# Patient Record
Sex: Female | Born: 1970
Health system: Southern US, Community
[De-identification: ages and names within clinical notes are randomized; demographics above are authoritative.]

## PROBLEM LIST (undated history)

## (undated) ENCOUNTER — Emergency Department (HOSPITAL_COMMUNITY): Admission: EM | Payer: Self-pay

## (undated) DIAGNOSIS — M1990 Unspecified osteoarthritis, unspecified site: Secondary | ICD-10-CM

## (undated) HISTORY — PX: TONSILLECTOMY: SUR1361

## (undated) HISTORY — PX: TUBAL LIGATION: SHX77

## (undated) HISTORY — PX: ABDOMINOPLASTY/PANNICULECTOMY: SHX5578

## (undated) HISTORY — PX: WISDOM TOOTH EXTRACTION: SHX21

## (undated) HISTORY — PX: CHOLECYSTECTOMY: SHX55

---

## 2000-05-10 ENCOUNTER — Other Ambulatory Visit: Admission: RE | Admit: 2000-05-10 | Discharge: 2000-05-10 | Payer: Self-pay | Admitting: Obstetrics and Gynecology

## 2000-11-30 ENCOUNTER — Emergency Department (HOSPITAL_COMMUNITY): Admission: EM | Admit: 2000-11-30 | Discharge: 2000-11-30 | Payer: Self-pay | Admitting: Emergency Medicine

## 2001-05-22 ENCOUNTER — Other Ambulatory Visit: Admission: RE | Admit: 2001-05-22 | Discharge: 2001-05-22 | Payer: Self-pay | Admitting: Obstetrics and Gynecology

## 2002-07-25 ENCOUNTER — Other Ambulatory Visit: Admission: RE | Admit: 2002-07-25 | Discharge: 2002-07-25 | Payer: Self-pay | Admitting: Obstetrics and Gynecology

## 2002-12-31 ENCOUNTER — Encounter: Payer: Self-pay | Admitting: Emergency Medicine

## 2002-12-31 ENCOUNTER — Emergency Department (HOSPITAL_COMMUNITY): Admission: EM | Admit: 2002-12-31 | Discharge: 2002-12-31 | Payer: Self-pay | Admitting: Emergency Medicine

## 2003-09-23 ENCOUNTER — Other Ambulatory Visit: Admission: RE | Admit: 2003-09-23 | Discharge: 2003-09-23 | Payer: Self-pay | Admitting: Obstetrics and Gynecology

## 2004-09-20 ENCOUNTER — Inpatient Hospital Stay (HOSPITAL_COMMUNITY): Admission: AD | Admit: 2004-09-20 | Discharge: 2004-09-21 | Payer: Self-pay | Admitting: Obstetrics & Gynecology

## 2004-09-25 ENCOUNTER — Inpatient Hospital Stay (HOSPITAL_COMMUNITY): Admission: AD | Admit: 2004-09-25 | Discharge: 2004-09-25 | Payer: Self-pay | Admitting: Obstetrics & Gynecology

## 2004-09-29 ENCOUNTER — Inpatient Hospital Stay (HOSPITAL_COMMUNITY): Admission: AD | Admit: 2004-09-29 | Discharge: 2004-10-01 | Payer: Self-pay | Admitting: Obstetrics and Gynecology

## 2004-09-29 ENCOUNTER — Encounter (INDEPENDENT_AMBULATORY_CARE_PROVIDER_SITE_OTHER): Payer: Self-pay | Admitting: *Deleted

## 2004-11-04 ENCOUNTER — Other Ambulatory Visit: Admission: RE | Admit: 2004-11-04 | Discharge: 2004-11-04 | Payer: Self-pay | Admitting: Obstetrics and Gynecology

## 2004-11-16 ENCOUNTER — Ambulatory Visit (HOSPITAL_COMMUNITY): Admission: RE | Admit: 2004-11-16 | Discharge: 2004-11-16 | Payer: Self-pay | Admitting: Obstetrics and Gynecology

## 2005-09-17 ENCOUNTER — Emergency Department (HOSPITAL_COMMUNITY): Admission: EM | Admit: 2005-09-17 | Discharge: 2005-09-17 | Payer: Self-pay | Admitting: Family Medicine

## 2007-07-24 ENCOUNTER — Emergency Department (HOSPITAL_COMMUNITY): Admission: EM | Admit: 2007-07-24 | Discharge: 2007-07-24 | Payer: Self-pay | Admitting: Emergency Medicine

## 2009-05-19 ENCOUNTER — Emergency Department (HOSPITAL_COMMUNITY): Admission: EM | Admit: 2009-05-19 | Discharge: 2009-05-19 | Payer: Self-pay | Admitting: Emergency Medicine

## 2009-05-27 ENCOUNTER — Ambulatory Visit: Payer: Self-pay | Admitting: Pulmonary Disease

## 2009-05-27 DIAGNOSIS — G4733 Obstructive sleep apnea (adult) (pediatric): Secondary | ICD-10-CM

## 2009-05-29 ENCOUNTER — Ambulatory Visit (HOSPITAL_BASED_OUTPATIENT_CLINIC_OR_DEPARTMENT_OTHER): Admission: RE | Admit: 2009-05-29 | Discharge: 2009-05-29 | Payer: Self-pay | Admitting: Pulmonary Disease

## 2009-05-29 ENCOUNTER — Encounter: Payer: Self-pay | Admitting: Pulmonary Disease

## 2009-06-14 ENCOUNTER — Ambulatory Visit: Payer: Self-pay | Admitting: Pulmonary Disease

## 2009-06-15 ENCOUNTER — Telehealth (INDEPENDENT_AMBULATORY_CARE_PROVIDER_SITE_OTHER): Payer: Self-pay | Admitting: *Deleted

## 2010-05-25 NOTE — Assessment & Plan Note (Signed)
Summary: self referral for nonrestorative sleep.   Copy to:  Self Referral Primary Provider/Referring Provider:  None  CC:  Sleep Consult.  History of Present Illness: The pt is a 40y/o female who I have been asked to see for nonrestorative sleep.  She has had issues for a few years, but it has gotten signficantly worse since Dec. of last year.  She has been noted to have loud snoring during sleep, but no one has mentioned pauses.  She does have intermittant choking arousals.  She goes to bed btw 8-10pm, and arises at 5:30am to start her day.  She does not feel rested in the am's upon arising, no matter how long she sleeps.  She also notes frequent awakenings, and feels that she is always aware of her surroundings during sleep.  She denies any hx suggestive of RLS.  She works as a Runner, broadcasting/film/video, and has very little "down time".  However, she clearly has sleep pressure with periods of inactivity during the day.  She also can doze with tv or movies in the evening.  She denies any sleepiness issues with driving.  Her epworth score today is 11, and she tells me that her weight is up about 10 pounds over the past 2 years.  Preventive Screening-Counseling & Management  Alcohol-Tobacco     Smoking Status: never  Current Medications (verified): 1)  None  Allergies (verified): No Known Drug Allergies  Past History:  Past Medical History: none per pt  Past Surgical History: gallbladder 1993  Family History: Reviewed history and no changes required. heart disease: father (m.i.)  Social History: Reviewed history and no changes required. Patient never smoked.  pt is single. pt has children. pt works as a Runner, broadcasting/film/video.  Smoking Status:  never  Review of Systems       The patient complains of difficulty swallowing, sore throat, and nasal congestion/difficulty breathing through nose.  The patient denies shortness of breath with activity, shortness of breath at rest, productive cough, non-productive  cough, coughing up blood, chest pain, irregular heartbeats, acid heartburn, indigestion, loss of appetite, weight change, abdominal pain, tooth/dental problems, headaches, sneezing, itching, ear ache, anxiety, depression, hand/feet swelling, joint stiffness or pain, rash, change in color of mucus, and fever.    Vital Signs:  Patient profile:   40 year old female Height:      65 inches Weight:      272.25 pounds BMI:     45.47 O2 Sat:      99 % on Room air Temp:     98.0 degrees F oral Pulse rate:   75 / minute BP sitting:   110 / 70  (left arm) Cuff size:   large  Vitals Entered By: Arman Filter LPN (May 27, 2009 3:27 PM)  O2 Flow:  Room air CC: Sleep Consult Comments Medications reviewed with patient Arman Filter LPN  May 27, 2009 3:27 PM    Physical Exam  General:  obese female in nad Eyes:  PERRLA and EOMI.   Nose:  patent without discharge Mouth:  narrowing posteriorly, elongation of soft palate and uvula Neck:  no jvd, tmg, LN Lungs:  clear to auscultation Heart:  rrr, no mrg Abdomen:  soft and nontender,bs + Extremities:  no edema or cyanosis pulses intact distally Neurologic:  alert and oriented, moves all 4.   Impression & Recommendations:  Problem # 1:  OBSTRUCTIVE SLEEP APNEA (ICD-327.23) the pt's history is very suggestive of osa.  She is obese,  has loud snoring with choking arousals, and has nonrestorative sleep.  I have discussed with her the pathophysiology of osa, including its impact on her QOL and CV health.  I think she needs a sleep study for diagnosis, and would also check TSH given her recent worsening of symptoms.  She is agreeable to this approach.  Medications Added to Medication List This Visit: 1)  None   Other Orders: TLB-TSH (Thyroid Stimulating Hormone) (02725-DGU) New Patient Level IV (44034) Sleep Disorder Referral (Sleep Disorder)  Patient Instructions: 1)  will setup for sleep study, and call when results are  available 2)  will check thyroid studies today

## 2010-05-25 NOTE — Progress Notes (Signed)
Summary: need to sched ov with kc   ---- Converted from flag ---- ---- 06/15/2009 1:56 PM, Arman Filter LPN wrote: pt needs ov with kc to discuss sleep study results. ------------------------------  [id number].  Aundra Millet Jodi Criscuolo LPN  June 15, 2009 1:56 PM   called and spoke with pt.  pt scheduled to see kc friday 06-19-2009 at 3 :30 pm. Arman Filter LPN  June 16, 2009 3:30 PM

## 2010-06-23 ENCOUNTER — Other Ambulatory Visit (HOSPITAL_COMMUNITY): Payer: Self-pay | Admitting: Surgery

## 2010-06-30 ENCOUNTER — Encounter: Payer: BC Managed Care – PPO | Attending: Surgery | Admitting: *Deleted

## 2010-06-30 ENCOUNTER — Ambulatory Visit (HOSPITAL_COMMUNITY)
Admission: RE | Admit: 2010-06-30 | Discharge: 2010-06-30 | Disposition: A | Discharge status: Home / Self Care | Payer: BC Managed Care – PPO | Source: Ambulatory Visit | Attending: Surgery | Admitting: Surgery

## 2010-06-30 DIAGNOSIS — Z713 Dietary counseling and surveillance: Secondary | ICD-10-CM | POA: Insufficient documentation

## 2010-06-30 DIAGNOSIS — Z01818 Encounter for other preprocedural examination: Secondary | ICD-10-CM | POA: Insufficient documentation

## 2010-07-05 ENCOUNTER — Other Ambulatory Visit (HOSPITAL_COMMUNITY): Payer: Self-pay

## 2010-07-05 ENCOUNTER — Ambulatory Visit (HOSPITAL_COMMUNITY): Payer: BC Managed Care – PPO

## 2010-07-06 ENCOUNTER — Ambulatory Visit (HOSPITAL_COMMUNITY)
Admit: 2010-07-06 | Discharge: 2010-07-06 | Disposition: A | Discharge status: Home / Self Care | Payer: BC Managed Care – PPO | Attending: Surgery | Admitting: Surgery

## 2010-07-06 DIAGNOSIS — Z6841 Body Mass Index (BMI) 40.0 and over, adult: Secondary | ICD-10-CM | POA: Insufficient documentation

## 2010-07-06 DIAGNOSIS — K219 Gastro-esophageal reflux disease without esophagitis: Secondary | ICD-10-CM | POA: Insufficient documentation

## 2010-08-26 ENCOUNTER — Encounter: Payer: BC Managed Care – PPO | Attending: Surgery

## 2010-08-26 DIAGNOSIS — Z713 Dietary counseling and surveillance: Secondary | ICD-10-CM | POA: Insufficient documentation

## 2010-08-26 DIAGNOSIS — Z01818 Encounter for other preprocedural examination: Secondary | ICD-10-CM | POA: Insufficient documentation

## 2010-09-10 NOTE — Op Note (Signed)
Sandra Bird, Sandra Bird              ACCOUNT NO.:  [id number]   MEDICAL RECORD NO.:  [id number]          PATIENT TYPE:  AMB   LOCATION:  SDC                           FACILITY:  [facility]   PHYSICIAN:  Randye Lobo, M.D.   DATE OF BIRTH:  March 23, 1971   DATE OF PROCEDURE:  11/16/2004  DATE OF DISCHARGE:                                 OPERATIVE REPORT   PREOPERATIVE DIAGNOSIS:  1.  Multiparous female.  2.  Desire for permanent sterilization.   POSTOPERATIVE DIAGNOSIS:  1.  Multiparous female.  2.  Desire for permanent sterilization.   PROCEDURE:  Laparoscopic bilateral tubal ligation by fulguration.   ANESTHESIA:  General endotracheal, local with 0.25% Marcaine.   IV FLUIDS:  1000 mL Ringer's lactate.   ESTIMATED BLOOD LOSS:  Minimal.   URINE OUTPUT:  100 mL by I&O catheterization prior to procedure.   COMPLICATIONS:  None.   SURGEON:  Randye Lobo, M.D.   INDICATIONS FOR PROCEDURE:  The patient is a 40 year old para 3 African-  American female, status post normal spontaneous vaginal delivery on October 01, 2003 who during her pregnancy and postpartum requested permanent  sterilization. The patient declined reversible forms of contraception. The  patient chose to proceed with permanent sterilization after risks, benefits,  and alternatives were discussed with her. A failure rate of approximately 1  in 250 to 1 in 300 of the procedure was discussed with the patient along  with potential risk of ectopic pregnancy. She again chose to proceed with  the surgery.   FINDINGS:  At the time of laparoscopy, the patient had a normal-appearing  uterus, bilateral tubes, and ovaries. The patient had a normal-appearing  appendix and liver. There was no evidence of any adhesive disease nor  endometriosis visible in the abdomen or pelvis.   SPECIMENS:  None.   PROCEDURE:  The patient was identified in the preoperative hold area. She  did receive clindamycin 900 milligrams IV for antibiotic  prophylaxis. The  patient in the operating room received general endotracheal. She was then  placed in the dorsal lithotomy position. The abdomen and vagina were  sterilely prepped and the patient was catheterized of urine with a red  rubber catheter. She was sterilely draped.   A 1 cm umbilical incision was created sharply with the scalpel along the  line of a previous umbilical incision. This was carried down to the fascia  using an Allis clamp. A 10 mm trocar was then inserted directly into the  peritoneal cavity without difficulty. The laparoscope confirmed proper  placement and a pneumoperitoneum was then achieved with CO2 gas. The patient  was placed in a Trendelenburg position. A 5 mm suprapubic incision was then  created with the scalpel, and a 5 mm trocar was placed under direct  visualization of laparoscope. An inspection of the pelvic and abdominal  organs was performed and the findings were as noted above.   The left fallopian tube was grasped and followed all the way to its  fimbriated end. It was then coagulated with the Kleppinger bipolar forceps  along a contiguous length  of 3 cm along the isthmic portion of the fallopian  tube. There was excellent blanching of the fallopian tube extending into the  mesosalpinx. The same procedure that was performed on the left-hand side was  then repeated on the right-hand side after that fallopian tube was grasped  and followed all the way to its fimbriated end. Hemostasis was excellent at  the termination of the procedure. The suprapubic trocar was removed under  visualization of the laparoscope. The pneumoperitoneum was released and then  the 10 mm laparoscope and the umbilical trocar were removed simultaneously.   All incisions were closed with subcuticular sutures of 3-0 plain. The  incisions were covered with Steri-Strips and Band-Aids. The Hulka tenaculum  which had been placed at the beginning of the procedure was removed.    The patient was awakened and extubated and taken to the recovery room in  stable and awake condition. There were no complications to the procedure.  All needle, instrument, sponge counts were correct.      Randye Lobo, M.D.  Electronically Signed     BES/MEDQ  D:  11/16/2004  T:  11/16/2004  Job:  [id number]

## 2011-01-17 LAB — INFLUENZA A AND B ANTIGEN (CONVERTED LAB)

## 2011-03-26 ENCOUNTER — Ambulatory Visit (HOSPITAL_COMMUNITY): Admission: RE | Admit: 2011-03-26 | Payer: BC Managed Care – PPO | Source: Ambulatory Visit | Admitting: Surgery

## 2011-04-20 ENCOUNTER — Encounter: Payer: Self-pay | Admitting: *Deleted

## 2011-04-20 ENCOUNTER — Emergency Department (HOSPITAL_COMMUNITY): Payer: BC Managed Care – PPO

## 2011-04-20 ENCOUNTER — Other Ambulatory Visit: Payer: Self-pay

## 2011-04-20 ENCOUNTER — Emergency Department (HOSPITAL_COMMUNITY)
Admission: EM | Admit: 2011-04-20 | Discharge: 2011-04-21 | Disposition: A | Discharge status: Home / Self Care | Payer: BC Managed Care – PPO | Attending: Emergency Medicine | Admitting: Emergency Medicine

## 2011-04-20 DIAGNOSIS — R0602 Shortness of breath: Secondary | ICD-10-CM | POA: Insufficient documentation

## 2011-04-20 DIAGNOSIS — R05 Cough: Secondary | ICD-10-CM | POA: Insufficient documentation

## 2011-04-20 DIAGNOSIS — R079 Chest pain, unspecified: Secondary | ICD-10-CM | POA: Insufficient documentation

## 2011-04-20 DIAGNOSIS — J189 Pneumonia, unspecified organism: Secondary | ICD-10-CM | POA: Insufficient documentation

## 2011-04-20 DIAGNOSIS — R059 Cough, unspecified: Secondary | ICD-10-CM | POA: Insufficient documentation

## 2011-04-20 DIAGNOSIS — E669 Obesity, unspecified: Secondary | ICD-10-CM | POA: Insufficient documentation

## 2011-04-20 DIAGNOSIS — IMO0001 Reserved for inherently not codable concepts without codable children: Secondary | ICD-10-CM | POA: Insufficient documentation

## 2011-04-20 LAB — DIFFERENTIAL
Basophils Absolute: 0 10*3/uL (ref 0.0–0.1)
Eosinophils Absolute: 0 10*3/uL (ref 0.0–0.7)
Lymphocytes Relative: 6 % — ABNORMAL LOW (ref 12–46)
Lymphs Abs: 1.4 10*3/uL (ref 0.7–4.0)
Neutrophils Relative %: 90 % — ABNORMAL HIGH (ref 43–77)

## 2011-04-20 LAB — CBC
MCH: 26.8 pg (ref 26.0–34.0)
Platelets: 277 10*3/uL (ref 150–400)
RBC: 4.51 MIL/uL (ref 3.87–5.11)
WBC: 23.2 10*3/uL — ABNORMAL HIGH (ref 4.0–10.5)

## 2011-04-20 LAB — BASIC METABOLIC PANEL
GFR calc non Af Amer: >90 mL/min (ref >90)
Glucose, Bld: 115 mg/dL — ABNORMAL HIGH (ref 70–99)
Potassium: 3 mEq/L — ABNORMAL LOW (ref 3.5–5.1)
Sodium: 132 mEq/L — ABNORMAL LOW (ref 135–145)

## 2011-04-20 MED ORDER — ACETAMINOPHEN 325 MG PO TABS
650.0000 mg | ORAL_TABLET | Freq: Once | ORAL | Status: AC
  Administered 2011-04-20: 650 mg via ORAL

## 2011-04-20 MED ORDER — IOHEXOL 300 MG/ML  SOLN
100.0000 mL | Freq: Once | INTRAMUSCULAR | Status: AC | PRN
  Administered 2011-04-20: 100 mL via INTRAVENOUS

## 2011-04-20 MED ORDER — AZITHROMYCIN 250 MG PO TABS: ORAL_TABLET | ORAL | Status: AC

## 2011-04-20 MED ORDER — ALBUTEROL SULFATE (5 MG/ML) 0.5% IN NEBU
5.0000 mg | INHALATION_SOLUTION | Freq: Once | RESPIRATORY_TRACT | Status: AC
  Administered 2011-04-20: 5 mg via RESPIRATORY_TRACT
  Filled 2011-04-20: qty 1

## 2011-04-20 MED ORDER — ACETAMINOPHEN 325 MG PO TABS
ORAL_TABLET | ORAL | Status: AC
  Administered 2011-04-20: 650 mg via ORAL
  Filled 2011-04-20: qty 2

## 2011-04-20 MED ORDER — IPRATROPIUM BROMIDE 0.02 % IN SOLN
0.5000 mg | Freq: Once | RESPIRATORY_TRACT | Status: AC
  Administered 2011-04-20: 0.5 mg via RESPIRATORY_TRACT
  Filled 2011-04-20: qty 2.5

## 2011-04-20 MED ORDER — SODIUM CHLORIDE 0.9 % IV BOLUS (SEPSIS)
1000.0000 mL | Freq: Once | INTRAVENOUS | Status: AC
  Administered 2011-04-20: 1000 mL via INTRAVENOUS

## 2011-04-20 MED ORDER — CEFTRIAXONE SODIUM 1 G IJ SOLR
1.0000 g | Freq: Once | INTRAMUSCULAR | Status: AC
  Administered 2011-04-20: 1 g via INTRAVENOUS
  Filled 2011-04-20: qty 10

## 2011-04-20 MED ORDER — POTASSIUM CHLORIDE CRYS ER 20 MEQ PO TBCR
40.0000 meq | EXTENDED_RELEASE_TABLET | Freq: Once | ORAL | Status: AC
  Administered 2011-04-21: 40 meq via ORAL
  Filled 2011-04-20: qty 2

## 2011-04-20 MED ORDER — DEXTROSE 5 % IV SOLN
500.0000 mg | Freq: Once | INTRAVENOUS | Status: AC
  Administered 2011-04-21: 500 mg via INTRAVENOUS
  Filled 2011-04-20: qty 500

## 2011-04-20 NOTE — ED Notes (Signed)
Pt states "I've had a cough x 1 month, was told @ [facility] she may have a sinus infection"; pt c/o mid-sternal cp, with radiation into left neck/LUE/left back, pt denies n/v

## 2011-04-20 NOTE — ED Provider Notes (Signed)
History     CSN: [id number]  Arrival date & time 04/20/11  1735   First MD Initiated Contact with Patient 04/20/11 2006      Chief Complaint  Patient presents with  . Cough     HPI  History provided by the patient and spouse. Patient is a 40 year old female who presents with complaints of persistent cough for the past month. Patient has had associated congestion and nasal drainage. Patient reports symptoms becoming much worse 2 days ago with fever, chills, bodyaches and chest pains. Chest pain is worse with coughing, laughing or deep breathing. Pain runs through the left upper chest and left shoulder and neck. Pain as a soreness and cramping. Pain was better with muscle rub and heat. Patient has also been taking TheraFlu and over-the-counter cough medicines with no improvement of cough symptoms. 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 Patient denies any other significant past medical history.    History reviewed. No pertinent past medical history.  Past Surgical History  Procedure Date  . Cholecystectomy   . Tuval ligation   . Tonsillectomy     No family history on file.  History  Substance Use Topics  . Smoking status: Never Smoker   . Smokeless tobacco: Not on file  . Alcohol Use: Yes     socially    OB History    Grav Para Term Preterm Abortions TAB SAB Ect Mult Living                  Review of Systems  Constitutional: Positive for fever, chills, appetite change and fatigue.  HENT: Positive for congestion, sore throat and rhinorrhea.   Respiratory: Positive for cough. Negative for shortness of breath.   Cardiovascular: Positive for chest pain. Negative for palpitations.  Gastrointestinal: Negative for vomiting, abdominal pain, diarrhea and constipation.  Genitourinary: Negative for dysuria, frequency, hematuria, flank pain and pelvic pain.  Musculoskeletal: Positive for myalgias.  Neurological: Negative for  dizziness and light-headedness.  All other systems reviewed and are negative.    Allergies  Penicillins  Home Medications   Current Outpatient Rx  Name Route Sig Dispense Refill  . CALCIUM + D PO Oral Take 1 tablet by mouth daily.      . ADULT MULTIVITAMIN W/MINERALS CH Oral Take 1 tablet by mouth daily.        BP 137/58  Pulse 106  Temp(Src) 101.3 F (38.5 C) (Oral)  Resp 22  Wt 213 lb (96.616 kg)  SpO2 98%  LMP 03/31/2011  Physical Exam  Nursing note and vitals reviewed. Constitutional: She is oriented to person, place, and time. She appears well-developed and well-nourished. No distress.  HENT:  Head: Normocephalic.  Mouth/Throat: Oropharynx is clear and moist.       Rhinorrhea  Neck: Normal range of motion. Neck supple.       No meningeal sign  Cardiovascular: Regular rhythm.  Tachycardia present.   Pulmonary/Chest: Breath sounds normal. Tachypnea noted. She has no wheezes. She has no rales.       Reproducible left upper chest, trapezius and shoulder tenderness to palpation. No deformity, crepitus or step-off of ribs.  Abdominal: Soft. There is no tenderness. There is no rebound and no guarding.       Obese  Musculoskeletal: She exhibits no edema and no tenderness.  Neurological: She is alert and oriented to person, place, and time.  Skin: Skin is warm and dry. No rash noted.  Psychiatric: She has a normal mood and affect. Her behavior is normal.    ED Course  Procedures (including critical care time)   Labs Reviewed  CBC  DIFFERENTIAL  BASIC METABOLIC PANEL   Results for orders placed during the hospital encounter of 04/20/11  CBC      Component Value Range   WBC 23.2 (*) 4.0 - 10.5 (K/uL)   RBC 4.51  3.87 - 5.11 (MIL/uL)   Hemoglobin 12.1  12.0 - 15.0 (g/dL)   HCT 78.2 (*) 95.6 - 46.0 (%)   MCV 79.2  78.0 - 100.0 (fL)   MCH 26.8  26.0 - 34.0 (pg)   MCHC 33.9  30.0 - 36.0 (g/dL)   RDW 21.3  08.6 - 57.8 (%)   Platelets 277  150 - 400 (K/uL)    DIFFERENTIAL      Component Value Range   Neutrophils Relative 90 (*) 43 - 77 (%)   Neutro Abs 20.8 (*) 1.7 - 7.7 (K/uL)   Lymphocytes Relative 6 (*) 12 - 46 (%)   Lymphs Abs 1.4  0.7 - 4.0 (K/uL)   Monocytes Relative 4  3 - 12 (%)   Monocytes Absolute 1.0  0.1 - 1.0 (K/uL)   Eosinophils Relative 0  0 - 5 (%)   Eosinophils Absolute 0.0  0.0 - 0.7 (K/uL)   Basophils Relative 0  0 - 1 (%)   Basophils Absolute 0.0  0.0 - 0.1 (K/uL)  BASIC METABOLIC PANEL      Component Value Range   Sodium 132 (*) 135 - 145 (mEq/L)   Potassium 3.0 (*) 3.5 - 5.1 (mEq/L)   Chloride 98  96 - 112 (mEq/L)   CO2 23  19 - 32 (mEq/L)   Glucose, Bld 115 (*) 70 - 99 (mg/dL)   BUN 9  6 - 23 (mg/dL)   Creatinine, Ser 4.69  0.50 - 1.10 (mg/dL)   Calcium 9.7  8.4 - 62.9 (mg/dL)   GFR calc non Af Amer >90  >90 (mL/min)   GFR calc Af Amer >90  >90 (mL/min)      Dg Chest 2 View  04/20/2011  *RADIOLOGY REPORT*  Clinical Data: Cough, chest pain, shortness of breath  CHEST - 2 VIEW  Comparison: 07/06/2010  Findings: Lungs are essentially clear.  No pleural effusion or pneumothorax.  Heart is top normal in size for inspiration.  Visualized osseous structures are within normal limits.  IMPRESSION: No evidence of acute cardiopulmonary disease.  Original Report Authenticated By: Charline Bills, M.D.   Ct Angio Chest W/cm &/or Wo Cm  04/20/2011  *RADIOLOGY REPORT*  Clinical Data:  Cough for 1 month; chest pain.  CT ANGIOGRAPHY CHEST WITH CONTRAST  Technique:  Multidetector CT imaging of the chest was performed using the standard protocol during bolus administration of intravenous contrast.  Multiplanar CT image reconstructions including MIPs were obtained to evaluate the vascular anatomy.  Contrast: OMNIPAQUE IOHEXOL 300 MG/ML IV SOLN  Comparison:  Chest radiograph performed earlier today at 08:35 p.m.  Findings:  There is no evidence of significant pulmonary embolus. Evaluation for pulmonary embolus is suboptimal in  areas of airspace consolidation.  Dense consolidation involving the left lower lobe, compatible with left lower lobe pneumonia.  Mild patchy airspace opacity within the right lower lobe may reflect atelectasis or pneumonia.  Trace bilateral pleural effusions are noted.  There is no evidence of pneumothorax.  No masses are identified; no abnormal focal contrast enhancement is seen.  The mediastinum is  unremarkable in appearance.  No mediastinal lymphadenopathy is seen.  No pericardial effusion is identified. The great vessels are unremarkable in appearance.  No axillary lymphadenopathy is seen.  The visualized portions of the thyroid gland are unremarkable in appearance.  The visualized portions of the liver and spleen are unremarkable.  No acute osseous abnormalities are seen.  Review of the MIP images confirms the above findings.  IMPRESSION:  1.  No evidence of significant pulmonary embolus. 2.  Dense left lower lobe pneumonia noted; additional mild patchy airspace opacity within the right lower lobe may reflect atelectasis or pneumonia. 3.  Trace bilateral pleural effusions seen.  Original Report Authenticated By: Tonia Ghent, M.D.     1. CAP (community acquired pneumonia)       MDM  8:20PM patient seen and evaluated. Patient in no acute distress.   Pt continues to feel improvements.  CT with signs for PNA.  Will start tx here in ED.  Pt continues to maintain good O2 sats and normal respirations.  IV fluids have improved HR.  Pt feels ready to return home.  Pt advised of strict return instructions.     Angus Seller, [place] 04/21/11 [phone]

## 2011-04-20 NOTE — ED Notes (Signed)
Patient is resting comfortably.  Pt to BR

## 2011-04-20 NOTE — ED Notes (Signed)
States had a flu test at [facility]--neg per patient, left shoulder/chest/neck pain started yesterday--aching, hurts with coughing/movement. States coughing up blood tinged phlegm,  Wet, moist cough, .

## 2011-04-23 NOTE — ED Provider Notes (Signed)
Medical screening examination/treatment/procedure(s) were performed by non-physician practitioner and as supervising physician I was immediately available for consultation/collaboration.   Gerhard Munch, MD 04/23/11 [phone]

## 2012-02-04 ENCOUNTER — Encounter (HOSPITAL_COMMUNITY): Payer: Self-pay | Admitting: Emergency Medicine

## 2012-02-04 ENCOUNTER — Emergency Department (INDEPENDENT_AMBULATORY_CARE_PROVIDER_SITE_OTHER)
Admission: EM | Admit: 2012-02-04 | Discharge: 2012-02-04 | Disposition: A | Discharge status: Home / Self Care | Payer: BC Managed Care – PPO | Source: Home / Self Care

## 2012-02-04 DIAGNOSIS — J029 Acute pharyngitis, unspecified: Secondary | ICD-10-CM

## 2012-02-04 DIAGNOSIS — J329 Chronic sinusitis, unspecified: Secondary | ICD-10-CM

## 2012-02-04 LAB — POCT RAPID STREP A: Streptococcus, Group A Screen (Direct): NEGATIVE

## 2012-02-04 MED ORDER — AZITHROMYCIN 250 MG PO TABS: ORAL_TABLET | ORAL | Status: DC

## 2012-02-04 NOTE — ED Notes (Signed)
Pt c/o of sore throat, productive cough with bloody sputum, runny nose x3 days. Pt has tried thera flu, nyquil, and tylenol severe cold with no relief of symptoms. Pt denies n/v/d and fever.

## 2012-02-04 NOTE — ED Provider Notes (Signed)
History     CSN: [id number]  Arrival date & time 02/04/12  4098   None     Chief Complaint  Patient presents with  . Sore Throat    (Consider location/radiation/quality/duration/timing/severity/associated sxs/prior treatment) Patient is a 41 y.o. female presenting with pharyngitis. The history is provided by the patient. No language interpreter was used.  Sore Throat This is a new problem. The current episode started more than 2 days ago. The problem occurs constantly. The problem has been gradually worsening. Pertinent negatives include no shortness of breath. Nothing aggravates the symptoms. Nothing relieves the symptoms. She has tried nothing for the symptoms.  Pt reports sinus drainage, bloody  History reviewed. No pertinent past medical history.  Past Surgical History  Procedure Date  . Cholecystectomy   . Tuval ligation   . Tonsillectomy     History reviewed. No pertinent family history.  History  Substance Use Topics  . Smoking status: Never Smoker   . Smokeless tobacco: Not on file  . Alcohol Use: Yes     socially    OB History    Grav Para Term Preterm Abortions TAB SAB Ect Mult Living                  Review of Systems  Constitutional: Positive for fever.  HENT: Positive for congestion and rhinorrhea.   Respiratory: Negative for shortness of breath.   All other systems reviewed and are negative.    Allergies  Penicillins  Home Medications   Current Outpatient Rx  Name Route Sig Dispense Refill  . CALCIUM + D PO Oral Take 1 tablet by mouth daily.      . ADULT MULTIVITAMIN W/MINERALS CH Oral Take 1 tablet by mouth daily.        BP 143/74  Pulse 71  Temp 98.3 F (36.8 C) (Oral)  Resp 20  SpO2 100%  LMP 02/02/2012  Physical Exam  Constitutional: She is oriented to person, place, and time. She appears well-developed and well-nourished.  HENT:  Head: Normocephalic.       Erythematous throat,  Tender maxillary sinuses  Eyes:  Conjunctivae normal are normal. Pupils are equal, round, and reactive to light.  Neck: Normal range of motion. Neck supple.  Cardiovascular: Normal rate.   Pulmonary/Chest: Effort normal.  Abdominal: Soft.  Musculoskeletal: Normal range of motion.  Neurological: She is alert and oriented to person, place, and time. She has normal reflexes.  Skin: Skin is warm.  Psychiatric: She has a normal mood and affect.    ED Course  Procedures (including critical care time)   Labs Reviewed  POCT RAPID STREP A ([facility] CARE ONLY)   No results found.   1. Pharyngitis   2. Sinusitis       MDM  zithromax        Elson Areas, [place] 02/04/12 1045

## 2012-02-06 NOTE — ED Provider Notes (Signed)
Medical screening examination/treatment/procedure(s) were performed by non-physician practitioner and as supervising physician I was immediately available for consultation/collaboration.  Leslee Home, M.D.   Reuben Likes, MD 02/06/12 [phone]

## 2012-08-08 ENCOUNTER — Telehealth (HOSPITAL_COMMUNITY): Payer: Self-pay | Admitting: Radiology

## 2012-08-08 NOTE — Telephone Encounter (Signed)
Received call from patient's daughter stating that the stitch on patient's drain has come out.  Patient denies pain or fevers.  Patient due to have drain exchanged on 08-15-12.  I told the daughter that they could choose between coming in to have stitch replaced or waiting until next appointment.  Daughter said she would call back if they wanted to replace the stitch.  I instructed her that they needed to take extra care of the catheter and secure well with tape to protect catheter from getting pulled out.

## 2012-08-12 DIAGNOSIS — R7301 Impaired fasting glucose: Secondary | ICD-10-CM | POA: Insufficient documentation

## 2013-02-15 ENCOUNTER — Ambulatory Visit: Payer: Self-pay

## 2013-02-15 ENCOUNTER — Other Ambulatory Visit: Payer: Self-pay | Admitting: Occupational Medicine

## 2013-02-15 DIAGNOSIS — R52 Pain, unspecified: Secondary | ICD-10-CM

## 2013-02-15 DIAGNOSIS — M79672 Pain in left foot: Secondary | ICD-10-CM

## 2013-03-13 ENCOUNTER — Other Ambulatory Visit: Payer: Self-pay | Admitting: Obstetrics and Gynecology

## 2013-07-11 ENCOUNTER — Other Ambulatory Visit: Payer: Self-pay | Admitting: Obstetrics and Gynecology

## 2013-07-11 ENCOUNTER — Encounter (HOSPITAL_COMMUNITY): Payer: Self-pay | Admitting: *Deleted

## 2013-07-17 ENCOUNTER — Encounter (HOSPITAL_COMMUNITY): Payer: Self-pay | Admitting: Pharmacist

## 2013-07-24 NOTE — H&P (Signed)
Sandra Bird is an 43 y.o. female. With a history of prolonged and heavy periods which have not responded to medical therapy. Underwent sonohysterogram which did not reveal any intracavitary lesions which would explain the heavy flow. [facility] for both diagnostic and therapeutic reason.  Pertinent Gynecological History: Menses: regular every month with spotting approximately 10 days per month Bleeding: dysfunctional uterine bleeding Contraception: tubal ligation Previous GYN Procedures: sonohysterogram   OB History: G4, P3013  Menstrual History:  Patient's last menstrual period was 07/04/2013.    Past Medical History  Diagnosis Date  . SVD (spontaneous vaginal delivery)     x 3    Past Surgical History  Procedure Laterality Date  . Cholecystectomy    . Tonsillectomy    . Tubal ligation    . Wisdom tooth extraction      History reviewed. No pertinent family history.  Social History:  reports that she has never smoked. She has never used smokeless tobacco. She reports that she drinks alcohol. She reports that she does not use illicit drugs.  Allergies:  Allergies  Allergen Reactions  . Penicillins Rash    No prescriptions prior to admission    Review of Systems  Constitutional: Negative for fever and chills.  Respiratory: Negative for cough, hemoptysis, sputum production and shortness of breath.   Cardiovascular: Negative for chest pain, palpitations, orthopnea and claudication.  Gastrointestinal: Negative for heartburn, nausea, vomiting and abdominal pain.  Genitourinary: Negative for dysuria, urgency, frequency and hematuria.    Height 5\' 5"  (1.651 m), weight 132.45 kg (292 lb), last menstrual period 07/04/2013. Physical Exam  Constitutional: She appears well-developed and well-nourished.  HENT:  Head: Normocephalic and atraumatic.  Eyes: Conjunctivae and EOM are normal. Pupils are equal,  round, and reactive to light.  Neck: No tracheal deviation present. No thyromegaly present.  Cardiovascular: Normal rate, regular rhythm and normal heart sounds.   Respiratory: Effort normal and breath sounds normal.  GI: Soft. Bowel sounds are normal. She exhibits no distension and no mass. There is no tenderness. There is no rebound and no guarding.  Genitourinary: Pelvic exam was performed with patient supine. There is no rash, tenderness, lesion or injury on the right labia. There is no rash, tenderness, lesion or injury on the left labia. Uterus is not enlarged. Cervix exhibits no motion tenderness and no discharge. Right adnexum displays no mass, no tenderness and no fullness. Left adnexum displays no mass, no tenderness and no fullness. There is bleeding around the vagina. No erythema or tenderness around the vagina. No signs of injury around the vagina. No vaginal discharge found.    No results found for this or any previous visit (from the past 24 hour(s)).  No results found.  Assessment/Plan: Menometrorrhagia with negative sonohysterogram. Will proceed with D&C hysteroscopy and endometrial ablation.  Risks and benefits have been discussed and informed consent has been obtained.  Keidra Withers 07/24/2013, 5:40 PM

## 2013-07-25 ENCOUNTER — Encounter (HOSPITAL_COMMUNITY): Payer: BC Managed Care – PPO | Admitting: Certified Registered"

## 2013-07-25 ENCOUNTER — Ambulatory Visit (HOSPITAL_COMMUNITY)
Admission: RE | Admit: 2013-07-25 | Discharge: 2013-07-25 | Disposition: A | Discharge status: Home / Self Care | Payer: BC Managed Care – PPO | Source: Ambulatory Visit | Attending: Obstetrics and Gynecology | Admitting: Obstetrics and Gynecology

## 2013-07-25 ENCOUNTER — Encounter (HOSPITAL_COMMUNITY): Payer: Self-pay | Admitting: *Deleted

## 2013-07-25 ENCOUNTER — Encounter (HOSPITAL_COMMUNITY)
Admission: RE | Disposition: A | Discharge status: Home / Self Care | Payer: Self-pay | Source: Ambulatory Visit | Attending: Obstetrics and Gynecology

## 2013-07-25 ENCOUNTER — Ambulatory Visit (HOSPITAL_COMMUNITY): Payer: BC Managed Care – PPO | Admitting: Certified Registered"

## 2013-07-25 DIAGNOSIS — N920 Excessive and frequent menstruation with regular cycle: Secondary | ICD-10-CM | POA: Insufficient documentation

## 2013-07-25 DIAGNOSIS — N921 Excessive and frequent menstruation with irregular cycle: Secondary | ICD-10-CM

## 2013-07-25 DIAGNOSIS — N926 Irregular menstruation, unspecified: Secondary | ICD-10-CM | POA: Insufficient documentation

## 2013-07-25 DIAGNOSIS — M1990 Unspecified osteoarthritis, unspecified site: Secondary | ICD-10-CM | POA: Insufficient documentation

## 2013-07-25 DIAGNOSIS — M129 Arthropathy, unspecified: Secondary | ICD-10-CM | POA: Insufficient documentation

## 2013-07-25 HISTORY — PX: DILITATION & CURRETTAGE/HYSTROSCOPY WITH NOVASURE ABLATION: SHX5568

## 2013-07-25 LAB — CBC
HCT: 39.9 % (ref 36.0–46.0)
Hemoglobin: 13 g/dL (ref 12.0–15.0)
MCH: 26.9 pg (ref 26.0–34.0)
MCHC: 32.6 g/dL (ref 30.0–36.0)
MCV: 82.4 fL (ref 78.0–100.0)
PLATELETS: 287 10*3/uL (ref 150–400)
RBC: 4.84 MIL/uL (ref 3.87–5.11)
RDW: 14.3 % (ref 11.5–15.5)
WBC: 9.5 10*3/uL (ref 4.0–10.5)

## 2013-07-25 SURGERY — DILATATION & CURETTAGE/HYSTEROSCOPY WITH NOVASURE ABLATION
Anesthesia: General | Site: Vagina

## 2013-07-25 MED ORDER — LIDOCAINE HCL 1 % IJ SOLN
INTRAMUSCULAR | Status: AC
Start: 2013-07-25 — End: 2013-07-25
  Filled 2013-07-25: qty 20

## 2013-07-25 MED ORDER — LIDOCAINE HCL (CARDIAC) 20 MG/ML IV SOLN
INTRAVENOUS | Status: DC | PRN
  Administered 2013-07-25: 80 mg via INTRAVENOUS

## 2013-07-25 MED ORDER — MIDAZOLAM HCL 2 MG/2ML IJ SOLN
INTRAMUSCULAR | Status: DC | PRN
  Administered 2013-07-25: 2 mg via INTRAVENOUS

## 2013-07-25 MED ORDER — DEXAMETHASONE SODIUM PHOSPHATE 10 MG/ML IJ SOLN
INTRAMUSCULAR | Status: DC | PRN
  Administered 2013-07-25: 10 mg via INTRAVENOUS

## 2013-07-25 MED ORDER — KETOROLAC TROMETHAMINE 30 MG/ML IJ SOLN
INTRAMUSCULAR | Status: AC
Start: 2013-07-25 — End: 2013-07-25
  Filled 2013-07-25: qty 1

## 2013-07-25 MED ORDER — FENTANYL CITRATE 0.05 MG/ML IJ SOLN
25.0000 ug | INTRAMUSCULAR | Status: DC | PRN
  Administered 2013-07-25: 50 ug via INTRAVENOUS

## 2013-07-25 MED ORDER — DEXAMETHASONE SODIUM PHOSPHATE 10 MG/ML IJ SOLN
INTRAMUSCULAR | Status: AC
  Filled 2013-07-25: qty 1

## 2013-07-25 MED ORDER — LIDOCAINE HCL (CARDIAC) 20 MG/ML IV SOLN
INTRAVENOUS | Status: AC
  Filled 2013-07-25: qty 5

## 2013-07-25 MED ORDER — KETOROLAC TROMETHAMINE 30 MG/ML IJ SOLN
INTRAMUSCULAR | Status: DC | PRN
  Administered 2013-07-25: 30 mg via INTRAVENOUS

## 2013-07-25 MED ORDER — PROPOFOL 10 MG/ML IV BOLUS
INTRAVENOUS | Status: DC | PRN
  Administered 2013-07-25: 200 mg via INTRAVENOUS

## 2013-07-25 MED ORDER — MEPERIDINE HCL 25 MG/ML IJ SOLN: 6.2500 mg | INTRAMUSCULAR | Status: DC | PRN

## 2013-07-25 MED ORDER — METOCLOPRAMIDE HCL 5 MG/ML IJ SOLN: 10.0000 mg | Freq: Once | INTRAMUSCULAR | Status: DC | PRN

## 2013-07-25 MED ORDER — FENTANYL CITRATE 0.05 MG/ML IJ SOLN
INTRAMUSCULAR | Status: AC
  Filled 2013-07-25: qty 2

## 2013-07-25 MED ORDER — ONDANSETRON HCL 4 MG/2ML IJ SOLN
INTRAMUSCULAR | Status: AC
  Filled 2013-07-25: qty 2

## 2013-07-25 MED ORDER — LACTATED RINGERS IV SOLN
INTRAVENOUS | Status: DC
  Administered 2013-07-25: 1000 mL via INTRAVENOUS

## 2013-07-25 MED ORDER — ONDANSETRON HCL 4 MG/2ML IJ SOLN
INTRAMUSCULAR | Status: DC | PRN
  Administered 2013-07-25: 4 mg via INTRAVENOUS

## 2013-07-25 MED ORDER — MIDAZOLAM HCL 2 MG/2ML IJ SOLN
INTRAMUSCULAR | Status: AC
  Filled 2013-07-25: qty 2

## 2013-07-25 MED ORDER — PROPOFOL 10 MG/ML IV EMUL
INTRAVENOUS | Status: AC
  Filled 2013-07-25: qty 20

## 2013-07-25 MED ORDER — FENTANYL CITRATE 0.05 MG/ML IJ SOLN
INTRAMUSCULAR | Status: DC | PRN
  Administered 2013-07-25 (×4): 50 ug via INTRAVENOUS

## 2013-07-25 MED ORDER — LACTATED RINGERS IR SOLN
Status: DC | PRN
  Administered 2013-07-25: 3000 mL

## 2013-07-25 MED ORDER — LIDOCAINE HCL 1 % IJ SOLN
INTRAMUSCULAR | Status: DC | PRN
  Administered 2013-07-25: 10 mL

## 2013-07-25 SURGICAL SUPPLY — 21 items
ABLATOR ENDOMETRIAL BIPOLAR (ABLATOR) ×3 IMPLANT
CATH ROBINSON RED A/P 16FR (CATHETERS) ×3 IMPLANT
CLOTH BEACON ORANGE TIMEOUT ST (SAFETY) ×3 IMPLANT
CONTAINER PREFILL 10% NBF 60ML (FORM) ×6 IMPLANT
DRAPE HYSTEROSCOPY (DRAPE) ×3 IMPLANT
DRSG TELFA 3X8 NADH (GAUZE/BANDAGES/DRESSINGS) ×3 IMPLANT
GLOVE BIO SURGEON STRL SZ7.5 (GLOVE) ×3 IMPLANT
GLOVE BIOGEL PI IND STRL 7.5 (GLOVE) ×1 IMPLANT
GLOVE BIOGEL PI INDICATOR 7.5 (GLOVE) ×2
GOWN STRL REUS W/TWL 2XL LVL3 (GOWN DISPOSABLE) ×3 IMPLANT
GOWN STRL REUS W/TWL LRG LVL3 (GOWN DISPOSABLE) ×6 IMPLANT
NEEDLE SPNL 22GX3.5 QUINCKE BK (NEEDLE) ×3 IMPLANT
PACK VAGINAL MINOR WOMEN LF (CUSTOM PROCEDURE TRAY) ×3 IMPLANT
PAD DRESSING TELFA 3X8 NADH (GAUZE/BANDAGES/DRESSINGS) ×1 IMPLANT
PAD OB MATERNITY 4.3X12.25 (PERSONAL CARE ITEMS) ×3 IMPLANT
PAD PREP 24X48 CUFFED NSTRL (MISCELLANEOUS) ×3 IMPLANT
SET TUBING HYSTEROSCOPY 2 NDL (TUBING) ×2 IMPLANT
SYR CONTROL 10ML LL (SYRINGE) ×3 IMPLANT
TOWEL OR 17X24 6PK STRL BLUE (TOWEL DISPOSABLE) ×6 IMPLANT
TUBE HYSTEROSCOPY W Y-CONNECT (TUBING) ×2 IMPLANT
WATER STERILE IRR 1000ML POUR (IV SOLUTION) ×3 IMPLANT

## 2013-07-25 NOTE — Brief Op Note (Signed)
07/25/2013  7:45 AM  PATIENT:  Sandra LambertAnnie M Bird  43 y.o. female  PRE-OPERATIVE DIAGNOSIS:  MENORRHAGIA  POST-OPERATIVE DIAGNOSIS:  Menorrhagia  PROCEDURE:  Procedure(s): DILATATION & CURETTAGE/HYSTEROSCOPY WITH NOVASURE ABLATION (N/A)  SURGEON:  Surgeon(s) and Role:    * Miguel AschoffAllan Demetrie Borge, MD - Primary    ANESTHESIA:   general  EBL:     BLOOD ADMINISTERED:none  DRAINS: none   LOCAL MEDICATIONS USED:  LIDOCAINE   SPECIMEN:  Source of Specimen:  Endometrial currettings  DISPOSITION OF SPECIMEN:  PATHOLOGY  COUNTS:  YES  TOURNIQUET:  * No tourniquets in log *  DICTATION: .Other Dictation: Dictation Number [id number]  PLAN OF CARE: Discharge to home after PACU  PATIENT DISPOSITION:  PACU - hemodynamically stable.   Delay start of Pharmacological VTE agent (>24hrs) due to surgical blood loss or risk of bleeding: not applicable

## 2013-07-25 NOTE — Op Note (Signed)
[name], Bird NO.:  [id number]  MEDICAL RECORD NO.:  [id number]  LOCATION:  [facility]                          FACILITY:  [facility]  PHYSICIAN:  Miguel Aschoff, M.D.       DATE OF BIRTH:  [birth year]  DATE OF PROCEDURE:  07/25/2013 DATE OF DISCHARGE:                              OPERATIVE REPORT   PREOPERATIVE DIAGNOSIS:  Menometrorrhagia.  POSTOPERATIVE DIAGNOSIS:  Menometrorrhagia.  PROCEDURE:  Cervical dilatation, hysteroscopy, uterine curettage followed by NovaSure endometrial ablation.  SURGEON:  Miguel Aschoff, M.D.  ANESTHESIA:  General.  COMPLICATIONS:  None.  JUSTIFICATION:  The patient is a 43 year old, black female, with history of heavy irregular periods and has been treated with medical therapy but this did not resolve her irregular bleeding.  The patient underwent sonohysterogram, this did not reveal any intracavitary lesions and because of the persistent bleeding and her desire for this to be corrected and her not desiring a Mirena IUD or other modes of therapy, she is being taken to the operating room at this time to undergo cervical dilatation, hysteroscopy, uterine curettage, and NovaSure ablation.  The risks and benefits of the procedure were discussed with the patient and informed consent has been obtained.  PROCEDURE:  The patient was taken to the operating room, placed in supine position.  General anesthesia was administered without difficulty.  She was then placed in the dorsal lithotomy position. Prepped and draped in usual sterile fashion.  Once this was done, the bladder was catheterized.  Examination under anesthesia revealed normal external genitalia.  Normal Bartholin, Skene glands, normal urethra. Vaginal vault was without gross lesion.  The cervix was without gross lesion.  Uterus was noted to be anterior, normal size and shape.  No adnexal masses were noted.  At this point, speculum placed in the vaginal vault.  Anterior cervical  lip was grasped with tenaculum and then the uterus was sounded to 9 cm.  A cervical length of 3.5 cm was then determined for a cavity length of 5.5 cm.  At this point, the cervix was further dilated and the diagnostic hysteroscope was advanced through the endocervical canal.  No endocervical lesions were noted. Visualization of the endometrial cavity did not reveal any submucous myomas or polyps.  There was an erythematous area on the anterior uterine wall, extending up to the fundus and areas of petechiae and hemorrhage.  No other abnormalities were noted.  At this point, the hysteroscope was removed and sharp vigorous curettage was carried out using a small serrated curette, and this was sent for histologic study. Once this was completed, the NovaSure endometrial ablation unit was introduced into the uterine cavity.  Cavity width of 4.8 cm was then determined and once this was done, the cavity assessment test was done in the past.  At this point, a treatment cycle for 1 minute 26 seconds was carried out at 145 watts.  On completion of the treatment cycle, the NovaSure instrument was removed intact.  The hysteroscope was readvanced into the uterine cavity.  Cavity appeared to be well ablated.  At this point, the hysteroscope was removed.  The cervix was injected with total 10 mL of 1% Xylocaine for  postop analgesia.  The patient reversed lithotomy position, reversed from the anesthetic and brought to the recovery room in satisfactory condition.  Estimated blood loss from the procedure was minimal.  There was 280 mL deficit.  The patient tolerated the procedure well.  Plan is for the patient to be discharged home.  Medications for home include doxycycline 100 mg twice a day x2 days and tramadol 50 mg one 3 times a day as needed for pain. The patient was seen back in 4 weeks for followup examination.  She is to call for any problems such as fever, pain, or heavy bleeding.     Miguel Aschoff, M.D.     AR/MEDQ  D:  07/25/2013  T:  07/25/2013  Job:  [id number]

## 2013-07-25 NOTE — Anesthesia Postprocedure Evaluation (Signed)
  Anesthesia Post-op Note  Patient: Sandra Bird  Procedure(s) Performed: Procedure(s): DILATATION & CURETTAGE/HYSTEROSCOPY WITH NOVASURE ABLATION (N/A)  Patient Location: PACU  Anesthesia Type:General  Level of Consciousness: awake, alert  and oriented  Airway and Oxygen Therapy: Patient Spontanous Breathing  Post-op Pain: none  Post-op Assessment: Post-op Vital signs reviewed, Patient's Cardiovascular Status Stable, Respiratory Function Stable, Patent Airway, No signs of Nausea or vomiting and Pain level controlled  Post-op Vital Signs: Reviewed and stable  Complications: No apparent anesthesia complications

## 2013-07-25 NOTE — Transfer of Care (Signed)
Immediate Anesthesia Transfer of Care Note  Patient: Sandra Bird  Procedure(s) Performed: Procedure(s): DILATATION & CURETTAGE/HYSTEROSCOPY WITH NOVASURE ABLATION (N/A)  Patient Location: PACU  Anesthesia Type:General  Level of Consciousness: awake, alert  and oriented  Airway & Oxygen Therapy: Patient Spontanous Breathing and Patient connected to nasal cannula oxygen  Post-op Assessment: Report given to PACU RN, Post -op Vital signs reviewed and stable and Patient moving all extremities  Post vital signs: Reviewed and stable  Complications: No apparent anesthesia complications

## 2013-07-25 NOTE — H&P (Signed)
  Status unchanged will proceed with planned procedure.

## 2013-07-25 NOTE — Anesthesia Preprocedure Evaluation (Addendum)
Anesthesia Evaluation  Patient identified by MRN, date of birth, ID band Patient awake    Reviewed: Allergy & Precautions, H&P , NPO status , Patient's Chart, lab work & pertinent test results  Airway Mallampati: III TM Distance: >3 FB Neck ROM: Full    Dental no notable dental hx. (+) Teeth Intact   Pulmonary neg pulmonary ROS,  breath sounds clear to auscultation  Pulmonary exam normal       Cardiovascular negative cardio ROS  Rhythm:Regular Rate:Normal     Neuro/Psych negative neurological ROS  negative psych ROS   GI/Hepatic negative GI ROS, Neg liver ROS,   Endo/Other  Morbid obesity  Renal/GU negative Renal ROS  negative genitourinary   Musculoskeletal  (+) Arthritis -, Osteoarthritis,    Abdominal (+) + obese,   Peds  Hematology   Anesthesia Other Findings   Reproductive/Obstetrics Menorrhagia                          Anesthesia Physical Anesthesia Plan  ASA: III  Anesthesia Plan: General   Post-op Pain Management:    Induction: Intravenous  Airway Management Planned: LMA  Additional Equipment:   Intra-op Plan:   Post-operative Plan:   Informed Consent: I have reviewed the patients History and Physical, chart, labs and discussed the procedure including the risks, benefits and alternatives for the proposed anesthesia with the patient or authorized representative who has indicated his/her understanding and acceptance.   Dental advisory given  Plan Discussed with: CRNA, Anesthesiologist and Surgeon  Anesthesia Plan Comments:         Anesthesia Quick Evaluation

## 2013-07-25 NOTE — Discharge Instructions (Signed)
DISCHARGE INSTRUCTIONS: HYSTEROSCOPY / ENDOMETRIAL ABLATION The following instructions have been prepared to help you care for yourself upon your return home.  No ibuprofen containing products (ie Advil, Aleve, Motrin, etc.) until after 1:45 pm today.  Personal hygiene:  Use sanitary pads for vaginal drainage, not tampons.  Shower the day after your procedure.  NO tub baths, pools or Jacuzzis for 2-3 weeks.  Wipe front to back after using the bathroom.  Activity and limitations:  Do NOT drive or operate any equipment for 24 hours. The effects of anesthesia are still present and drowsiness may result.  Do NOT rest in bed all day.  Walking is encouraged.  Walk up and down stairs slowly.  You may resume your normal activity in one to two days or as indicated by your physician. Sexual activity: NO intercourse for at least 2 weeks after the procedure, or as indicated by your Doctor.  Diet: Eat a light meal as desired this evening. You may resume your usual diet tomorrow.  Return to Work: You may resume your work activities in one to two days or as indicated by Therapist, sportsyour Doctor.  What to expect after your surgery: Expect to have vaginal bleeding/discharge for 2-3 days and spotting for up to 10 days. It is not unusual to have soreness for up to 1-2 weeks. You may have a slight burning sensation when you urinate for the first day. Mild cramps may continue for a couple of days. You may have a regular period in 2-6 weeks.  Call your doctor for any of the following:  Excessive vaginal bleeding or clotting, saturating and changing one pad every hour.  Inability to urinate 6 hours after discharge from hospital.  Pain not relieved by pain medication.  Fever of 100.4 F or greater.  Unusual vaginal discharge or odor.  Return to office _________________Call for an appointment ___________________ Patients signature: ______________________ Nurses signature  ________________________  Post Anesthesia Care Unit [phone]

## 2013-07-26 ENCOUNTER — Encounter (HOSPITAL_COMMUNITY): Payer: Self-pay | Admitting: Obstetrics and Gynecology

## 2014-04-04 ENCOUNTER — Other Ambulatory Visit: Payer: Self-pay | Admitting: Obstetrics and Gynecology

## 2014-04-07 LAB — CYTOLOGY - PAP

## 2014-06-12 ENCOUNTER — Encounter (HOSPITAL_COMMUNITY): Payer: Self-pay | Admitting: *Deleted

## 2014-06-12 ENCOUNTER — Emergency Department (INDEPENDENT_AMBULATORY_CARE_PROVIDER_SITE_OTHER)
Admission: EM | Admit: 2014-06-12 | Discharge: 2014-06-12 | Disposition: A | Discharge status: Home / Self Care | Payer: BLUE CROSS/BLUE SHIELD | Source: Home / Self Care | Attending: Family Medicine | Admitting: Family Medicine

## 2014-06-12 DIAGNOSIS — L259 Unspecified contact dermatitis, unspecified cause: Secondary | ICD-10-CM

## 2014-06-12 DIAGNOSIS — H1013 Acute atopic conjunctivitis, bilateral: Secondary | ICD-10-CM

## 2014-06-12 MED ORDER — TRIAMCINOLONE ACETONIDE 0.1 % EX CREA: 1.0000 "application " | TOPICAL_CREAM | Freq: Two times a day (BID) | CUTANEOUS | Status: DC

## 2014-06-12 NOTE — ED Notes (Addendum)
C/o pink eye that started in R eye and moved to L eye 2 days ago.  C/o itching and burning.

## 2014-06-12 NOTE — Discharge Instructions (Signed)
Thank you for coming in today. Use over-the-counter Zaditor eyedrops (Ketotifen) Use over-the-counter Zyrtec (cetirizine)  Use Systane artificial tears as needed  \Use the cream on the neck rash twice daily as needed.    Allergic Conjunctivitis The conjunctiva is a thin membrane that covers the visible white part of the eyeball and the underside of the eyelids. This membrane protects and lubricates the eye. The membrane has small blood vessels running through it that can normally be seen. When the conjunctiva becomes inflamed, the condition is called conjunctivitis. In response to the inflammation, the conjunctival blood vessels become swollen. The swelling results in redness in the normally white part of the eye. The blood vessels of this membrane also react when a person has allergies and is then called allergic conjunctivitis. This condition usually lasts for as long as the allergy persists. Allergic conjunctivitis cannot be passed to another person (non-contagious). The likelihood of bacterial infection is great and the cause is not likely due to allergies if the inflamed eye has:  A sticky discharge.  Discharge or sticking together of the lids in the morning.  Scaling or flaking of the eyelids where the eyelashes come out.  Red swollen eyelids. CAUSES   Viruses.  Irritants such as foreign bodies.  Chemicals.  General allergic reactions.  Inflammation or serious diseases in the inside or the outside of the eye or the orbit (the boney cavity in which the eye sits) can cause a "red eye." SYMPTOMS   Eye redness.  Tearing.  Itchy eyes.  Burning feeling in the eyes.  Clear drainage from the eye.  Allergic reaction due to pollens or ragweed sensitivity. Seasonal allergic conjunctivitis is frequent in the spring when pollens are in the air and in the fall. DIAGNOSIS  This condition, in its many forms, is usually diagnosed based on the history and an ophthalmological exam. It  usually involves both eyes. If your eyes react at the same time every year, allergies may be the cause. While most "red eyes" are due to allergy or an infection, the role of an eye (ophthalmological) exam is important. The exam can rule out serious diseases of the eye or orbit. TREATMENT   Non-antibiotic eye drops, ointments, or medications by mouth may be prescribed if the ophthalmologist is sure the conjunctivitis is due to allergies alone.  Over-the-counter drops and ointments for allergic symptoms should be used only after other causes of conjunctivitis have been ruled out, or as your caregiver suggests. Medications by mouth are often prescribed if other allergy-related symptoms are present. If the ophthalmologist is sure that the conjunctivitis is due to allergies alone, treatment is normally limited to drops or ointments to reduce itching and burning. HOME CARE INSTRUCTIONS   Wash hands before and after applying drops or ointments, or touching the inflamed eye(s) or eyelids.  Do not let the eye dropper tip or ointment tube touch the eyelid when putting medicine in your eye.  Stop using your soft contact lenses and throw them away. Use a new pair of lenses when recovery is complete. You should run through sterilizing cycles at least three times before use after complete recovery if the old soft contact lenses are to be used. Hard contact lenses should be stopped. They need to be thoroughly sterilized before use after recovery.  Itching and burning eyes due to allergies is often relieved by using a cool cloth applied to closed eye(s). SEEK MEDICAL CARE IF:   Your problems do not go away after two  or three days of treatment.  Your lids are sticky (especially in the morning when you wake up) or stick together.  Discharge develops. Antibiotics may be needed either as drops, ointment, or by mouth.  You have extreme light sensitivity.  An oral temperature above 102 F (38.9 C)  develops.  Pain in or around the eye or any other visual symptom develops. MAKE SURE YOU:   Understand these instructions.  Will watch your condition.  Will get help right away if you are not doing well or get worse. Document Released: 07/02/2002 Document Revised: 07/04/2011 Document Reviewed: 05/28/2007 [facility] Patient Information 2015 [place]. This information is not intended to replace advice given to you by your health care provider. Make sure you discuss any questions you have with your health care provider.   Contact Dermatitis Contact dermatitis is a reaction to certain substances that touch the skin. Contact dermatitis can be either irritant contact dermatitis or allergic contact dermatitis. Irritant contact dermatitis does not require previous exposure to the substance for a reaction to occur.Allergic contact dermatitis only occurs if you have been exposed to the substance before. Upon a repeat exposure, your body reacts to the substance.  CAUSES  Many substances can cause contact dermatitis. Irritant dermatitis is most commonly caused by repeated exposure to mildly irritating substances, such as:  Makeup.  Soaps.  Detergents.  Bleaches.  Acids.  Metal salts, such as nickel. Allergic contact dermatitis is most commonly caused by exposure to:  Poisonous plants.  Chemicals (deodorants, shampoos).  Jewelry.  Latex.  Neomycin in triple antibiotic cream.  Preservatives in products, including clothing. SYMPTOMS  The area of skin that is exposed may develop:  Dryness or flaking.  Redness.  Cracks.  Itching.  Pain or a burning sensation.  Blisters. With allergic contact dermatitis, there may also be swelling in areas such as the eyelids, mouth, or genitals.  DIAGNOSIS  Your caregiver can usually tell what the problem is by doing a physical exam. In cases where the cause is uncertain and an allergic contact dermatitis is suspected, a patch skin  test may be performed to help determine the cause of your dermatitis. TREATMENT Treatment includes protecting the skin from further contact with the irritating substance by avoiding that substance if possible. Barrier creams, powders, and gloves may be helpful. Your caregiver may also recommend:  Steroid creams or ointments applied 2 times daily. For best results, soak the rash area in cool water for 20 minutes. Then apply the medicine. Cover the area with a plastic wrap. You can store the steroid cream in the refrigerator for a "chilly" effect on your rash. That may decrease itching. Oral steroid medicines may be needed in more severe cases.  Antibiotics or antibacterial ointments if a skin infection is present.  Antihistamine lotion or an antihistamine taken by mouth to ease itching.  Lubricants to keep moisture in your skin.  Burow's solution to reduce redness and soreness or to dry a weeping rash. Mix one packet or tablet of solution in 2 cups cool water. Dip a clean washcloth in the mixture, wring it out a bit, and put it on the affected area. Leave the cloth in place for 30 minutes. Do this as often as possible throughout the day.  Taking several cornstarch or baking soda baths daily if the area is too large to cover with a washcloth. Harsh chemicals, such as alkalis or acids, can cause skin damage that is like a burn. You should flush your skin  for 15 to 20 minutes with cold water after such an exposure. You should also seek immediate medical care after exposure. Bandages (dressings), antibiotics, and pain medicine may be needed for severely irritated skin.  HOME CARE INSTRUCTIONS  Avoid the substance that caused your reaction.  Keep the area of skin that is affected away from hot water, soap, sunlight, chemicals, acidic substances, or anything else that would irritate your skin.  Do not scratch the rash. Scratching may cause the rash to become infected.  You may take cool baths to  help stop the itching.  Only take over-the-counter or prescription medicines as directed by your caregiver.  See your caregiver for follow-up care as directed to make sure your skin is healing properly. SEEK MEDICAL CARE IF:   Your condition is not better after 3 days of treatment.  You seem to be getting worse.  You see signs of infection such as swelling, tenderness, redness, soreness, or warmth in the affected area.  You have any problems related to your medicines. Document Released: 04/08/2000 Document Revised: 07/04/2011 Document Reviewed: 09/14/2010 [facility] Patient Information 2015 [place], [place]. This information is not intended to replace advice given to you by your health care provider. Make sure you discuss any questions you have with your health care provider.

## 2014-06-12 NOTE — ED Provider Notes (Signed)
Sandra Bird is a 44 y.o. female who presents to Urgent Care today for Conjunctival injection and throat rash and itching present for 3 days. No fevers or chills vomiting or diarrhea. No blurry vision. Patient notes itchy watery eyes. No eye pain. Patient has tried some Benadryl which helps temporarily. No sick contacts at home or work.   Past Medical History  Diagnosis Date  . SVD (spontaneous vaginal delivery)     x 3   Past Surgical History  Procedure Laterality Date  . Cholecystectomy    . Tonsillectomy    . Tubal ligation    . Wisdom tooth extraction    . Dilitation & currettage/hystroscopy with novasure ablation N/A 07/25/2013    Procedure: DILATATION & CURETTAGE/HYSTEROSCOPY WITH NOVASURE ABLATION;  Surgeon: Miguel AschoffAllan Ross, MD;  Location: [facility];  Service: Gynecology;  Laterality: N/A;   History  Substance Use Topics  . Smoking status: Never Smoker   . Smokeless tobacco: Never Used  . Alcohol Use: Yes     Comment: socially   ROS as above Medications: No current facility-administered medications for this encounter.   Current Outpatient Prescriptions  Medication Sig Dispense Refill  . meloxicam (MOBIC) 7.5 MG tablet Take 7.5 mg by mouth daily.    . Multiple Vitamin (MULITIVITAMIN WITH MINERALS) TABS Take 1 tablet by mouth daily.      . phentermine 37.5 MG capsule Take 37.5 mg by mouth every morning.    . pravastatin (PRAVACHOL) 10 MG tablet Take 10 mg by mouth at bedtime.    Marland Kitchen. ibuprofen (ADVIL,MOTRIN) 200 MG tablet Take 800 mg by mouth every 6 (six) hours as needed for headache or moderate pain.    Marland Kitchen. triamcinolone cream (KENALOG) 0.1 % Apply 1 application topically 2 (two) times daily. 30 g 1  . Vitamin D, Ergocalciferol, (DRISDOL) 50000 UNITS CAPS capsule Take 50,000 Units by mouth every 7 (seven) days.     Allergies  Allergen Reactions  . Penicillins Rash     Exam:  BP 180/86 mmHg  Pulse 82  Temp(Src) 98.2 F (36.8 C) (Oral)  Resp 16  SpO2 99% Gen: Well  NAD HEENT: EOMI,  MMM mild conjunctiva injection. PERRLA Lungs: Normal work of breathing. CTABL Heart: RRR no MRG Abd: NABS, Soft. Nondistended, Nontender Exts: Brisk capillary refill, warm and well perfused.  Skin: Mild erythematous maculopapular rash neck. nontender  No results found for this or any previous visit (from the past 24 hour(s)). No results found.  Assessment and Plan: 44 y.o. female with allergic conjunctivitis and probable contact dermatitis. Treat with triamcinolone cream and Zaditor eyedrops  Discussed warning signs or symptoms. Please see discharge instructions. Patient expresses understanding.     Rodolph BongEvan S Cearra Portnoy, MD 06/12/14 [phone]

## 2014-06-15 ENCOUNTER — Encounter (HOSPITAL_COMMUNITY): Payer: Self-pay | Admitting: Emergency Medicine

## 2014-06-15 ENCOUNTER — Emergency Department (HOSPITAL_COMMUNITY)
Admission: EM | Admit: 2014-06-15 | Discharge: 2014-06-15 | Disposition: A | Discharge status: Home / Self Care | Payer: BC Managed Care – PPO | Attending: Emergency Medicine | Admitting: Emergency Medicine

## 2014-06-15 DIAGNOSIS — H1013 Acute atopic conjunctivitis, bilateral: Secondary | ICD-10-CM | POA: Insufficient documentation

## 2014-06-15 DIAGNOSIS — Z7952 Long term (current) use of systemic steroids: Secondary | ICD-10-CM | POA: Insufficient documentation

## 2014-06-15 DIAGNOSIS — H578 Other specified disorders of eye and adnexa: Secondary | ICD-10-CM | POA: Diagnosis present

## 2014-06-15 DIAGNOSIS — Z880 Allergy status to penicillin: Secondary | ICD-10-CM | POA: Insufficient documentation

## 2014-06-15 DIAGNOSIS — Z79899 Other long term (current) drug therapy: Secondary | ICD-10-CM | POA: Diagnosis not present

## 2014-06-15 DIAGNOSIS — Z791 Long term (current) use of non-steroidal anti-inflammatories (NSAID): Secondary | ICD-10-CM | POA: Insufficient documentation

## 2014-06-15 MED ORDER — OLOPATADINE HCL 0.1 % OP SOLN: 1.0000 [drp] | Freq: Two times a day (BID) | OPHTHALMIC | Status: DC

## 2014-06-15 NOTE — ED Provider Notes (Signed)
CSN: [id number]     Arrival date & time 06/15/14  4098 History   First MD Initiated Contact with Patient 06/15/14 0831     Chief Complaint  Patient presents with  . Eye Problem     (Consider location/radiation/quality/duration/timing/severity/associated sxs/prior Treatment) HPI Comments: Patient here complaining of bilateral eye redness and pruritus. Has been using vitamin E cream on her eyes for some time. Went to urgent care for similar symptoms 2 days ago and diagnosed with allergic conjunctivitis and probable contact dermatitis. Prescribed medications without relief. Denies any photophobia. No green-yellow discharge from the eye. No fever noted. No decrease in vision noted. Denies any headache.  Patient is a 44 y.o. female presenting with eye problem. The history is provided by the patient.  Eye Problem   Past Medical History  Diagnosis Date  . SVD (spontaneous vaginal delivery)     x 3   Past Surgical History  Procedure Laterality Date  . Cholecystectomy    . Tonsillectomy    . Tubal ligation    . Wisdom tooth extraction    . Dilitation & currettage/hystroscopy with novasure ablation N/A 07/25/2013    Procedure: DILATATION & CURETTAGE/HYSTEROSCOPY WITH NOVASURE ABLATION;  Surgeon: Miguel Aschoff, MD;  Location: [facility];  Service: Gynecology;  Laterality: N/A;   Family History  Problem Relation Age of Onset  . Heart failure Mother   . Heart attack Father    History  Substance Use Topics  . Smoking status: Never Smoker   . Smokeless tobacco: Never Used  . Alcohol Use: Yes     Comment: socially   OB History    No data available     Review of Systems  All other systems reviewed and are negative.     Allergies  Penicillins  Home Medications   Prior to Admission medications   Medication Sig Start Date End Date Taking? Authorizing Provider  ibuprofen (ADVIL,MOTRIN) 200 MG tablet Take 800 mg by mouth every 6 (six) hours as needed for headache or moderate pain.     Historical Provider, MD  meloxicam (MOBIC) 7.5 MG tablet Take 7.5 mg by mouth daily.    Historical Provider, MD  Multiple Vitamin (MULITIVITAMIN WITH MINERALS) TABS Take 1 tablet by mouth daily.      Historical Provider, MD  olopatadine (PATANOL) 0.1 % ophthalmic solution Place 1 drop into both eyes 2 (two) times daily. 06/15/14   Toy Baker, MD  phentermine 37.5 MG capsule Take 37.5 mg by mouth every morning.    Historical Provider, MD  pravastatin (PRAVACHOL) 10 MG tablet Take 10 mg by mouth at bedtime.    Historical Provider, MD  triamcinolone cream (KENALOG) 0.1 % Apply 1 application topically 2 (two) times daily. 06/12/14   Rodolph Bong, MD  Vitamin D, Ergocalciferol, (DRISDOL) 50000 UNITS CAPS capsule Take 50,000 Units by mouth every 7 (seven) days.    Historical Provider, MD   BP 160/89 mmHg  Pulse 90  Temp(Src) 98 F (36.7 C) (Oral)  Resp 18  SpO2 100% Physical Exam  Constitutional: She is oriented to person, place, and time. She appears well-developed and well-nourished.  Non-toxic appearance. No distress.  HENT:  Head: Normocephalic and atraumatic.  Eyes: EOM are normal. Pupils are equal, round, and reactive to light. Right eye exhibits no discharge and no exudate. Left eye exhibits no discharge and no exudate. Right conjunctiva is injected. Left conjunctiva is injected.  Bilateral eyelids with changes consistent with contact dermatitis  Neck: Normal range of  motion. Neck supple. No tracheal deviation present. No thyroid mass present.  Cardiovascular: Normal rate, regular rhythm and normal heart sounds.  Exam reveals no gallop.   No murmur heard. Pulmonary/Chest: Effort normal and breath sounds normal. No stridor. No respiratory distress. She has no decreased breath sounds. She has no wheezes. She has no rhonchi. She has no rales.  Abdominal: Soft. Normal appearance and bowel sounds are normal. She exhibits no distension. There is no tenderness. There is no rebound and no CVA  tenderness.  Musculoskeletal: Normal range of motion. She exhibits no edema or tenderness.  Neurological: She is alert and oriented to person, place, and time. She has normal strength. No cranial nerve deficit or sensory deficit. GCS eye subscore is 4. GCS verbal subscore is 5. GCS motor subscore is 6.  Skin: Skin is warm and dry. No abrasion and no rash noted.  Psychiatric: She has a normal mood and affect. Her speech is normal and behavior is normal.  Nursing note and vitals reviewed.   ED Course  Procedures (including critical care time) Labs Review Labs Reviewed - No data to display  Imaging Review No results found.   EKG Interpretation None      MDM   Final diagnoses:  Allergic conjunctivitis, bilateral   patient placed on medications for likely allergic conjunctivitis and was counseled to not use her family cream on her eyes.    Toy BakerAnthony T Ameila Weldon, MD 06/15/14 [phone]

## 2014-06-15 NOTE — ED Notes (Addendum)
Pt from home c/o bilateral eye redness, drainage and itching. [facility] was told to take over the counter eye drops for conjunctivitis , she has since had no relief.

## 2014-06-15 NOTE — Discharge Instructions (Signed)
Allergic Conjunctivitis  The conjunctiva is a thin membrane that covers the visible white part of the eyeball and the underside of the eyelids. This membrane protects and lubricates the eye. The membrane has small blood vessels running through it that can normally be seen. When the conjunctiva becomes inflamed, the condition is called conjunctivitis. In response to the inflammation, the conjunctival blood vessels become swollen. The swelling results in redness in the normally white part of the eye.  The blood vessels of this membrane also react when a person has allergies and is then called allergic conjunctivitis. This condition usually lasts for as long as the allergy persists. Allergic conjunctivitis cannot be passed to another person (non-contagious). The likelihood of bacterial infection is great and the cause is not likely due to allergies if the inflamed eye has:  · A sticky discharge.  · Discharge or sticking together of the lids in the morning.  · Scaling or flaking of the eyelids where the eyelashes come out.  · Red swollen eyelids.  CAUSES   · Viruses.  · Irritants such as foreign bodies.  · Chemicals.  · General allergic reactions.  · Inflammation or serious diseases in the inside or the outside of the eye or the orbit (the boney cavity in which the eye sits) can cause a "red eye."  SYMPTOMS   · Eye redness.  · Tearing.  · Itchy eyes.  · Burning feeling in the eyes.  · Clear drainage from the eye.  · Allergic reaction due to pollens or ragweed sensitivity. Seasonal allergic conjunctivitis is frequent in the spring when pollens are in the air and in the fall.  DIAGNOSIS   This condition, in its many forms, is usually diagnosed based on the history and an ophthalmological exam. It usually involves both eyes. If your eyes react at the same time every year, allergies may be the cause. While most "red eyes" are due to allergy or an infection, the role of an eye (ophthalmological) exam is important. The exam  can rule out serious diseases of the eye or orbit.  TREATMENT   · Non-antibiotic eye drops, ointments, or medications by mouth may be prescribed if the ophthalmologist is sure the conjunctivitis is due to allergies alone.  · Over-the-counter drops and ointments for allergic symptoms should be used only after other causes of conjunctivitis have been ruled out, or as your caregiver suggests.  Medications by mouth are often prescribed if other allergy-related symptoms are present. If the ophthalmologist is sure that the conjunctivitis is due to allergies alone, treatment is normally limited to drops or ointments to reduce itching and burning.  HOME CARE INSTRUCTIONS   · Wash hands before and after applying drops or ointments, or touching the inflamed eye(s) or eyelids.  · Do not let the eye dropper tip or ointment tube touch the eyelid when putting medicine in your eye.  · Stop using your soft contact lenses and throw them away. Use a new pair of lenses when recovery is complete. You should run through sterilizing cycles at least three times before use after complete recovery if the old soft contact lenses are to be used. Hard contact lenses should be stopped. They need to be thoroughly sterilized before use after recovery.  · Itching and burning eyes due to allergies is often relieved by using a cool cloth applied to closed eye(s).  SEEK MEDICAL CARE IF:   · Your problems do not go away after two or three days of treatment.  ·   Your lids are sticky (especially in the morning when you wake up) or stick together.  · Discharge develops. Antibiotics may be needed either as drops, ointment, or by mouth.  · You have extreme light sensitivity.  · An oral temperature above 102° F (38.9° C) develops.  · Pain in or around the eye or any other visual symptom develops.  MAKE SURE YOU:   · Understand these instructions.  · Will watch your condition.  · Will get help right away if you are not doing well or get worse.  Document  Released: 07/02/2002 Document Revised: 07/04/2011 Document Reviewed: 05/28/2007  [employer]® Patient Information ©2015 [employer]. This information is not intended to replace advice given to you by your health care provider. Make sure you discuss any questions you have with your health care provider.

## 2014-06-15 NOTE — ED Notes (Signed)
Pt alert,oriented, and ambulatory upon DC. 

## 2014-06-15 NOTE — ED Notes (Signed)
Cool cloths provided for patients eyes.

## 2014-06-15 NOTE — ED Notes (Signed)
MD Allen at bedside.

## 2014-09-18 ENCOUNTER — Other Ambulatory Visit: Payer: Self-pay | Admitting: Occupational Medicine

## 2014-09-18 ENCOUNTER — Ambulatory Visit: Payer: Self-pay

## 2014-09-18 DIAGNOSIS — M25561 Pain in right knee: Secondary | ICD-10-CM

## 2014-12-01 ENCOUNTER — Emergency Department (HOSPITAL_COMMUNITY)
Admission: EM | Admit: 2014-12-01 | Discharge: 2014-12-01 | Disposition: A | Discharge status: Home / Self Care | Payer: BLUE CROSS/BLUE SHIELD | Attending: Emergency Medicine | Admitting: Emergency Medicine

## 2014-12-01 ENCOUNTER — Encounter (HOSPITAL_COMMUNITY): Payer: Self-pay | Admitting: Emergency Medicine

## 2014-12-01 DIAGNOSIS — S161XXA Strain of muscle, fascia and tendon at neck level, initial encounter: Secondary | ICD-10-CM | POA: Diagnosis not present

## 2014-12-01 DIAGNOSIS — Y998 Other external cause status: Secondary | ICD-10-CM | POA: Insufficient documentation

## 2014-12-01 DIAGNOSIS — S199XXA Unspecified injury of neck, initial encounter: Secondary | ICD-10-CM | POA: Diagnosis present

## 2014-12-01 DIAGNOSIS — Y92410 Unspecified street and highway as the place of occurrence of the external cause: Secondary | ICD-10-CM | POA: Diagnosis not present

## 2014-12-01 DIAGNOSIS — Z79899 Other long term (current) drug therapy: Secondary | ICD-10-CM | POA: Insufficient documentation

## 2014-12-01 DIAGNOSIS — Z880 Allergy status to penicillin: Secondary | ICD-10-CM | POA: Diagnosis not present

## 2014-12-01 DIAGNOSIS — Z7952 Long term (current) use of systemic steroids: Secondary | ICD-10-CM | POA: Insufficient documentation

## 2014-12-01 DIAGNOSIS — Y9389 Activity, other specified: Secondary | ICD-10-CM | POA: Diagnosis not present

## 2014-12-01 MED ORDER — NAPROXEN 500 MG PO TABS: 500.0000 mg | ORAL_TABLET | Freq: Two times a day (BID) | ORAL | Status: DC

## 2014-12-01 MED ORDER — METHOCARBAMOL 500 MG PO TABS: 500.0000 mg | ORAL_TABLET | Freq: Two times a day (BID) | ORAL | Status: DC
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
## 2015-05-11 MED FILL — PRAVASTATIN NA 20 MG TAB: 20 | 30 days supply | Qty: 30 | Fill #2

## 2015-05-11 MED FILL — PHENTERMINE 37.5 MG TABLET: 37.5 | 30 days supply | Qty: 30 | Fill #0

## 2015-05-11 MED FILL — VIT D2 1.25 MG (50,000 UNIT: 1.25 MG | 28 days supply | Qty: 4 | Fill #1

## 2015-06-08 ENCOUNTER — Emergency Department (HOSPITAL_COMMUNITY)
Admission: EM | Admit: 2015-06-08 | Discharge: 2015-06-08 | Disposition: A | Discharge status: Home / Self Care | Payer: BLUE CROSS/BLUE SHIELD | Attending: Emergency Medicine | Admitting: Emergency Medicine

## 2015-06-08 ENCOUNTER — Encounter (HOSPITAL_COMMUNITY): Payer: Self-pay | Admitting: Emergency Medicine

## 2015-06-08 ENCOUNTER — Emergency Department (HOSPITAL_COMMUNITY): Payer: BLUE CROSS/BLUE SHIELD

## 2015-06-08 DIAGNOSIS — R000 Tachycardia, unspecified: Secondary | ICD-10-CM | POA: Diagnosis not present

## 2015-06-08 DIAGNOSIS — Z79899 Other long term (current) drug therapy: Secondary | ICD-10-CM | POA: Diagnosis not present

## 2015-06-08 DIAGNOSIS — R05 Cough: Secondary | ICD-10-CM | POA: Diagnosis present

## 2015-06-08 DIAGNOSIS — Z880 Allergy status to penicillin: Secondary | ICD-10-CM | POA: Diagnosis not present

## 2015-06-08 DIAGNOSIS — J101 Influenza due to other identified influenza virus with other respiratory manifestations: Secondary | ICD-10-CM

## 2015-06-08 DIAGNOSIS — R509 Fever, unspecified: Secondary | ICD-10-CM

## 2015-06-08 LAB — CBC WITH DIFFERENTIAL/PLATELET
Basophils Absolute: 0 10*3/uL (ref 0.0–0.1)
Basophils Relative: 0 %
EOS ABS: 0 10*3/uL (ref 0.0–0.7)
Eosinophils Relative: 0 %
HCT: 42.4 % (ref 36.0–46.0)
Hemoglobin: 13.9 g/dL (ref 12.0–15.0)
LYMPHS PCT: 18 %
Lymphs Abs: 0.8 10*3/uL (ref 0.7–4.0)
MCH: 27.2 pg (ref 26.0–34.0)
MCHC: 32.8 g/dL (ref 30.0–36.0)
MCV: 83 fL (ref 78.0–100.0)
Monocytes Absolute: 0.5 10*3/uL (ref 0.1–1.0)
Monocytes Relative: 11 %
Neutro Abs: 3.2 10*3/uL (ref 1.7–7.7)
Neutrophils Relative %: 71 %
Platelets: 242 10*3/uL (ref 150–400)
RBC: 5.11 MIL/uL (ref 3.87–5.11)
RDW: 14.3 % (ref 11.5–15.5)
WBC: 4.6 10*3/uL (ref 4.0–10.5)

## 2015-06-08 LAB — COMPREHENSIVE METABOLIC PANEL
ALT: 56 U/L — ABNORMAL HIGH (ref 14–54)
ANION GAP: 16 — AB (ref 5–15)
AST: 53 U/L — ABNORMAL HIGH (ref 15–41)
Albumin: 3.6 g/dL (ref 3.5–5.0)
Alkaline Phosphatase: 127 U/L — ABNORMAL HIGH (ref 38–126)
BILIRUBIN TOTAL: 0.8 mg/dL (ref 0.3–1.2)
BUN: 6 mg/dL (ref 6–20)
CO2: 24 mmol/L (ref 22–32)
Calcium: 9.3 mg/dL (ref 8.9–10.3)
Chloride: 99 mmol/L — ABNORMAL LOW (ref 101–111)
Creatinine, Ser: 0.93 mg/dL (ref 0.44–1.00)
GFR calc Af Amer: >60 mL/min (ref >60)
Glucose, Bld: 120 mg/dL — ABNORMAL HIGH (ref 65–99)
POTASSIUM: 3.6 mmol/L (ref 3.5–5.1)
Sodium: 139 mmol/L (ref 135–145)
Total Protein: 7.2 g/dL (ref 6.5–8.1)

## 2015-06-08 LAB — URINE MICROSCOPIC-ADD ON

## 2015-06-08 LAB — I-STAT CG4 LACTIC ACID, ED
LACTIC ACID, VENOUS: 2.96 mmol/L — AB (ref 0.5–2.0)
LACTIC ACID, VENOUS: 1.35 mmol/L (ref 0.5–2.0)

## 2015-06-08 LAB — URINALYSIS, ROUTINE W REFLEX MICROSCOPIC
Bilirubin Urine: NEGATIVE
Glucose, UA: NEGATIVE mg/dL
Ketones, ur: NEGATIVE mg/dL
Nitrite: NEGATIVE
Protein, ur: NEGATIVE mg/dL
Specific Gravity, Urine: 1.008 (ref 1.005–1.030)
pH: 6 (ref 5.0–8.0)

## 2015-06-08 LAB — INFLUENZA PANEL BY PCR (TYPE A & B)
H1N1FLUPCR: NOT DETECTED
INFLAPCR: POSITIVE — AB
Influenza B By PCR: NEGATIVE

## 2015-06-08 MED ORDER — AEROCHAMBER PLUS W/MASK MISC
Status: AC
  Filled 2015-06-08: qty 1

## 2015-06-08 MED ORDER — SODIUM CHLORIDE 0.9 % IV BOLUS (SEPSIS): 500.0000 mL | INTRAVENOUS | Status: AC

## 2015-06-08 MED ORDER — ACETAMINOPHEN 325 MG PO TABS
650.0000 mg | ORAL_TABLET | Freq: Once | ORAL | Status: AC | PRN
  Administered 2015-06-08: 650 mg via ORAL

## 2015-06-08 MED ORDER — AEROCHAMBER PLUS FLO-VU MEDIUM MISC: 1.0000 | Freq: Once | Status: DC

## 2015-06-08 MED ORDER — IBUPROFEN 200 MG PO TABS
600.0000 mg | ORAL_TABLET | Freq: Once | ORAL | Status: AC
  Administered 2015-06-08: 600 mg via ORAL
  Filled 2015-06-08: qty 3

## 2015-06-08 MED ORDER — ALBUTEROL SULFATE HFA 108 (90 BASE) MCG/ACT IN AERS
2.0000 | INHALATION_SPRAY | RESPIRATORY_TRACT | Status: DC | PRN
  Administered 2015-06-08: 2 via RESPIRATORY_TRACT
  Filled 2015-06-08: qty 6.7

## 2015-06-08 MED ORDER — ALBUTEROL SULFATE (2.5 MG/3ML) 0.083% IN NEBU
5.0000 mg | INHALATION_SOLUTION | Freq: Once | RESPIRATORY_TRACT | Status: AC
  Administered 2015-06-08: 5 mg via RESPIRATORY_TRACT
  Filled 2015-06-08: qty 6

## 2015-06-08 MED ORDER — SODIUM CHLORIDE 0.9 % IV BOLUS (SEPSIS)
1000.0000 mL | INTRAVENOUS | Status: AC
  Administered 2015-06-08 (×2): 1000 mL via INTRAVENOUS

## 2015-06-08 MED ORDER — ACETAMINOPHEN 325 MG PO TABS
ORAL_TABLET | ORAL | Status: AC
  Filled 2015-06-08: qty 2

## 2015-06-08 NOTE — Discharge Instructions (Signed)
1. Medications: Alternate Tylenol and Motrin every 4 hours for fever and bodyaches, usual home medications 2. Treatment: rest, drink plenty of fluids,  3. Follow Up: Please followup with your primary doctor in 24 hours for discussion of your diagnoses and further evaluation after today's visit; if you do not have a primary care doctor use the resource guide provided to find one; Please return to the ER for her worsening symptoms, shortness of breath or other concerns.   Influenza, Adult Influenza ("the flu") is a viral infection of the respiratory tract. It occurs more often in winter months because people spend more time in close contact with one another. Influenza can make you feel very sick. Influenza easily spreads from person to person (contagious). CAUSES  Influenza is caused by a virus that infects the respiratory tract. You can catch the virus by breathing in droplets from an infected person's cough or sneeze. You can also catch the virus by touching something that was recently contaminated with the virus and then touching your mouth, nose, or eyes. RISKS AND COMPLICATIONS You may be at risk for a more severe case of influenza if you smoke cigarettes, have diabetes, have chronic heart disease (such as heart failure) or lung disease (such as asthma), or if you have a weakened immune system. Elderly people and pregnant women are also at risk for more serious infections. The most common problem of influenza is a lung infection (pneumonia). Sometimes, this problem can require emergency medical care and may be life threatening. SIGNS AND SYMPTOMS  Symptoms typically last 4 to 10 days and may include:  Fever.  Chills.  Headache, body aches, and muscle aches.  Sore throat.  Chest discomfort and cough.  Poor appetite.  Weakness or feeling tired.  Dizziness.  Nausea or vomiting. DIAGNOSIS  Diagnosis of influenza is often made based on your history and a physical exam. A nose or throat  swab test can be done to confirm the diagnosis. TREATMENT  In mild cases, influenza goes away on its own. Treatment is directed at relieving symptoms. For more severe cases, your health care provider may prescribe antiviral medicines to shorten the sickness. Antibiotic medicines are not effective because the infection is caused by a virus, not by bacteria. HOME CARE INSTRUCTIONS  Take medicines only as directed by your health care provider.  Use a cool mist humidifier to make breathing easier.  Get plenty of rest until your temperature returns to normal. This usually takes 3 to 4 days.  Drink enough fluid to keep your urine clear or pale yellow.  Cover yourmouth and nosewhen coughing or sneezing,and wash your handswellto prevent thevirusfrom spreading.  Stay homefromwork orschool untilthe fever is gonefor at least 74full day. PREVENTION  An annual influenza vaccination (flu shot) is the best way to avoid getting influenza. An annual flu shot is now routinely recommended for all adults in the [place] SEEK MEDICAL CARE IF:  You experiencechest pain, yourcough worsens,or you producemore mucus.  Youhave nausea,vomiting, ordiarrhea.  Your fever returns or gets worse. SEEK IMMEDIATE MEDICAL CARE IF:  You havetrouble breathing, you become short of breath,or your skin ornails becomebluish.  You have severe painor stiffnessin the neck.  You develop a sudden headache, or pain in the face or ear.  You have nausea or vomiting that you cannot control. MAKE SURE YOU:   Understand these instructions.  Will watch your condition.  Will get help right away if you are not doing well or get worse.   This  information is not intended to replace advice given to you by your health care provider. Make sure you discuss any questions you have with your health care provider.   Document Released: 04/08/2000 Document Revised: 05/02/2014 Document Reviewed: 07/11/2011 Elsevier  Interactive Patient Education [employer]

## 2015-06-08 NOTE — ED Notes (Signed)
Pt ambulatory w/ steady gait to restroom. 

## 2015-06-08 NOTE — ED Notes (Addendum)
The patient refused to ambulate with pulse ox.  She states that she does not want to get up until she is discharged.

## 2015-06-08 NOTE — ED Notes (Signed)
Pt c/o headache, cough. Fever, weakness ongoing since Saturday. Pt has tried thera flu and ibuprofen without relief.

## 2015-06-08 NOTE — ED Provider Notes (Signed)
CSN: [id number]     Arrival date & time 06/08/15  4098 History   First MD Initiated Contact with Patient 06/08/15 1006     Chief Complaint  Patient presents with  . Headache  . Cough  . Fever     (Consider location/radiation/quality/duration/timing/severity/associated sxs/prior Treatment) The history is provided by the patient and medical records. No language interpreter was used.     Sandra Bird is a 46 y.o. female  with a hx of cholecystectomy, BTL presents to the Emergency Department complaining of gradual, persistent, progressively worsening generalized weakness with associated fever to 102 at home, congestion, cough, chest tightness, frontal throbbing headache onset 2 days ago. Pt denies getting a flu a shot this year.  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  Pt with associated nausea without vomiting.  No aggravating or alleviating factors.  Pt denies pain, neck stiffness, abdominal pain, nausea, vomiting or diarrhea, weakness, dizziness, syncope, dysuria, hematuria.     Past Medical History  Diagnosis Date  . SVD (spontaneous vaginal delivery)     x 3   Past Surgical History  Procedure Laterality Date  . Cholecystectomy    . Tonsillectomy    . Tubal ligation    . Wisdom tooth extraction    . Dilitation & currettage/hystroscopy with novasure ablation N/A 07/25/2013    Procedure: DILATATION & CURETTAGE/HYSTEROSCOPY WITH NOVASURE ABLATION;  Surgeon: Miguel Aschoff, MD;  Location: [facility];  Service: Gynecology;  Laterality: N/A;   Family History  Problem Relation Age of Onset  . Heart failure Mother   . Heart attack Father    Social History  Substance Use Topics  . Smoking status: Never Smoker   . Smokeless tobacco: Never Used  . Alcohol Use: Yes     Comment: socially   OB History    No data available     Review of Systems  Constitutional: Positive for fever and fatigue. Negative for chills and appetite change.  HENT: Positive for  congestion, rhinorrhea and sinus pressure. Negative for ear discharge, ear pain, mouth sores, postnasal drip and sore throat.   Eyes: Negative for visual disturbance.  Respiratory: Positive for cough, chest tightness, shortness of breath and wheezing. Negative for stridor.   Cardiovascular: Negative for chest pain, palpitations and leg swelling.  Gastrointestinal: Negative for nausea, vomiting, abdominal pain and diarrhea.  Genitourinary: Negative for dysuria, urgency, frequency and hematuria.  Musculoskeletal: Negative for myalgias, back pain, arthralgias and neck stiffness.  Skin: Negative for rash.  Neurological: Positive for headaches. Negative for syncope, light-headedness and numbness.  Hematological: Negative for adenopathy.  Psychiatric/Behavioral: The patient is not nervous/anxious.   All other systems reviewed and are negative.     Allergies  Penicillins  Home Medications   Prior to Admission medications   Medication Sig Start Date End Date Taking? Authorizing Provider  phentermine 37.5 MG capsule Take 37.5 mg by mouth every morning.    Historical Provider, MD  pravastatin (PRAVACHOL) 10 MG tablet Take 10 mg by mouth at bedtime.    Historical Provider, MD  Vitamin D, Ergocalciferol, (DRISDOL) 50000 UNITS CAPS capsule Take 50,000 Units by mouth every 7 (seven) days. Monday    Historical Provider, MD   BP 143/71 mmHg  Pulse 108  Temp(Src) 102.7 F (39.3 C) (Oral)  Resp 19  Ht  (1.6 m)  Wt 104.327 kg  BMI 40.75 kg/m2  SpO2 96% Physical Exam  Constitutional: She is oriented to person, place, and time.  She appears well-developed and well-nourished. No distress.  HENT:  Head: Normocephalic and atraumatic.  Right Ear: Tympanic membrane, external ear and ear canal normal.  Left Ear: Tympanic membrane, external ear and ear canal normal.  Nose: Mucosal edema and rhinorrhea present. No epistaxis. Right sinus exhibits no maxillary sinus tenderness and no frontal sinus  tenderness. Left sinus exhibits no maxillary sinus tenderness and no frontal sinus tenderness.  Mouth/Throat: Uvula is midline, oropharynx is clear and moist and mucous membranes are normal. Mucous membranes are not pale and not cyanotic. No oropharyngeal exudate, posterior oropharyngeal edema, posterior oropharyngeal erythema or tonsillar abscesses.  Eyes: Conjunctivae and EOM are normal. Pupils are equal, round, and reactive to light. No scleral icterus.  No horizontal, vertical or rotational nystagmus  Neck: Normal range of motion and full passive range of motion without pain. Neck supple.  Full active and passive ROM without pain No midline or paraspinal tenderness No nuchal rigidity or meningeal signs  Cardiovascular: Regular rhythm and intact distal pulses.  Tachycardia present.   Pulses:      Radial pulses are 2+ on the right side, and 2+ on the left side.       Dorsalis pedis pulses are 2+ on the right side, and 2+ on the left side.  Pulmonary/Chest: Accessory muscle usage present. No stridor. Tachypnea noted. No respiratory distress. She has decreased breath sounds. She has wheezes. She has no rales.  Wheezes throughout with diminished breath sounds and mild accessory muscle usage; mild tachypnea noted  Abdominal: Soft. Bowel sounds are normal. There is no tenderness. There is no rebound and no guarding.  Musculoskeletal: Normal range of motion.  Lymphadenopathy:    She has no cervical adenopathy.  Neurological: She is alert and oriented to person, place, and time. She has normal reflexes. No cranial nerve deficit. She exhibits normal muscle tone. Coordination normal.  Mental Status:  Alert, oriented, thought content appropriate. Speech fluent without evidence of aphasia. Able to follow 2 step commands without difficulty.  Cranial Nerves:  II:  Peripheral visual fields grossly normal, pupils equal, round, reactive to light III,IV, VI: ptosis not present, extra-ocular motions intact  bilaterally  V,VII: smile symmetric, facial light touch sensation equal VIII: hearing grossly normal bilaterally  IX,X: midline uvula rise  XI: bilateral shoulder shrug equal and strong XII: midline tongue extension  Motor:  5/5 in upper and lower extremities bilaterally including strong and equal grip strength and dorsiflexion/plantar flexion Sensory: Pinprick and light touch normal in all extremities.  Deep Tendon Reflexes: 2+ and symmetric  Cerebellar: normal finger-to-nose with bilateral upper extremities Gait: normal gait and balance CV: distal pulses palpable throughout   Skin: Skin is warm and dry. No rash noted. She is not diaphoretic.  Psychiatric: She has a normal mood and affect. Her behavior is normal. Judgment and thought content normal.  Nursing note and vitals reviewed.   ED Course  Procedures (including critical care time) Labs Review Labs Reviewed  COMPREHENSIVE METABOLIC PANEL - Abnormal; Notable for the following:    Chloride 99 (*)    Glucose, Bld 120 (*)    AST 53 (*)    ALT 56 (*)    Alkaline Phosphatase 127 (*)    Anion gap 16 (*)    All other components within normal limits  URINALYSIS, ROUTINE W REFLEX MICROSCOPIC (NOT AT [facility]) - Abnormal; Notable for the following:    Hgb urine dipstick MODERATE (*)    Leukocytes, UA TRACE (*)    All other  components within normal limits  INFLUENZA PANEL BY PCR (TYPE A & B, H1N1) - Abnormal; Notable for the following:    Influenza A By PCR POSITIVE (*)    All other components within normal limits  URINE MICROSCOPIC-ADD ON - Abnormal; Notable for the following:    Squamous Epithelial / LPF 0-5 (*)    Bacteria, UA FEW (*)    All other components within normal limits  I-STAT CG4 LACTIC ACID, ED - Abnormal; Notable for the following:    Lactic Acid, Venous 2.96 (*)    All other components within normal limits  CULTURE, BLOOD (ROUTINE X 2)  CULTURE, BLOOD (ROUTINE X 2)  URINE CULTURE  CBC WITH DIFFERENTIAL/PLATELET   I-STAT CG4 LACTIC ACID, ED    Imaging Review Dg Chest 2 View  06/08/2015  CLINICAL DATA:  Cough. EXAM: CHEST  2 VIEW COMPARISON:  None. FINDINGS: Heart size and pulmonary vascularity are normal. Minimal atelectasis at the left lung base posterior medially. Lungs are otherwise clear. No effusions. Minimal thoracic scoliosis. IMPRESSION: Minimal atelectasis at the left lung base. Electronically Signed   By: Francene Boyers M.D.   On: 06/08/2015 10:06   I have personally reviewed and evaluated these images and lab results as part of my medical decision-making.    MDM   Final diagnoses:  Influenza A  Tachycardia  Fever, unspecified fever cause   Rocquel Askren Ederer resents with tachycardia, fever. Patient tested positive for influenza A. She's been given 3 L of fluid with improvement in her heart rate however she does remain tachycardic at 108. She also remains febrile. Patient given the significant and ibuprofen. Initially with wheezing and given albuterol with complete resolution of wheezing. No focal breath sounds. Chest x-ray without evidence of pneumonia. Patient with elevated lactic acid initially however this resolved after fluid administration. Her other labs are reassuring. Discussed with patient persistent tachycardia but otherwise stable vital signs after many hours of monitoring. Offered admission versus discharge and patient which is for discharge home. She will follow-up with her primary care physician within 24 hours or return to the emergency department for worsening symptoms.  Pt is tolerating PO's.  Discussed the cost versus benefit of Tamiflu treatment with the patient.  The patient understands that symptoms are greater than the recommended 24-48 hour window of treatment.  Patient will be discharged with instructions to orally hydrate, rest, and use over-the-counter medications such as anti-inflammatories ibuprofen and Aleve for muscle aches and Tylenol for fever.  Patient will also be  given a cough suppressant.      Dahlia Client Elieser Tetrick, PA-C 06/08/15 1600  Arby Barrette, MD 06/10/15 [phone]

## 2015-06-09 LAB — URINE CULTURE

## 2015-06-11 MED FILL — VIT D2 1.25 MG (50,000 UNIT: 1.25 MG | 28 days supply | Qty: 4 | Fill #2

## 2015-06-11 MED FILL — levoFLOXacin 500 MG TABS: 500 | 10 days supply | Qty: 10 | Fill #0

## 2015-06-13 LAB — CULTURE, BLOOD (ROUTINE X 2)
CULTURE: NO GROWTH
CULTURE: NO GROWTH

## 2015-06-18 MED FILL — PHENTERMINE 37.5 MG TABLET: 37.5 | 30 days supply | Qty: 30 | Fill #0

## 2015-06-18 MED FILL — AZITHROMYCIN 250 MG TABLET: 250 | 5 days supply | Qty: 6 | Fill #0

## 2015-07-06 MED FILL — PRAVASTATIN NA 20 MG TAB: 20 | 30 days supply | Qty: 30 | Fill #0

## 2015-07-15 MED FILL — CIPROFLOXACIN HCL 500 MG TA: 500 | 7 days supply | Qty: 14 | Fill #0

## 2015-07-15 MED FILL — VIT D2 1.25 MG (50,000 UNIT: 1.25 MG | 28 days supply | Qty: 4 | Fill #3

## 2015-08-06 MED FILL — PHENTERMINE 37.5 MG TABLET: 37.5 | 30 days supply | Qty: 30 | Fill #0

## 2015-09-14 MED FILL — VIT D2 1.25 MG (50,000 UNIT: 1.25 MG | 28 days supply | Qty: 4 | Fill #4

## 2015-09-14 MED FILL — PHENTERMINE 37.5 MG TABLET: 37.5 | 30 days supply | Qty: 30 | Fill #0

## 2015-09-14 MED FILL — PRAVASTATIN NA 20 MG TAB: 20 | 30 days supply | Qty: 30 | Fill #0

## 2015-09-30 MED FILL — NAPROXEN 500 MG TABLET: 500 | 30 days supply | Qty: 60 | Fill #0

## 2015-10-20 MED FILL — VIT D2 1.25 MG (50,000 UNIT: 1.25 MG | 28 days supply | Qty: 4 | Fill #0

## 2015-11-05 MED FILL — PHENTERMINE 37.5 MG TABLET: 37.5 | 30 days supply | Qty: 30 | Fill #0

## 2015-11-20 MED FILL — CLOBETASOL 0.05% CREAM: 0.05 | 30 days supply | Qty: 60 | Fill #0

## 2015-11-23 MED FILL — VIT D2 1.25 MG (50,000 UNIT: 1.25 MG | 28 days supply | Qty: 4 | Fill #1

## 2015-12-22 MED FILL — PRAVASTATIN NA 20 MG TAB: 20 | 30 days supply | Qty: 30 | Fill #1

## 2015-12-22 MED FILL — PHENTERMINE 37.5 MG TABLET: 37.5 | 30 days supply | Qty: 30 | Fill #0

## 2015-12-22 MED FILL — VIT D2 1.25 MG (50,000 UNIT: 1.25 MG | 28 days supply | Qty: 4 | Fill #2

## 2016-01-19 MED FILL — PHENTERMINE 37.5 MG TABLET: 37.5 | 30 days supply | Qty: 30 | Fill #0

## 2016-01-26 MED FILL — CLOBETASOL 0.05% CREAM: 0.05 | 30 days supply | Qty: 60 | Fill #1

## 2016-02-16 MED FILL — TOPIRAMATE 50 MG TABLET: 50 | 30 days supply | Qty: 30 | Fill #0

## 2016-02-16 MED FILL — PRAVASTATIN NA 20 MG TAB: 20 | 30 days supply | Qty: 30 | Fill #0

## 2016-02-16 MED FILL — PHENTERMINE 15 MG CAPSULE: 15 | 30 days supply | Qty: 30 | Fill #0

## 2016-02-16 MED FILL — TRIAMCINOLONE 0.5% CREAM: 0.5 | 30 days supply | Qty: 60 | Fill #0

## 2016-03-15 MED FILL — CIPROFLOXACIN HCL 500 MG TA: 500 | 7 days supply | Qty: 14 | Fill #0

## 2016-03-15 MED FILL — PHENTERMINE 15 MG CAPSULE: 15 | 30 days supply | Qty: 30 | Fill #0

## 2016-03-15 MED FILL — CLOBETASOL 0.05% CREAM: 0.05 | 15 days supply | Qty: 30 | Fill #0

## 2016-05-19 MED FILL — PRAVASTATIN NA 20 MG TAB: 20 | 30 days supply | Qty: 30 | Fill #1

## 2016-06-28 MED FILL — CIPROFLOXACIN HCL 500 MG TA: 500 | 7 days supply | Qty: 14 | Fill #0

## 2016-06-28 MED FILL — PRAVASTATIN NA 20 MG TAB: 20 | 30 days supply | Qty: 30 | Fill #2

## 2016-06-28 MED FILL — PHENTERMINE 37.5 MG TABLET: 37.5 | 30 days supply | Qty: 30 | Fill #0

## 2016-09-29 MED FILL — CLOBETASOL 0.05% CREAM: 0.05 | 10 days supply | Qty: 15 | Fill #0

## 2016-10-03 MED FILL — PRAVASTATIN NA 20 MG TAB: 20 | 30 days supply | Qty: 30 | Fill #3

## 2016-10-03 MED FILL — PHENTERMINE 37.5 MG TABLET: 37.5 | 30 days supply | Qty: 30 | Fill #0

## 2016-10-13 MED FILL — NAPROXEN 500 MG TABLET: 500 | 30 days supply | Qty: 60 | Fill #0

## 2016-10-13 MED FILL — DICLOFENAC 1.5% TOPICAL SOL: 1.5 | 30 days supply | Qty: 450 | Fill #0

## 2016-10-17 MED FILL — NITROFURANTOIN MONO-MCR 100: 100 | 7 days supply | Qty: 14 | Fill #0

## 2016-10-17 MED FILL — VIT D2 1.25 MG (50,000 UNIT: 1.25 MG | 28 days supply | Qty: 4 | Fill #0

## 2016-10-18 MED FILL — LATANOPROST 0.005% EYE DRP: 0.005 | 50 days supply | Qty: 5 | Fill #0

## 2016-11-03 MED FILL — QSYMIA 3.75 MG-23 MG CAP: 3.75-23 | 14 days supply | Qty: 14 | Fill #0

## 2016-11-14 MED FILL — PRAVASTATIN NA 20 MG TAB: 20 | 30 days supply | Qty: 30 | Fill #0

## 2016-11-21 MED FILL — VIT D2 1.25 MG (50,000 UNIT: 1.25 MG | 28 days supply | Qty: 4 | Fill #1

## 2016-11-28 MED FILL — LATANOPROST 0.005% EYE DRP: 0.005 | 50 days supply | Qty: 5 | Fill #1

## 2016-12-16 MED FILL — VIT D2 1.25 MG (50,000 UNIT: 1.25 MG | 28 days supply | Qty: 4 | Fill #2

## 2016-12-16 MED FILL — PRAVASTATIN NA 20 MG TAB: 20 | 30 days supply | Qty: 30 | Fill #0

## 2016-12-27 MED FILL — LATANOPROST 0.005% EYE DRP: 0.005 | 25 days supply | Qty: 3 | Fill #2

## 2016-12-27 MED FILL — PRAVASTATIN NA 20 MG TAB: 20 | 30 days supply | Qty: 30 | Fill #1

## 2017-01-20 MED FILL — LATANOPROST 0.005% EYE DRP: 0.005 | 25 days supply | Qty: 3 | Fill #3

## 2017-01-20 MED FILL — VIT D2 1.25 MG (50,000 UNIT: 1.25 MG | 28 days supply | Qty: 4 | Fill #0

## 2017-02-07 MED FILL — BENZONATATE 100 MG CAP: 100 | 7 days supply | Qty: 21 | Fill #0

## 2017-02-07 MED FILL — predniSONE 10 MG (21) TBPK: 10 | 6 days supply | Qty: 21 | Fill #0

## 2017-02-08 MED FILL — PHENTERMINE 37.5 MG CAPSULE: 37.5 | 30 days supply | Qty: 30 | Fill #0

## 2017-02-20 MED FILL — LATANOPROST 0.005% EYE DRP: 0.005 | 25 days supply | Qty: 3 | Fill #4

## 2017-02-20 MED FILL — VIT D2 1.25 MG (50,000 UNIT: 1.25 MG | 28 days supply | Qty: 4 | Fill #1

## 2017-03-07 MED FILL — AZITHROMYCIN 250 MG TAB: 250 | 5 days supply | Qty: 6 | Fill #0

## 2017-03-10 MED FILL — PHENTERMINE 37.5 MG CAPSULE: 37.5 | 30 days supply | Qty: 30 | Fill #0

## 2017-03-31 MED FILL — PRAVASTATIN NA 20 MG TAB: 20 | 30 days supply | Qty: 30 | Fill #2

## 2017-03-31 MED FILL — LATANOPROST 0.005% EYE DRP: 0.005 | 25 days supply | Qty: 3 | Fill #5

## 2017-03-31 MED FILL — VIT D2 1.25 MG (50,000 UNIT: 1.25 MG | 28 days supply | Qty: 4 | Fill #2

## 2017-05-03 MED FILL — LATANOPROST 0.005% EYE DRP: 0.005 | 25 days supply | Qty: 3 | Fill #6

## 2017-05-10 MED FILL — VIT D2 1.25 MG (50,000 UNIT: 1.25 MG | 28 days supply | Qty: 4 | Fill #0

## 2017-05-16 MED FILL — SAXENDA 18 MG/3 ML PEN: 18 | 30 days supply | Qty: 15 | Fill #0

## 2017-05-16 MED FILL — PRAVASTATIN SODIUM 20 MG TA: 20 | 30 days supply | Qty: 30 | Fill #0

## 2017-05-24 MED FILL — ATORVASTATIN 20 MG TABLET: 20 | 30 days supply | Qty: 30 | Fill #0

## 2017-05-30 MED FILL — DOXYCYCLINE HYCLATE 100 MG: 100 | 7 days supply | Qty: 14 | Fill #0

## 2017-06-21 MED FILL — VIT D2 1.25 MG (50,000 UNIT: 1.25 MG | 28 days supply | Qty: 4 | Fill #1

## 2017-06-21 MED FILL — NITROFURANTOIN MONO-MCR 100: 100 | 3 days supply | Qty: 6 | Fill #0

## 2017-06-27 MED FILL — ATORVASTATIN 20 MG TABLET: 20 | 30 days supply | Qty: 30 | Fill #1

## 2017-06-27 MED FILL — LATANOPROST 0.005% EYE DRP: 0.005 | 25 days supply | Qty: 3 | Fill #7

## 2017-07-12 MED FILL — SAXENDA 18 MG/3 ML PEN: 18 | 30 days supply | Qty: 15 | Fill #1

## 2017-07-12 MED FILL — VIT D2 1.25 MG (50,000 UNIT: 1.25 MG | 28 days supply | Qty: 4 | Fill #2

## 2017-08-08 MED FILL — ATORVASTATIN 20 MG TABLET: 20 | 30 days supply | Qty: 30 | Fill #2

## 2017-08-08 MED FILL — VIT D2 1.25 MG (50,000 UNIT: 1.25 MG | 28 days supply | Qty: 4 | Fill #0

## 2017-08-08 MED FILL — LATANOPROST 0.005% EYE DRP: 0.005 | 25 days supply | Qty: 3 | Fill #8

## 2017-09-19 MED FILL — ATORVASTATIN 20 MG TABLET: 20 | 30 days supply | Qty: 30 | Fill #3

## 2017-09-19 MED FILL — VIT D2 1.25 MG (50,000 UNIT: 1.25 MG | 28 days supply | Qty: 4 | Fill #1

## 2017-09-21 MED FILL — CLOBETASOL PROPIONATE 0.05: 0.05 | 14 days supply | Qty: 15 | Fill #0

## 2017-09-21 MED FILL — SAXENDA 18 MG/3 ML PEN: 18 | 30 days supply | Qty: 15 | Fill #0

## 2017-10-23 MED FILL — VIT D2 1.25 MG (50,000 UNIT: 1.25 MG | 28 days supply | Qty: 4 | Fill #2

## 2017-10-25 MED FILL — LATANOPROST 0.005% EYE DRP: 0.005 | 25 days supply | Qty: 3 | Fill #0

## 2017-10-31 MED FILL — EUCRISA 2% OINTMENT: 2 | 20 days supply | Qty: 60 | Fill #0

## 2017-11-09 MED FILL — ATORVASTATIN CALCIUM 20 MG: 20 | 30 days supply | Qty: 30 | Fill #0

## 2017-11-13 MED FILL — LATANOPROST 0.005% EYE DRP: 0.005 | 25 days supply | Qty: 3 | Fill #1

## 2017-11-13 MED FILL — SAXENDA 18 MG/3 ML PEN: 18 | 30 days supply | Qty: 15 | Fill #1

## 2017-12-27 MED FILL — SAXENDA 18 MG/3 ML PEN: 18 | 30 days supply | Qty: 15 | Fill #0

## 2017-12-27 MED FILL — ATORVASTATIN CALCIUM 20 MG: 20 | 30 days supply | Qty: 30 | Fill #1

## 2017-12-28 MED FILL — VIT D2 1.25 MG (50,000 UNIT: 1.25 MG | 28 days supply | Qty: 4 | Fill #0

## 2018-02-05 MED FILL — ATORVASTATIN CALCIUM 20 MG: 20 | 30 days supply | Qty: 30 | Fill #2

## 2018-02-09 MED FILL — SAXENDA 18 MG/3 ML PEN: 18 | 30 days supply | Qty: 15 | Fill #0

## 2018-03-02 MED FILL — LATANOPROST 0.005% EYE DRP: 0.005 | 25 days supply | Qty: 3 | Fill #2

## 2018-03-11 MED FILL — ATORVASTATIN CALCIUM 20 MG: 20 | 30 days supply | Qty: 30 | Fill #3

## 2018-03-29 MED FILL — LATANOPROST 0.005% EYE DRP: 0.005 | 25 days supply | Qty: 3 | Fill #3

## 2018-03-29 MED FILL — SAXENDA 18 MG/3 ML PEN: 18 | 30 days supply | Qty: 15 | Fill #0

## 2018-05-03 MED FILL — ATORVASTATIN CALCIUM 20 MG: 20 | 30 days supply | Qty: 30 | Fill #0

## 2018-05-07 MED FILL — LATANOPROST 0.005% EYE DRP: 0.005 | 25 days supply | Qty: 3 | Fill #4

## 2018-05-11 MED FILL — traZODone HCL 50 MG TABS: 50 | 30 days supply | Qty: 30 | Fill #0

## 2018-06-01 MED FILL — SAXENDA 18 MG/3 ML PEN: 18 | 30 days supply | Qty: 15 | Fill #0

## 2018-06-04 MED FILL — ATORVASTATIN CALCIUM 20 MG: 20 | 30 days supply | Qty: 30 | Fill #1

## 2018-06-12 MED FILL — FAMOTIDINE 20 MG TABLET: 20 | 30 days supply | Qty: 30 | Fill #0

## 2018-06-12 MED FILL — LATANOPROST 0.005% EYE DRP: 0.005 | 25 days supply | Qty: 3 | Fill #5

## 2018-07-06 MED FILL — ATORVASTATIN 20 MG TABLET: 20 | 30 days supply | Qty: 30 | Fill #2

## 2018-07-06 MED FILL — LATANOPROST 0.005% EYE DRP: 0.005 | 25 days supply | Qty: 3 | Fill #6

## 2018-07-10 MED FILL — FAMOTIDINE 20 MG TABLET: 20 | 30 days supply | Qty: 30 | Fill #1

## 2018-07-12 DIAGNOSIS — E7800 Pure hypercholesterolemia, unspecified: Secondary | ICD-10-CM | POA: Insufficient documentation

## 2018-07-12 MED FILL — CLOTRIMAZOLE-BETAMETHASONE: 1-0.05 | 20 days supply | Qty: 45 | Fill #0

## 2018-07-12 MED FILL — SAXENDA 18 MG/3 ML PEN: 18 | 30 days supply | Qty: 15 | Fill #0

## 2018-07-17 MED FILL — METRONIDAZOLE 500 MG TABS: 500 | 1 days supply | Qty: 4 | Fill #0

## 2018-08-09 MED FILL — LATANOPROST 0.005% EYE DRP: 0.005 | 25 days supply | Qty: 3 | Fill #7

## 2018-08-10 MED FILL — SAXENDA 18 MG/3 ML PEN: 18 | 30 days supply | Qty: 15 | Fill #0

## 2018-08-10 MED FILL — FAMOTIDINE 40 MG TABLET: 40 | 30 days supply | Qty: 15 | Fill #0

## 2018-08-28 MED FILL — ATORVASTATIN 20 MG TABLET: 20 | 30 days supply | Qty: 30 | Fill #3

## 2018-09-05 MED FILL — LATANOPROST 0.005% EYE DRP: 0.005 | 75 days supply | Qty: 8 | Fill #0

## 2018-09-20 MED FILL — SM ACID REDUCER 20 MG TAB: 20 | 25 days supply | Qty: 25 | Fill #0

## 2018-09-28 MED FILL — CLOTRIMAZOLE-BETAMETHASONE: 1-0.05 | 20 days supply | Qty: 45 | Fill #1

## 2018-10-23 MED FILL — SAXENDA 18 MG/3 ML PEN: 18 | 25 days supply | Qty: 6 | Fill #0

## 2018-10-24 MED FILL — UNIFINE PENTIPS 32GX5/32: 32G X 4 MM | 90 days supply | Qty: 100 | Fill #0

## 2018-10-31 MED FILL — ATORVASTATIN 20 MG TABLET: 20 | 90 days supply | Qty: 90 | Fill #0

## 2018-10-31 MED FILL — CLOTRIMAZOLE-BETAMETHASONE: 1-0.05 | 14 days supply | Qty: 45 | Fill #0

## 2018-11-06 MED FILL — SM ACID REDUCER 20 MG TAB: 20 | 25 days supply | Qty: 25 | Fill #1

## 2018-11-12 MED FILL — LATANOPROST 0.005% EYE DRP: 0.005 | 75 days supply | Qty: 8 | Fill #1

## 2018-12-06 MED FILL — FAMOTIDINE 20 MG TABLET: 20 | 30 days supply | Qty: 30 | Fill #2

## 2018-12-12 MED FILL — METHOCARBAMOL 750 MG TABS: 750 | 10 days supply | Qty: 30 | Fill #0

## 2019-01-11 MED FILL — LATANOPROST 0.005% EYE DRP: 0.005 | 50 days supply | Qty: 5 | Fill #0

## 2019-01-17 MED FILL — FAMOTIDINE 20 MG TABS: 20 | 10 days supply | Qty: 10 | Fill #3

## 2019-02-20 MED FILL — LATANOPROST 0.005% OPTH SOL: 0.005 | 50 days supply | Qty: 5 | Fill #1

## 2019-03-05 MED FILL — LATANOPROST 0.005% OPTH SOL: 0.005 | 50 days supply | Qty: 5 | Fill #1

## 2019-03-05 MED FILL — SAXENDA 18 MG/3 ML PEN: 18 | 30 days supply | Qty: 15 | Fill #1

## 2019-03-27 MED FILL — FAMOTIDINE 20 MG TABS: 20 | 90 days supply | Qty: 90 | Fill #0

## 2019-04-17 MED FILL — LATANOPROST 0.005% OPTH SOL: 0.005 | 50 days supply | Qty: 5 | Fill #2

## 2019-04-17 MED FILL — CLOTRIMAZOLE-BETAMETHASONE: 1-0.05 | 14 days supply | Qty: 45 | Fill #1

## 2019-04-17 MED FILL — SAXENDA 18 MG/3 ML PEN: 18 | 30 days supply | Qty: 15 | Fill #0

## 2019-04-17 MED FILL — ATORVASTATIN 20 MG TABLET: 20 | 90 days supply | Qty: 90 | Fill #1

## 2019-05-20 MED FILL — CELECOXIB 200 MG CAP: 200 | 30 days supply | Qty: 30 | Fill #0

## 2019-06-02 MED FILL — LATANOPROST 0.005% OPTH SOL: 0.005 | 50 days supply | Qty: 5 | Fill #3

## 2019-06-04 MED FILL — AZITHROMYCIN 500 MG TABS: 500 | 3 days supply | Qty: 4 | Fill #0

## 2019-06-04 MED FILL — ATOVAQUONE-PROGUANIL 250-10: 250-100 | 16 days supply | Qty: 16 | Fill #0

## 2019-06-05 MED FILL — CLOTRIMAZOLE-BETAMETHASONE: 1-0.05 | 14 days supply | Qty: 45 | Fill #0

## 2019-06-22 MED FILL — CELECOXIB 200 MG CAP: 200 | 30 days supply | Qty: 30 | Fill #1

## 2019-06-22 MED FILL — FAMOTIDINE 20 MG TABLET: 20 | 30 days supply | Qty: 30 | Fill #0

## 2019-06-25 ENCOUNTER — Ambulatory Visit: Payer: BLUE CROSS/BLUE SHIELD | Attending: Internal Medicine

## 2019-06-25 DIAGNOSIS — Z20822 Contact with and (suspected) exposure to covid-19: Secondary | ICD-10-CM

## 2019-06-26 LAB — NOVEL CORONAVIRUS, NAA: SARS-CoV-2, NAA: NOT DETECTED

## 2019-07-17 MED FILL — CLOTRIMAZOLE-BETAMETHASONE: 1-0.05 | 14 days supply | Qty: 45 | Fill #0

## 2019-07-17 MED FILL — LATANOPROST 0.005% OPTH SOL: 0.005 | 50 days supply | Qty: 5 | Fill #0

## 2019-07-17 MED FILL — NITROFURANTOIN MONO-MCR 100: 100 | 5 days supply | Qty: 10 | Fill #0

## 2019-07-17 MED FILL — METRONIDAZOLE 500 MG TABS: 500 | 1 days supply | Qty: 4 | Fill #0

## 2019-07-22 MED FILL — SULFAMETHOXAZOLE-TMP DS TAB: 800-160 | 7 days supply | Qty: 14 | Fill #0

## 2019-07-26 MED FILL — FAMOTIDINE 20 MG TABLET: 20 | 30 days supply | Qty: 30 | Fill #1

## 2019-07-30 MED FILL — SAXENDA 18 MG/3 ML PEN: 18 | 30 days supply | Qty: 15 | Fill #1

## 2019-08-02 MED FILL — CELECOXIB 200 MG CAP: 200 | 30 days supply | Qty: 30 | Fill #0

## 2019-08-03 MED FILL — ATORVASTATIN 20 MG TABLET: 20 | 90 days supply | Qty: 90 | Fill #2

## 2019-08-30 MED FILL — CELECOXIB 200 MG CAP: 200 | 30 days supply | Qty: 30 | Fill #1

## 2019-08-30 MED FILL — FAMOTIDINE 20 MG TABLET: 20 | 30 days supply | Qty: 30 | Fill #2

## 2019-08-30 MED FILL — LATANOPROST 0.005% OPTH SOL: 0.005 | 50 days supply | Qty: 5 | Fill #1

## 2019-10-08 MED FILL — VIT D2 1.25 MG (50,000 UNIT: 1.25 MG | 28 days supply | Qty: 4 | Fill #0

## 2019-10-08 MED FILL — CELECOXIB 200 MG CAP: 200 | 90 days supply | Qty: 90 | Fill #0

## 2019-10-09 ENCOUNTER — Other Ambulatory Visit (HOSPITAL_COMMUNITY): Payer: Self-pay | Admitting: Optometry

## 2019-10-09 MED FILL — LATANOPROST 0.005% OPTH SOL: 0.005 | 50 days supply | Qty: 5 | Fill #0

## 2019-11-04 MED FILL — VIT D2 1.25 MG (50,000 UNIT: 1.25 MG | 28 days supply | Qty: 4 | Fill #0

## 2019-11-18 MED FILL — LATANOPROST 0.005% OPTH SOL: 0.005 | 50 days supply | Qty: 5 | Fill #0

## 2019-11-25 MED FILL — VIT D2 1.25 MG (50,000 UNIT: 1.25 MG | 28 days supply | Qty: 4 | Fill #0

## 2019-11-26 ENCOUNTER — Other Ambulatory Visit (HOSPITAL_COMMUNITY): Payer: Self-pay | Admitting: Obstetrics and Gynecology

## 2019-11-26 MED FILL — CLOTRIMAZOLE-BETAMETHASONE: 1-0.05 | 14 days supply | Qty: 45 | Fill #1

## 2019-12-24 ENCOUNTER — Other Ambulatory Visit (HOSPITAL_COMMUNITY): Payer: Self-pay | Admitting: Physician Assistant

## 2019-12-24 MED FILL — CLOBETASOL PROPIONATE 0.05: 0.05 | 30 days supply | Qty: 60 | Fill #0

## 2019-12-24 MED FILL — UNIFINE PENTIPS 32GX5/32: 32G X 4 MM | 90 days supply | Qty: 100 | Fill #0

## 2019-12-24 MED FILL — VIT D2 1.25 MG (50,000 UNIT: 1.25 MG | 84 days supply | Qty: 12 | Fill #0

## 2019-12-26 MED FILL — SAXENDA 18 MG/3 ML PEN: 18 | 30 days supply | Qty: 15 | Fill #0

## 2020-01-06 ENCOUNTER — Other Ambulatory Visit (HOSPITAL_COMMUNITY): Payer: Self-pay | Admitting: Cardiology

## 2020-01-06 MED FILL — ATORVASTATIN CALCIUM 20 MG: 20 | 90 days supply | Qty: 90 | Fill #0

## 2020-01-16 MED FILL — PEG-3350 SOLUTION: 420 | 1 days supply | Qty: 4000 | Fill #0

## 2020-01-16 MED FILL — LATANOPROST 0.005% OPTH SOL: 0.005 | 50 days supply | Qty: 5 | Fill #1

## 2020-01-22 ENCOUNTER — Other Ambulatory Visit (HOSPITAL_COMMUNITY): Payer: Self-pay | Admitting: Gastroenterology

## 2020-01-22 LAB — HM COLONOSCOPY

## 2020-01-22 MED FILL — LINZESS 145 MCG CAPSULE: 145 | 30 days supply | Qty: 30 | Fill #0

## 2020-02-04 ENCOUNTER — Other Ambulatory Visit (HOSPITAL_COMMUNITY): Payer: Self-pay | Admitting: Physician Assistant

## 2020-02-05 ENCOUNTER — Other Ambulatory Visit (HOSPITAL_COMMUNITY): Payer: Self-pay | Admitting: Obstetrics and Gynecology

## 2020-02-05 MED FILL — CLOTRIMAZOLE-BETAMETHASONE: 1-0.05 | 14 days supply | Qty: 45 | Fill #0

## 2020-02-06 MED FILL — SAXENDA 18 MG/3 ML PEN: 18 | 30 days supply | Qty: 15 | Fill #0

## 2020-02-19 ENCOUNTER — Other Ambulatory Visit (HOSPITAL_COMMUNITY): Payer: Self-pay | Admitting: Nurse Practitioner

## 2020-02-19 MED FILL — OFLOXACIN 0.3% EYE DROPS: 0.3 | 5 days supply | Qty: 5 | Fill #0

## 2020-03-04 ENCOUNTER — Other Ambulatory Visit (HOSPITAL_COMMUNITY): Payer: Self-pay | Admitting: Obstetrics and Gynecology

## 2020-03-04 MED FILL — CLOTRIMAZOLE/BETAMETH CREAM: 1-0.05 | 14 days supply | Qty: 45 | Fill #0

## 2020-03-04 MED FILL — LATANOPROST 0.005% OPTH SOL: 0.005 | 50 days supply | Qty: 5 | Fill #1

## 2020-03-09 MED FILL — VIT D2 1.25 MG (50,000 UNIT: 1.25 MG | 84 days supply | Qty: 12 | Fill #1

## 2020-03-10 ENCOUNTER — Other Ambulatory Visit: Payer: Self-pay | Admitting: Orthopaedic Surgery

## 2020-03-12 ENCOUNTER — Encounter (HOSPITAL_COMMUNITY): Payer: Self-pay

## 2020-03-12 NOTE — Progress Notes (Addendum)
PCP - Donald Siva Cardiologist - Dr. Rinaldo Cloud no cardiac hx  Has family history of CHF in mom and dad  PPM/ICD -  Device Orders -  Rep Notified -   Chest x-ray -  EKG - 03-17-20 Stress Test -  ECHO -  Cardiac Cath -   Sleep Study -  CPAP -   Fasting Blood Sugar -  Checks Blood Sugar _____ times a day  Blood Thinner Instructions: Aspirin Instructions:    ERAS Protcol - PRE-SURGERY Ensure or G2-   COVID TEST- 03-20-20  Activity-  Activity can walk a flight of stairs without SOB or Cp  Anesthesia review: UA , Roted to [facility] via epic  Patient denies shortness of breath, fever, cough and chest pain at PAT appointment  NONE   All instructions explained to the patient, with a verbal understanding of the material. Patient agrees to go over the instructions while at home for a better understanding. Patient also instructed to self quarantine after being tested for COVID-19. The opportunity to ask questions was provided.

## 2020-03-12 NOTE — Patient Instructions (Signed)
DUE TO COVID-19 ONLY ONE VISITOR IS ALLOWED TO COME WITH YOU AND STAY IN THE WAITING ROOM ONLY DURING PRE OP AND PROCEDURE DAY OF SURGERY. THE 1 VISITOR  MAY VISIT WITH YOU AFTER SURGERY IN YOUR PRIVATE ROOM DURING VISITING HOURS ONLY!  YOU NEED TO HAVE A COVID 19 TEST ON_11-26______ @_______ , THIS TEST MUST BE DONE BEFORE SURGERY,  COVID TESTING SITE [address] [place] , IT IS ON THE RIGHT GOING OUT [address] APPROXIMATELY  2 MINUTES PAST [employer] ON THE RIGHT. ONCE YOUR COVID TEST IS COMPLETED,  PLEASE BEGIN THE QUARANTINE INSTRUCTIONS AS OUTLINED IN YOUR HANDOUT.                Sandra Bird  03/12/2020   Your procedure is scheduled on: 03-24-20   Report to [facility] Main  Entrance   Report to admitting at        1230  PM     Call this number if you have problems the morning of surgery [phone]    Remember: NO SOLID FOOD AFTER MIDNIGHT THE NIGHT PRIOR TO SURGERY. NOTHING BY MOUTH EXCEPT CLEAR LIQUIDS UNTIL      1200 pm . PLEASE FINISH ENSURE DRINK PER SURGEON ORDER  WHICH NEEDS TO BE COMPLETED AT   12 pm then nothing by mouth  .     CLEAR LIQUID DIET   Foods Allowed                                                                                Foods Excluded  BLACK Coffee and tea, regular and decaf                             liquids that you cannot  Plain Jell-O any favor except red or purple                                           see through such as: Fruit ices (not with fruit pulp)                                                 milk, soups, orange juice  Iced Popsicles                                                   All solid food Carbonated beverages, regular and diet                                    Cranberry, grape and apple juices Sports drinks like Gatorade Lightly seasoned clear broth or consume(fat free) Sugar, honey  syrup   _____________________________________________________________________  BRUSH YOUR TEETH MORNING OF SURGERY AND RINSE YOUR MOUTH OUT, NO CHEWING GUM CANDY OR MINTS.     Take these medicines the morning of surgery with A SIP OF WATER: lipitor  DO NOT TAKE ANY DIABETIC MEDICATIONS DAY OF YOUR SURGERY                               You may not have any metal on your body including hair pins and              piercings  Do not wear jewelry, make-up, lotions, powders or perfumes, deodorant             Do not wear nail polish on your fingernails.  Do not shave  48 hours prior to surgery.            Do not bring valuables to the hospital.  IS NOT             RESPONSIBLE   FOR VALUABLES.  Contacts, dentures or bridgework may not be worn into surgery.      Patients discharged the day of surgery will not be allowed to drive home. IF YOU ARE HAVING SURGERY AND GOING HOME THE SAME DAY, YOU MUST HAVE AN ADULT TO DRIVE YOU HOME AND BE WITH YOU FOR 24 HOURS. YOU MAY GO HOME BY TAXI OR UBER OR ORTHERWISE, BUT AN ADULT MUST ACCOMPANY YOU HOME AND STAY WITH YOU FOR 24 HOURS.  Name and phone number of your driver:  Special Instructions: N/A              Please read over the following fact sheets you were given: _____________________________________________________________________             [facility] - Preparing for Surgery Before surgery, you can play an important role.  Because skin is not sterile, your skin needs to be as free of germs as possible.  You can reduce the number of germs on your skin by washing with CHG (chlorahexidine gluconate) soap before surgery.  CHG is an antiseptic cleaner which kills germs and bonds with the skin to continue killing germs even after washing. Please DO NOT use if you have an allergy to CHG or antibacterial soaps.  If your skin becomes reddened/irritated stop using the CHG and inform your nurse when you arrive at Short Stay. Do not shave  (including legs and underarms) for at least 48 hours prior to the first CHG shower.  You may shave your face/neck. Please follow these instructions carefully:  1.  Shower with CHG Soap the night before surgery and the  morning of Surgery.  2.  If you choose to wash your hair, wash your hair first as usual with your  normal  shampoo.  3.  After you shampoo, rinse your hair and body thoroughly to remove the  shampoo.                           4.  Use CHG as you would any other liquid soap.  You can apply chg directly  to the skin and wash                       Gently with a scrungie or clean washcloth.  5.  Apply the CHG Soap to your body ONLY FROM THE NECK DOWN.   Do not use  on face/ open                           Wound or open sores. Avoid contact with eyes, ears mouth and genitals (private parts).                       Wash face,  Genitals (private parts) with your normal soap.             6.  Wash thoroughly, paying special attention to the area where your surgery  will be performed.  7.  Thoroughly rinse your body with warm water from the neck down.  8.  DO NOT shower/wash with your normal soap after using and rinsing off  the CHG Soap.                9.  Pat yourself dry with a clean towel.            10.  Wear clean pajamas.            11.  Place clean sheets on your bed the night of your first shower and do not  sleep with pets. Day of Surgery : Do not apply any lotions/deodorants the morning of surgery.  Please wear clean clothes to the hospital/surgery center.  FAILURE TO FOLLOW THESE INSTRUCTIONS MAY RESULT IN THE CANCELLATION OF YOUR SURGERY PATIENT SIGNATURE_________________________________  NURSE SIGNATURE__________________________________  ________________________________________________________________________   Sandra Bird  An incentive spirometer is a tool that can help keep your lungs clear and active. This tool measures how well you are filling your lungs with  each breath. Taking long deep breaths may help reverse or decrease the chance of developing breathing (pulmonary) problems (especially infection) following:  A long period of time when you are unable to move or be active. BEFORE THE PROCEDURE   If the spirometer includes an indicator to show your best effort, your nurse or respiratory therapist will set it to a desired goal.  If possible, sit up straight or lean slightly forward. Try not to slouch.  Hold the incentive spirometer in an upright position. INSTRUCTIONS FOR USE  1. Sit on the edge of your bed if possible, or sit up as far as you can in bed or on a chair. 2. Hold the incentive spirometer in an upright position. 3. Breathe out normally. 4. Place the mouthpiece in your mouth and seal your lips tightly around it. 5. Breathe in slowly and as deeply as possible, raising the piston or the ball toward the top of the column. 6. Hold your breath for 3-5 seconds or for as long as possible. Allow the piston or ball to fall to the bottom of the column. 7. Remove the mouthpiece from your mouth and breathe out normally. 8. Rest for a few seconds and repeat Steps 1 through 7 at least 10 times every 1-2 hours when you are awake. Take your time and take a few normal breaths between deep breaths. 9. The spirometer may include an indicator to show your best effort. Use the indicator as a goal to work toward during each repetition. 10. After each set of 10 deep breaths, practice coughing to be sure your lungs are clear. If you have an incision (the cut made at the time of surgery), support your incision when coughing by placing a pillow or rolled up towels firmly against it. Once you are able to get out of bed, walk around  indoors and cough well. You may stop using the incentive spirometer when instructed by your caregiver.  RISKS AND COMPLICATIONS  Take your time so you do not get dizzy or light-headed.  If you are in pain, you may need to take or  ask for pain medication before doing incentive spirometry. It is harder to take a deep breath if you are having pain. AFTER USE  Rest and breathe slowly and easily.  It can be helpful to keep track of a log of your progress. Your caregiver can provide you with a simple table to help with this. If you are using the spirometer at home, follow these instructions: SEEK MEDICAL CARE IF:   You are having difficultly using the spirometer.  You have trouble using the spirometer as often as instructed.  Your pain medication is not giving enough relief while using the spirometer.  You develop fever of 100.5 F (38.1 C) or higher. SEEK IMMEDIATE MEDICAL CARE IF:   You cough up bloody sputum that had not been present before.  You develop fever of 102 F (38.9 C) or greater.  You develop worsening pain at or near the incision site. MAKE SURE YOU:   Understand these instructions.  Will watch your condition.  Will get help right away if you are not doing well or get worse. Document Released: 08/22/2006 Document Revised: 07/04/2011 Document Reviewed: 10/23/2006 [facility] Patient Information 2014 [place], [place].   ________________________________________________________________________

## 2020-03-16 ENCOUNTER — Encounter (HOSPITAL_COMMUNITY)
Admission: RE | Admit: 2020-03-16 | Discharge: 2020-03-16 | Disposition: A | Discharge status: Home / Self Care | Payer: BC Managed Care – PPO | Source: Ambulatory Visit | Attending: Orthopaedic Surgery | Admitting: Orthopaedic Surgery

## 2020-03-16 ENCOUNTER — Other Ambulatory Visit (HOSPITAL_COMMUNITY): Payer: Self-pay | Admitting: Physician Assistant

## 2020-03-16 ENCOUNTER — Ambulatory Visit (HOSPITAL_COMMUNITY)
Admission: RE | Admit: 2020-03-16 | Discharge: 2020-03-16 | Disposition: A | Discharge status: Home / Self Care | Payer: BC Managed Care – PPO | Source: Ambulatory Visit | Attending: Orthopaedic Surgery | Admitting: Orthopaedic Surgery

## 2020-03-16 ENCOUNTER — Other Ambulatory Visit: Payer: Self-pay

## 2020-03-16 ENCOUNTER — Encounter (HOSPITAL_COMMUNITY): Payer: Self-pay

## 2020-03-16 DIAGNOSIS — Z01818 Encounter for other preprocedural examination: Secondary | ICD-10-CM | POA: Insufficient documentation

## 2020-03-16 HISTORY — DX: Unspecified osteoarthritis, unspecified site: M19.90

## 2020-03-16 LAB — CBC WITH DIFFERENTIAL/PLATELET
Abs Immature Granulocytes: 0.01 10*3/uL (ref 0.00–0.07)
Basophils Absolute: 0 10*3/uL (ref 0.0–0.1)
Basophils Relative: 1 %
Eosinophils Absolute: 0.5 10*3/uL (ref 0.0–0.5)
Eosinophils Relative: 6 %
HCT: 41.4 % (ref 36.0–46.0)
Hemoglobin: 13 g/dL (ref 12.0–15.0)
Immature Granulocytes: 0 %
Lymphocytes Relative: 32 %
Lymphs Abs: 2.8 10*3/uL (ref 0.7–4.0)
MCH: 27.3 pg (ref 26.0–34.0)
MCHC: 31.4 g/dL (ref 30.0–36.0)
MCV: 87 fL (ref 80.0–100.0)
Monocytes Absolute: 0.5 10*3/uL (ref 0.1–1.0)
Monocytes Relative: 6 %
Neutro Abs: 4.8 10*3/uL (ref 1.7–7.7)
Neutrophils Relative %: 55 %
Platelets: 319 10*3/uL (ref 150–400)
RBC: 4.76 MIL/uL (ref 3.87–5.11)
RDW: 14.2 % (ref 11.5–15.5)
WBC: 8.6 10*3/uL (ref 4.0–10.5)
nRBC: 0 % (ref 0.0–0.2)

## 2020-03-16 LAB — PROTIME-INR
INR: 1 (ref 0.8–1.2)
Prothrombin Time: 12.7 seconds (ref 11.4–15.2)

## 2020-03-16 LAB — URINALYSIS, ROUTINE W REFLEX MICROSCOPIC
Bilirubin Urine: NEGATIVE
Glucose, UA: NEGATIVE mg/dL
Ketones, ur: NEGATIVE mg/dL
Nitrite: NEGATIVE
Protein, ur: NEGATIVE mg/dL
Specific Gravity, Urine: 1.018 (ref 1.005–1.030)
pH: 6 (ref 5.0–8.0)

## 2020-03-16 LAB — BASIC METABOLIC PANEL
Anion gap: 10 (ref 5–15)
BUN: 14 mg/dL (ref 6–20)
CO2: 29 mmol/L (ref 22–32)
Calcium: 9.3 mg/dL (ref 8.9–10.3)
Chloride: 102 mmol/L (ref 98–111)
Creatinine, Ser: 0.67 mg/dL (ref 0.44–1.00)
GFR, Estimated: >60 mL/min (ref >60)
Glucose, Bld: 101 mg/dL — ABNORMAL HIGH (ref 70–99)
Potassium: 4.4 mmol/L (ref 3.5–5.1)
Sodium: 141 mmol/L (ref 135–145)

## 2020-03-16 LAB — APTT: aPTT: 29 seconds (ref 24–36)

## 2020-03-16 LAB — SURGICAL PCR SCREEN
MRSA, PCR: NEGATIVE
Staphylococcus aureus: NEGATIVE

## 2020-03-16 MED FILL — PHENTERMINE 37.5 MG TABLET: 37.5 | 30 days supply | Qty: 30 | Fill #0

## 2020-03-18 ENCOUNTER — Other Ambulatory Visit (HOSPITAL_COMMUNITY): Payer: Self-pay | Admitting: Orthopedic Surgery

## 2020-03-18 MED FILL — CIPROFLOXACIN HCL 500 MG TA: 500 | 5 days supply | Qty: 10 | Fill #0

## 2020-03-20 ENCOUNTER — Other Ambulatory Visit (HOSPITAL_COMMUNITY)
Admission: RE | Admit: 2020-03-20 | Discharge: 2020-03-20 | Disposition: A | Discharge status: Home / Self Care | Payer: BLUE CROSS/BLUE SHIELD | Source: Ambulatory Visit | Attending: Orthopaedic Surgery | Admitting: Orthopaedic Surgery

## 2020-03-20 DIAGNOSIS — Z20822 Contact with and (suspected) exposure to covid-19: Secondary | ICD-10-CM | POA: Insufficient documentation

## 2020-03-20 DIAGNOSIS — Z01812 Encounter for preprocedural laboratory examination: Secondary | ICD-10-CM | POA: Diagnosis present

## 2020-03-20 LAB — SARS CORONAVIRUS 2 (TAT 6-24 HRS): SARS Coronavirus 2: NEGATIVE

## 2020-03-23 MED ORDER — VANCOMYCIN HCL 1500 MG/300ML IV SOLN
1500.0000 mg | INTRAVENOUS | Status: AC
  Administered 2020-03-24: 1500 mg via INTRAVENOUS
  Filled 2020-03-23: qty 300

## 2020-03-23 MED ORDER — BUPIVACAINE LIPOSOME 1.3 % IJ SUSP
10.0000 mL | Freq: Once | INTRAMUSCULAR | Status: DC
  Filled 2020-03-23: qty 10

## 2020-03-23 MED ORDER — TRANEXAMIC ACID 1000 MG/10ML IV SOLN
2000.0000 mg | INTRAVENOUS | Status: DC
  Filled 2020-03-23: qty 20

## 2020-03-23 NOTE — H&P (Signed)
TOTAL HIP ADMISSION H&P  Patient is admitted for right total hip arthroplasty.  Subjective:  Chief Complaint: right hip pain  HPI: Sandra Bird, 49 y.o. female, has a history of pain and functional disability in the right hip(s) due to arthritis and patient has failed non-surgical conservative treatments for greater than 12 weeks to include NSAID's and/or analgesics, corticosteriod injections, flexibility and strengthening excercises, supervised PT with diminished ADL's post treatment, use of assistive devices, weight reduction as appropriate and activity modification.  Onset of symptoms was gradual starting 5 years ago with gradually worsening course since that time.The patient noted no past surgery on the right hip(s).  Patient currently rates pain in the right hip at 10 out of 10 with activity. Patient has night pain, worsening of pain with activity and weight bearing, trendelenberg gait, pain that interfers with activities of daily living and crepitus. Patient has evidence of subchondral cysts, subchondral sclerosis, periarticular osteophytes and joint space narrowing by imaging studies. This condition presents safety issues increasing the risk of falls. There is no current active infection.  Patient Active Problem List   Diagnosis Date Noted  . OBSTRUCTIVE SLEEP APNEA 05/27/2009   Past Medical History:  Diagnosis Date  . Arthritis   . SVD (spontaneous vaginal delivery)    x 3    Past Surgical History:  Procedure Laterality Date  . CHOLECYSTECTOMY    . DILITATION & CURRETTAGE/HYSTROSCOPY WITH NOVASURE ABLATION N/A 07/25/2013   Procedure: DILATATION & CURETTAGE/HYSTEROSCOPY WITH NOVASURE ABLATION;  Surgeon: Miguel Aschoff, MD;  Location: [facility];  Service: Gynecology;  Laterality: N/A;  . TONSILLECTOMY    . TUBAL LIGATION    . WISDOM TOOTH EXTRACTION      Current Facility-Administered Medications  Medication Dose Route Frequency Provider Last Rate Last Admin  . [START ON 03/24/2020]  bupivacaine liposome (EXPAREL) 1.3 % injection 133 mg  10 mL Other Once Marcene Corning, MD      . Melene Muller ON 03/24/2020] tranexamic acid (CYKLOKAPRON) 2,000 mg in sodium chloride 0.9 % 50 mL Topical Application  2,000 mg Topical To OR Marcene Corning, MD      . Melene Muller ON 03/24/2020] vancomycin (VANCOREADY) IVPB 1500 mg/300 mL  1,500 mg Intravenous On Call to OR Marcene Corning, MD       Current Outpatient Medications  Medication Sig Dispense Refill Last Dose  . atorvastatin (LIPITOR) 20 MG tablet Take 20 mg by mouth daily.     Marland Kitchen latanoprost (XALATAN) 0.005 % ophthalmic solution Place 1 drop into both eyes at bedtime.     . Multiple Vitamin (MULTIVITAMIN WITH MINERALS) TABS tablet Take 1 tablet by mouth daily.     . naproxen sodium (ALEVE) 220 MG tablet Take 220 mg by mouth 2 (two) times daily as needed (pain).     Marland Kitchen SAXENDA 18 MG/3ML SOPN Inject 3 mg into the skin daily.      Marland Kitchen aspirin EC 81 MG tablet Take 81 mg by mouth daily as needed. Swallow whole.     . phentermine 37.5 MG capsule Take 37.5 mg by mouth every morning. (Patient not taking: Reported on 03/11/2020)   Not Taking at Unknown time  . pravastatin (PRAVACHOL) 10 MG tablet Take 10 mg by mouth at bedtime. (Patient not taking: Reported on 03/11/2020)   Not Taking at Unknown time  . Vitamin D, Ergocalciferol, (DRISDOL) 50000 UNITS CAPS capsule Take 50,000 Units by mouth every 7 (seven) days. Monday (Patient not taking: Reported on 03/11/2020)   Not Taking at Unknown  time   Allergies  Allergen Reactions  . Penicillins Rash    Has patient had a PCN reaction causing immediate rash, facial/tongue/throat swelling, SOB or lightheadedness with hypotension: Yes Has patient had a PCN reaction causing severe rash involving mucus membranes or skin necrosis: No Has patient had a PCN reaction that required hospitalization No Has patient had a PCN reaction occurring within the last 10 years: No If all of the above answers are "NO", then may proceed  with Cephalosporin use.     Social History   Tobacco Use  . Smoking status: Never Smoker  . Smokeless tobacco: Never Used  Substance Use Topics  . Alcohol use: Yes    Comment: socially    Family History  Problem Relation Age of Onset  . Heart failure Mother   . Heart attack Father      Review of Systems  Musculoskeletal: Positive for arthralgias.       Right hip  All other systems reviewed and are negative.   Objective:  Physical Exam Constitutional:      Appearance: Normal appearance.  HENT:     Head: Normocephalic and atraumatic.     Nose: Nose normal.     Mouth/Throat:     Pharynx: Oropharynx is clear.  Eyes:     Extraocular Movements: Extraocular movements intact.  Cardiovascular:     Rate and Rhythm: Normal rate and regular rhythm.  Pulmonary:     Effort: Pulmonary effort is normal.  Abdominal:     Palpations: Abdomen is soft.  Musculoskeletal:     Cervical back: Normal range of motion.     Comments: Right hip motion is painful in internal rotation and a bit limited overall.  Leg lengths are roughly equal.  Both of her knees move about 0-110.    Skin:    General: Skin is warm and dry.  Neurological:     General: No focal deficit present.     Mental Status: She is alert and oriented to person, place, and time.  Psychiatric:        Mood and Affect: Mood normal.        Behavior: Behavior normal.        Thought Content: Thought content normal.        Judgment: Judgment normal.     Vital signs in last 24 hours:    Labs:   Estimated body mass index is 45.7 kg/m as calculated from the following:   Height as of 03/16/20: 5\' 3"  (1.6 m).   Weight as of 03/16/20: 117 kg.   Imaging Review Plain radiographs demonstrate severe degenerative joint disease of the right hip(s). The bone quality appears to be good for age and reported activity level.      Assessment/Plan:  End stage primary arthritis, right hip(s)  The patient history, physical  examination, clinical judgement of the provider and imaging studies are consistent with end stage degenerative joint disease of the right hip(s) and total hip arthroplasty is deemed medically necessary. The treatment options including medical management, injection therapy, arthroscopy and arthroplasty were discussed at length. The risks and benefits of total hip arthroplasty were presented and reviewed. The risks due to aseptic loosening, infection, stiffness, dislocation/subluxation,  thromboembolic complications and other imponderables were discussed.  The patient acknowledged the explanation, agreed to proceed with the plan and consent was signed. Patient is being admitted for inpatient treatment for surgery, pain control, PT, OT, prophylactic antibiotics, VTE prophylaxis, progressive ambulation and ADL's and discharge planning.The patient  is planning to be discharged home with home health services

## 2020-03-24 ENCOUNTER — Encounter (HOSPITAL_COMMUNITY): Payer: Self-pay | Admitting: Orthopaedic Surgery

## 2020-03-24 ENCOUNTER — Observation Stay (HOSPITAL_COMMUNITY)
Admission: RE | Admit: 2020-03-24 | Discharge: 2020-03-25 | Disposition: A | Discharge status: Home / Self Care | Payer: No Typology Code available for payment source | Attending: Orthopaedic Surgery | Admitting: Orthopaedic Surgery

## 2020-03-24 ENCOUNTER — Ambulatory Visit (HOSPITAL_COMMUNITY): Payer: No Typology Code available for payment source

## 2020-03-24 ENCOUNTER — Other Ambulatory Visit: Payer: Self-pay

## 2020-03-24 ENCOUNTER — Encounter (HOSPITAL_COMMUNITY)
Admission: RE | Disposition: A | Discharge status: Home / Self Care | Payer: Self-pay | Source: Home / Self Care | Attending: Orthopaedic Surgery

## 2020-03-24 ENCOUNTER — Ambulatory Visit (HOSPITAL_COMMUNITY): Payer: No Typology Code available for payment source | Admitting: Certified Registered Nurse Anesthetist

## 2020-03-24 DIAGNOSIS — M1611 Unilateral primary osteoarthritis, right hip: Principal | ICD-10-CM | POA: Insufficient documentation

## 2020-03-24 DIAGNOSIS — Z419 Encounter for procedure for purposes other than remedying health state, unspecified: Secondary | ICD-10-CM

## 2020-03-24 DIAGNOSIS — Z79899 Other long term (current) drug therapy: Secondary | ICD-10-CM | POA: Diagnosis not present

## 2020-03-24 DIAGNOSIS — Z7982 Long term (current) use of aspirin: Secondary | ICD-10-CM | POA: Diagnosis not present

## 2020-03-24 DIAGNOSIS — M25551 Pain in right hip: Secondary | ICD-10-CM | POA: Diagnosis present

## 2020-03-24 HISTORY — PX: TOTAL HIP ARTHROPLASTY: SHX124

## 2020-03-24 LAB — ABO/RH: ABO/RH(D): B POS

## 2020-03-24 LAB — TYPE AND SCREEN
ABO/RH(D): B POS
Antibody Screen: NEGATIVE

## 2020-03-24 SURGERY — ARTHROPLASTY, HIP, TOTAL, ANTERIOR APPROACH
Anesthesia: Monitor Anesthesia Care | Site: Hip | Laterality: Right

## 2020-03-24 MED ORDER — DIPHENHYDRAMINE HCL 12.5 MG/5ML PO ELIX
12.5000 mg | ORAL_SOLUTION | ORAL | Status: DC | PRN
  Administered 2020-03-24 – 2020-03-25 (×2): 25 mg via ORAL
  Filled 2020-03-24 (×2): qty 10

## 2020-03-24 MED ORDER — MIDAZOLAM HCL 2 MG/2ML IJ SOLN
INTRAMUSCULAR | Status: DC | PRN
  Administered 2020-03-24: 2 mg via INTRAVENOUS

## 2020-03-24 MED ORDER — ALUM & MAG HYDROXIDE-SIMETH 200-200-20 MG/5ML PO SUSP: 30.0000 mL | ORAL | Status: DC | PRN

## 2020-03-24 MED ORDER — METHOCARBAMOL 1000 MG/10ML IJ SOLN
500.0000 mg | Freq: Four times a day (QID) | INTRAVENOUS | Status: DC | PRN
  Filled 2020-03-24: qty 5

## 2020-03-24 MED ORDER — STERILE WATER FOR IRRIGATION IR SOLN
Status: DC | PRN
  Administered 2020-03-24: 2000 mL

## 2020-03-24 MED ORDER — HYDROCODONE-ACETAMINOPHEN 5-325 MG PO TABS: 1.0000 | ORAL_TABLET | ORAL | Status: DC | PRN

## 2020-03-24 MED ORDER — ACETAMINOPHEN 500 MG PO TABS
500.0000 mg | ORAL_TABLET | Freq: Four times a day (QID) | ORAL | Status: DC
  Administered 2020-03-24 – 2020-03-25 (×3): 500 mg via ORAL
  Filled 2020-03-24 (×3): qty 1

## 2020-03-24 MED ORDER — TRANEXAMIC ACID 1000 MG/10ML IV SOLN
INTRAVENOUS | Status: DC | PRN
  Administered 2020-03-24: 2000 mg via TOPICAL

## 2020-03-24 MED ORDER — LACTATED RINGERS IV SOLN: INTRAVENOUS | Status: DC

## 2020-03-24 MED ORDER — HYDROCODONE-ACETAMINOPHEN 7.5-325 MG PO TABS
1.0000 | ORAL_TABLET | ORAL | Status: DC | PRN
  Administered 2020-03-24 – 2020-03-25 (×5): 2 via ORAL
  Filled 2020-03-24 (×5): qty 2

## 2020-03-24 MED ORDER — BUPIVACAINE HCL (PF) 0.25 % IJ SOLN
INTRAMUSCULAR | Status: AC
  Filled 2020-03-24: qty 30

## 2020-03-24 MED ORDER — PHENYLEPHRINE 40 MCG/ML (10ML) SYRINGE FOR IV PUSH (FOR BLOOD PRESSURE SUPPORT)
PREFILLED_SYRINGE | INTRAVENOUS | Status: DC | PRN
  Administered 2020-03-24: 120 ug via INTRAVENOUS
  Administered 2020-03-24: 80 ug via INTRAVENOUS

## 2020-03-24 MED ORDER — FENTANYL CITRATE (PF) 100 MCG/2ML IJ SOLN
INTRAMUSCULAR | Status: AC
  Filled 2020-03-24: qty 2

## 2020-03-24 MED ORDER — ACETAMINOPHEN 325 MG PO TABS: 325.0000 mg | ORAL_TABLET | Freq: Four times a day (QID) | ORAL | Status: DC | PRN

## 2020-03-24 MED ORDER — ONDANSETRON HCL 4 MG/2ML IJ SOLN
4.0000 mg | Freq: Four times a day (QID) | INTRAMUSCULAR | Status: DC | PRN
  Administered 2020-03-24: 4 mg via INTRAVENOUS
  Filled 2020-03-24: qty 2

## 2020-03-24 MED ORDER — PROPOFOL 500 MG/50ML IV EMUL
INTRAVENOUS | Status: DC | PRN
  Administered 2020-03-24: 80 ug/kg/min via INTRAVENOUS

## 2020-03-24 MED ORDER — MORPHINE SULFATE (PF) 2 MG/ML IV SOLN
0.5000 mg | INTRAVENOUS | Status: DC | PRN
  Administered 2020-03-24: 1 mg via INTRAVENOUS
  Filled 2020-03-24: qty 1

## 2020-03-24 MED ORDER — CHLORHEXIDINE GLUCONATE 0.12 % MT SOLN
15.0000 mL | Freq: Once | OROMUCOSAL | Status: AC
  Administered 2020-03-24: 15 mL via OROMUCOSAL

## 2020-03-24 MED ORDER — TRANEXAMIC ACID-NACL 1000-0.7 MG/100ML-% IV SOLN
1000.0000 mg | Freq: Once | INTRAVENOUS | Status: AC
  Administered 2020-03-24: 1000 mg via INTRAVENOUS
  Filled 2020-03-24: qty 100

## 2020-03-24 MED ORDER — KETOROLAC TROMETHAMINE 15 MG/ML IJ SOLN
15.0000 mg | Freq: Four times a day (QID) | INTRAMUSCULAR | Status: DC
  Administered 2020-03-24 – 2020-03-25 (×3): 15 mg via INTRAVENOUS
  Filled 2020-03-24 (×3): qty 1

## 2020-03-24 MED ORDER — MENTHOL 3 MG MT LOZG: 1.0000 | LOZENGE | OROMUCOSAL | Status: DC | PRN

## 2020-03-24 MED ORDER — ONDANSETRON HCL 4 MG PO TABS: 4.0000 mg | ORAL_TABLET | Freq: Four times a day (QID) | ORAL | Status: DC | PRN

## 2020-03-24 MED ORDER — 0.9 % SODIUM CHLORIDE (POUR BTL) OPTIME
TOPICAL | Status: DC | PRN
  Administered 2020-03-24: 1000 mL

## 2020-03-24 MED ORDER — DEXAMETHASONE SODIUM PHOSPHATE 10 MG/ML IJ SOLN
INTRAMUSCULAR | Status: DC | PRN
  Administered 2020-03-24: 4 mg via INTRAVENOUS

## 2020-03-24 MED ORDER — LIDOCAINE HCL (PF) 2 % IJ SOLN
INTRAMUSCULAR | Status: AC
  Filled 2020-03-24: qty 5

## 2020-03-24 MED ORDER — LIDOCAINE HCL (CARDIAC) PF 100 MG/5ML IV SOSY
PREFILLED_SYRINGE | INTRAVENOUS | Status: DC | PRN
  Administered 2020-03-24: 60 mg via INTRAVENOUS

## 2020-03-24 MED ORDER — TRANEXAMIC ACID-NACL 1000-0.7 MG/100ML-% IV SOLN
1000.0000 mg | INTRAVENOUS | Status: AC
  Administered 2020-03-24: 1000 mg via INTRAVENOUS
  Filled 2020-03-24: qty 100

## 2020-03-24 MED ORDER — PHENYLEPHRINE HCL-NACL 10-0.9 MG/250ML-% IV SOLN
INTRAVENOUS | Status: DC | PRN
  Administered 2020-03-24: 40 ug/min via INTRAVENOUS

## 2020-03-24 MED ORDER — PROPOFOL 500 MG/50ML IV EMUL
INTRAVENOUS | Status: AC
  Filled 2020-03-24: qty 50

## 2020-03-24 MED ORDER — POVIDONE-IODINE 10 % EX SWAB
2.0000 "application " | Freq: Once | CUTANEOUS | Status: AC
  Administered 2020-03-24: 2 via TOPICAL

## 2020-03-24 MED ORDER — MIDAZOLAM HCL 2 MG/2ML IJ SOLN
INTRAMUSCULAR | Status: AC
  Filled 2020-03-24: qty 2

## 2020-03-24 MED ORDER — METOCLOPRAMIDE HCL 5 MG/ML IJ SOLN: 5.0000 mg | Freq: Three times a day (TID) | INTRAMUSCULAR | Status: DC | PRN

## 2020-03-24 MED ORDER — PROPOFOL 10 MG/ML IV BOLUS
INTRAVENOUS | Status: AC
  Filled 2020-03-24: qty 20

## 2020-03-24 MED ORDER — BISACODYL 5 MG PO TBEC: 5.0000 mg | DELAYED_RELEASE_TABLET | Freq: Every day | ORAL | Status: DC | PRN

## 2020-03-24 MED ORDER — METHOCARBAMOL 500 MG PO TABS
500.0000 mg | ORAL_TABLET | Freq: Four times a day (QID) | ORAL | Status: DC | PRN
  Administered 2020-03-24 – 2020-03-25 (×3): 500 mg via ORAL
  Filled 2020-03-24 (×3): qty 1

## 2020-03-24 MED ORDER — BUPIVACAINE LIPOSOME 1.3 % IJ SUSP
INTRAMUSCULAR | Status: DC | PRN
  Administered 2020-03-24: 10 mL

## 2020-03-24 MED ORDER — ATORVASTATIN CALCIUM 20 MG PO TABS
20.0000 mg | ORAL_TABLET | Freq: Every day | ORAL | Status: DC
  Administered 2020-03-24 – 2020-03-25 (×2): 20 mg via ORAL
  Filled 2020-03-24 (×2): qty 1

## 2020-03-24 MED ORDER — FENTANYL CITRATE (PF) 100 MCG/2ML IJ SOLN
INTRAMUSCULAR | Status: DC | PRN
Start: 2020-03-24 — End: 2020-03-24
  Administered 2020-03-24 (×2): 25 ug via INTRAVENOUS
  Administered 2020-03-24: 50 ug via INTRAVENOUS

## 2020-03-24 MED ORDER — BUPIVACAINE IN DEXTROSE 0.75-8.25 % IT SOLN
INTRATHECAL | Status: DC | PRN
  Administered 2020-03-24: 1.8 mL via INTRATHECAL

## 2020-03-24 MED ORDER — BUPIVACAINE HCL 0.25 % IJ SOLN
INTRAMUSCULAR | Status: DC | PRN
  Administered 2020-03-24: 30 mL

## 2020-03-24 MED ORDER — PHENOL 1.4 % MT LIQD: 1.0000 | OROMUCOSAL | Status: DC | PRN

## 2020-03-24 MED ORDER — AMISULPRIDE (ANTIEMETIC) 5 MG/2ML IV SOLN: 10.0000 mg | Freq: Once | INTRAVENOUS | Status: DC | PRN

## 2020-03-24 MED ORDER — PROPOFOL 10 MG/ML IV BOLUS
INTRAVENOUS | Status: DC | PRN
  Administered 2020-03-24 (×6): 20 mg via INTRAVENOUS

## 2020-03-24 MED ORDER — PHENYLEPHRINE 40 MCG/ML (10ML) SYRINGE FOR IV PUSH (FOR BLOOD PRESSURE SUPPORT)
PREFILLED_SYRINGE | INTRAVENOUS | Status: AC
  Filled 2020-03-24: qty 10

## 2020-03-24 MED ORDER — ORAL CARE MOUTH RINSE: 15.0000 mL | Freq: Once | OROMUCOSAL | Status: AC

## 2020-03-24 MED ORDER — ASPIRIN 81 MG PO CHEW
81.0000 mg | CHEWABLE_TABLET | Freq: Two times a day (BID) | ORAL | Status: DC
  Administered 2020-03-25: 81 mg via ORAL
  Filled 2020-03-24: qty 1

## 2020-03-24 MED ORDER — VANCOMYCIN HCL IN DEXTROSE 1-5 GM/200ML-% IV SOLN
1000.0000 mg | Freq: Two times a day (BID) | INTRAVENOUS | Status: AC
  Administered 2020-03-25: 1000 mg via INTRAVENOUS
  Filled 2020-03-24: qty 200

## 2020-03-24 MED ORDER — LIRAGLUTIDE -WEIGHT MANAGEMENT 18 MG/3ML ~~LOC~~ SOPN: 3.0000 mg | PEN_INJECTOR | Freq: Every day | SUBCUTANEOUS | Status: DC

## 2020-03-24 MED ORDER — METOCLOPRAMIDE HCL 5 MG PO TABS: 5.0000 mg | ORAL_TABLET | Freq: Three times a day (TID) | ORAL | Status: DC | PRN

## 2020-03-24 MED ORDER — DOCUSATE SODIUM 100 MG PO CAPS
100.0000 mg | ORAL_CAPSULE | Freq: Two times a day (BID) | ORAL | Status: DC
  Administered 2020-03-24 – 2020-03-25 (×2): 100 mg via ORAL
  Filled 2020-03-24 (×3): qty 1

## 2020-03-24 MED ORDER — FENTANYL CITRATE (PF) 100 MCG/2ML IJ SOLN
25.0000 ug | INTRAMUSCULAR | Status: DC | PRN
  Administered 2020-03-24: 50 ug via INTRAVENOUS

## 2020-03-24 SURGICAL SUPPLY — 47 items
BAG DECANTER FOR FLEXI CONT (MISCELLANEOUS) ×3 IMPLANT
BLADE SAW SGTL 18X1.27X75 (BLADE) ×2 IMPLANT
BLADE SAW SGTL 18X1.27X75MM (BLADE) ×1
BOOTIES KNEE HIGH SLOAN (MISCELLANEOUS) ×3 IMPLANT
CELLS DAT CNTRL 66122 CELL SVR (MISCELLANEOUS) ×1 IMPLANT
CLOSURE STERI-STRIP 1/2X4 (GAUZE/BANDAGES/DRESSINGS) ×1
CLSR STERI-STRIP ANTIMIC 1/2X4 (GAUZE/BANDAGES/DRESSINGS) ×2 IMPLANT
COVER PERINEAL POST (MISCELLANEOUS) ×3 IMPLANT
COVER SURGICAL LIGHT HANDLE (MISCELLANEOUS) ×3 IMPLANT
COVER WAND RF STERILE (DRAPES) IMPLANT
CUP GRIPTON 48MM 100 HIP (Hips) ×2 IMPLANT
DECANTER SPIKE VIAL GLASS SM (MISCELLANEOUS) ×3 IMPLANT
DRAPE IMP U-DRAPE 54X76 (DRAPES) ×3 IMPLANT
DRAPE ORTHO SPLIT 77X108 STRL (DRAPES)
DRAPE STERI IOBAN 125X83 (DRAPES) ×3 IMPLANT
DRAPE SURG ORHT 6 SPLT 77X108 (DRAPES) IMPLANT
DRAPE U-SHAPE 47X51 STRL (DRAPES) ×6 IMPLANT
DRSG AQUACEL AG ADV 3.5X 6 (GAUZE/BANDAGES/DRESSINGS) ×3 IMPLANT
DURAPREP 26ML APPLICATOR (WOUND CARE) ×3 IMPLANT
ELECT BLADE TIP CTD 4 INCH (ELECTRODE) ×3 IMPLANT
ELECT REM PT RETURN 15FT ADLT (MISCELLANEOUS) ×3 IMPLANT
ELIMINATOR HOLE APEX DEPUY (Hips) ×2 IMPLANT
GLOVE BIO SURGEON STRL SZ8 (GLOVE) ×6 IMPLANT
GLOVE BIOGEL PI IND STRL 8 (GLOVE) ×2 IMPLANT
GLOVE BIOGEL PI INDICATOR 8 (GLOVE) ×4
GOWN STRL REUS W/TWL XL LVL3 (GOWN DISPOSABLE) ×6 IMPLANT
HEAD FEMORAL 32 CERAMIC (Hips) ×3 IMPLANT
HOLDER FOLEY CATH W/STRAP (MISCELLANEOUS) ×3 IMPLANT
KIT TURNOVER KIT A (KITS) IMPLANT
LINER ACET 32X48 (Liner) ×2 IMPLANT
MANIFOLD NEPTUNE II (INSTRUMENTS) ×3 IMPLANT
NEEDLE HYPO 22GX1.5 SAFETY (NEEDLE) ×3 IMPLANT
NS IRRIG 1000ML POUR BTL (IV SOLUTION) ×3 IMPLANT
PACK ANTERIOR HIP CUSTOM (KITS) ×3 IMPLANT
PENCIL SMOKE EVACUATOR (MISCELLANEOUS) IMPLANT
PROTECTOR NERVE ULNAR (MISCELLANEOUS) ×3 IMPLANT
RETRACTOR WND ALEXIS 18 MED (MISCELLANEOUS) ×1 IMPLANT
RTRCTR WOUND ALEXIS 18CM MED (MISCELLANEOUS) ×3
STEM FEM ACTIS HIGH SZ2 (Stem) ×2 IMPLANT
SUT ETHIBOND NAB CT1 #1 30IN (SUTURE) ×6 IMPLANT
SUT VIC AB 1 CT1 36 (SUTURE) ×3 IMPLANT
SUT VIC AB 2-0 CT1 27 (SUTURE) ×3
SUT VIC AB 2-0 CT1 TAPERPNT 27 (SUTURE) ×1 IMPLANT
SUT VICRYL AB 3-0 FS1 BRD 27IN (SUTURE) ×3 IMPLANT
SUT VLOC 180 0 24IN GS25 (SUTURE) ×3 IMPLANT
SYR 50ML LL SCALE MARK (SYRINGE) ×3 IMPLANT
TRAY FOLEY MTR SLVR 16FR STAT (SET/KITS/TRAYS/PACK) ×3 IMPLANT

## 2020-03-24 NOTE — Transfer of Care (Signed)
Immediate Anesthesia Transfer of Care Note  Patient: Sandra Bird  Procedure(s) Performed: Procedure(s): RIGHT TOTAL HIP ARTHROPLASTY ANTERIOR APPROACH (Right)  Patient Location: PACU  Anesthesia Type:Spinal  Level of Consciousness: awake, alert  and oriented  Airway & Oxygen Therapy: Patient Spontanous Breathing  Post-op Assessment: Report given to RN and Post -op Vital signs reviewed and stable  Post vital signs: Reviewed and stable  Last Vitals:  Vitals:   03/24/20 1354  BP: (!) 153/85  Pulse: 84  Resp: 18  Temp: 36.8 C  SpO2: 100%    Complications: No apparent anesthesia complications

## 2020-03-24 NOTE — Anesthesia Procedure Notes (Signed)
Spinal  Patient location during procedure: OR Start time: 03/24/2020 4:57 PM End time: 03/24/2020 5:07 PM Staffing Performed: anesthesiologist  Anesthesiologist: Marcene Duos, MD Preanesthetic Checklist Completed: patient identified, IV checked, site marked, risks and benefits discussed, surgical consent, monitors and equipment checked, pre-op evaluation and timeout performed Spinal Block Patient position: sitting Prep: DuraPrep Patient monitoring: heart rate, cardiac monitor, continuous pulse ox and blood pressure Approach: right paramedian Location: L3-4 Injection technique: single-shot Needle Needle type: Quincke  Needle gauge: 22 G Needle length: 9 cm Assessment Sensory level: T4 Additional Notes Multiple unsuccessful attempts via midline approach using 24G Pencan. Free flowing CSF obtained prior to injection via right paramedian approach with 22G Quincke.

## 2020-03-24 NOTE — Anesthesia Preprocedure Evaluation (Signed)
Anesthesia Evaluation  Patient identified by MRN, date of birth, ID band Patient awake    Reviewed: Allergy & Precautions, NPO status , Patient's Chart, lab work & pertinent test results  Airway Mallampati: III  TM Distance: >3 FB Neck ROM: Full    Dental  (+) Dental Advisory Given   Pulmonary sleep apnea ,    breath sounds clear to auscultation       Cardiovascular negative cardio ROS   Rhythm:Regular Rate:Normal     Neuro/Psych negative neurological ROS     GI/Hepatic negative GI ROS, Neg liver ROS,   Endo/Other  negative endocrine ROS  Renal/GU negative Renal ROS     Musculoskeletal  (+) Arthritis ,   Abdominal   Peds  Hematology negative hematology ROS (+)   Anesthesia Other Findings   Reproductive/Obstetrics                             Anesthesia Physical Anesthesia Plan  ASA: III  Anesthesia Plan: Spinal and MAC   Post-op Pain Management:    Induction:   PONV Risk Score and Plan: 2 and Propofol infusion, Ondansetron and Treatment may vary due to age or medical condition  Airway Management Planned: Natural Airway and Simple Face Mask  Additional Equipment: None  Intra-op Plan:   Post-operative Plan:   Informed Consent: I have reviewed the patients History and Physical, chart, labs and discussed the procedure including the risks, benefits and alternatives for the proposed anesthesia with the patient or authorized representative who has indicated his/her understanding and acceptance.       Plan Discussed with: CRNA  Anesthesia Plan Comments:         Anesthesia Quick Evaluation

## 2020-03-24 NOTE — Op Note (Signed)

## 2020-03-24 NOTE — Interval H&P Note (Signed)
History and Physical Interval Note:  03/24/2020 4:20 PM  Sandra Bird  has presented today for surgery, with the diagnosis of PRIMARY OSTEOARTHRITIS.  The various methods of treatment have been discussed with the patient and family. After consideration of risks, benefits and other options for treatment, the patient has consented to  Procedure(s): RIGHT TOTAL HIP ARTHROPLASTY ANTERIOR APPROACH (Right) as a surgical intervention.  The patient's history has been reviewed, patient examined, no change in status, stable for surgery.  I have reviewed the patient's chart and labs.  Questions were answered to the patient's satisfaction.     Velna Ochs

## 2020-03-24 NOTE — Anesthesia Postprocedure Evaluation (Signed)
Anesthesia Post Note  Patient: Sandra Bird Vi  Procedure(s) Performed: RIGHT TOTAL HIP ARTHROPLASTY ANTERIOR APPROACH (Right Hip)     Patient location during evaluation: PACU Anesthesia Type: Spinal Level of consciousness: awake and alert Pain management: pain level controlled Vital Signs Assessment: post-procedure vital signs reviewed and stable Respiratory status: spontaneous breathing and respiratory function stable Cardiovascular status: blood pressure returned to baseline and stable Postop Assessment: spinal receding Anesthetic complications: no   No complications documented.  Last Vitals:  Vitals:   03/24/20 2015 03/24/20 2030  BP: 130/66 (!) 130/57  Pulse: 62 66  Resp: 12 15  Temp: 36.5 C   SpO2: 96% 96%    Last Pain:  Vitals:   03/24/20 2030  TempSrc:   PainSc: Asleep    LLE Motor Response: Purposeful movement;Responds to commands (03/24/20 2030) LLE Sensation: Decreased (03/24/20 2030) RLE Motor Response: Purposeful movement;Responds to commands (03/24/20 2030) RLE Sensation: Decreased (03/24/20 2030) L Sensory Level: S1-Sole of foot, small toes (03/24/20 2030) R Sensory Level: S1-Sole of foot, small toes (03/24/20 2030)  Kennieth Rad

## 2020-03-25 ENCOUNTER — Other Ambulatory Visit (HOSPITAL_COMMUNITY): Payer: Self-pay | Admitting: Orthopedic Surgery

## 2020-03-25 ENCOUNTER — Encounter (HOSPITAL_COMMUNITY): Payer: Self-pay | Admitting: Orthopaedic Surgery

## 2020-03-25 DIAGNOSIS — M1611 Unilateral primary osteoarthritis, right hip: Secondary | ICD-10-CM | POA: Diagnosis not present

## 2020-03-25 MED ORDER — ASPIRIN EC 81 MG PO TBEC: 81.0000 mg | DELAYED_RELEASE_TABLET | Freq: Two times a day (BID) | ORAL | 0 refills | Status: DC

## 2020-03-25 MED ORDER — HYDROCODONE-ACETAMINOPHEN 5-325 MG PO TABS
1.0000 | ORAL_TABLET | Freq: Four times a day (QID) | ORAL | 0 refills | Status: DC | PRN
Start: 2020-03-25 — End: 2020-03-25

## 2020-03-25 MED ORDER — TIZANIDINE HCL 4 MG PO TABS: 4.0000 mg | ORAL_TABLET | Freq: Four times a day (QID) | ORAL | 1 refills | Status: DC | PRN

## 2020-03-25 MED FILL — tiZANidine HCL 4 MG TABS: 4 | 10 days supply | Qty: 40 | Fill #0

## 2020-03-25 MED FILL — HYDROCODON-APAP 5-325: 5-325 | 5 days supply | Qty: 40 | Fill #0

## 2020-03-25 MED FILL — ASPIRIN LOW DOSE 81 MG TBEC: 81 | 30 days supply | Qty: 60 | Fill #0

## 2020-03-25 NOTE — Progress Notes (Signed)
Physical Therapy Treatment Patient Details Name: Sandra Bird MRN: [id number] DOB: 1970/05/19 Today's Date: 03/25/2020    History of Present Illness Pt is a 49 year old female s/p R THA    PT Comments    Pt assisted to bathroom, ambulated in hallway and practiced safe stair technique.  Pt and daughter report understanding of stair technique and provided stair handout.  Pt feels ready for d/c home today.   Follow Up Recommendations  Home health PT;Follow surgeon's recommendation for DC plan and follow-up therapies     Equipment Recommendations  Rolling walker with 5" wheels;3in1 (PT)    Recommendations for Other Services       Precautions / Restrictions Precautions Precautions: Fall Restrictions Weight Bearing Restrictions: No    Mobility  Bed Mobility Overal bed mobility: Needs Assistance Bed Mobility: Supine to Sit     Supine to sit: Min guard;HOB elevated     General bed mobility comments: pt in recliner  Transfers Overall transfer level: Needs assistance Equipment used: Rolling walker (2 wheeled) Transfers: Sit to/from Stand Sit to Stand: Min guard         General transfer comment: increased time and effort; cues for UE and LE positioning  Ambulation/Gait Ambulation/Gait assistance: Min guard Gait Distance (Feet): 120 Feet Assistive device: Rolling walker (2 wheeled) Gait Pattern/deviations: Step-to pattern;Decreased stance time - right;Antalgic     General Gait Details: verbal cues for sequence, RW positioning, step length, allowing knee flexion, posture;  very slow pace   Stairs Stairs: Yes Stairs assistance: Min guard Stair Management: Step to pattern;Backwards;With walker Number of Stairs: 2 General stair comments: verbal cues for sequence and RW positioning; daughter present and held RW; provided handout   Wheelchair Mobility    Modified Rankin (Stroke Patients Only)       Balance                                             Cognition Arousal/Alertness: Awake/alert Behavior During Therapy: WFL for tasks assessed/performed Overall Cognitive Status: Within Functional Limits for tasks assessed                                        Exercises      General Comments        Pertinent Vitals/Pain Pain Assessment: 0-10 Pain Score: 6  Pain Location: right hip Pain Descriptors / Indicators: Aching;Sore Pain Intervention(s): Repositioned;Monitored during session    Home Living Family/patient expects to be discharged to:: Private residence Living Arrangements: Children   Type of Home: House Home Access: Stairs to enter Entrance Stairs-Rails: Right Home Layout: Two level Home Equipment: None      Prior Function Level of Independence: Independent          PT Goals (current goals can now be found in the care plan section) Acute Rehab PT Goals PT Goal Formulation: With patient Time For Goal Achievement: 04/08/20 Potential to Achieve Goals: Good Progress towards PT goals: Progressing toward goals    Frequency    7X/week      PT Plan Current plan remains appropriate    Co-evaluation              AM-PAC PT "6 Clicks" Mobility   Outcome Measure  Help needed turning from your back  to your side while in a flat bed without using bedrails?: A Little Help needed moving from lying on your back to sitting on the side of a flat bed without using bedrails?: A Little Help needed moving to and from a bed to a chair (including a wheelchair)?: A Little Help needed standing up from a chair using your arms (e.g., wheelchair or bedside chair)?: A Little Help needed to walk in hospital room?: A Little Help needed climbing 3-5 steps with a railing? : A Little 6 Click Score: 18    End of Session Equipment Utilized During Treatment: Gait belt Activity Tolerance: Patient tolerated treatment well Patient left: in chair;with call bell/phone within reach Nurse  Communication: Mobility status PT Visit Diagnosis: Other abnormalities of gait and mobility (R26.89)     Time: 7829-5621 PT Time Calculation (min) (ACUTE ONLY): 25 min  Charges:  $Gait Training: 23-37 mins                    Thomasene Mohair PT, DPT Acute Rehabilitation Services Pager: [phone] Office: [phone]  Sarajane Jews 03/25/2020, 3:48 PM

## 2020-03-25 NOTE — Progress Notes (Signed)
Subjective: 1 Day Post-Op Procedure(s) (LRB): RIGHT TOTAL HIP ARTHROPLASTY ANTERIOR APPROACH (Right) Patient had a rough night but is feeling much better now. She is hoping to go home today.  Activity level:  wbat Diet tolerance:  ok Voiding:  ok Patient reports pain as mild.    Objective: Vital signs in last 24 hours: Temp:  [97.5 F (36.4 C)-98.4 F (36.9 C)] 98.4 F (36.9 C) (12/01 0521) Pulse Rate:  [62-86] 86 (12/01 0521) Resp:  [11-18] 18 (12/01 0521) BP: (112-153)/(49-97) 120/57 (12/01 0521) SpO2:  [96 %-100 %] 99 % (12/01 0521) Weight:  [114.8 kg-121.4 kg] 121.4 kg (11/30 2058)  Labs: No results for input(s): HGB in the last 72 hours. No results for input(s): WBC, RBC, HCT, PLT in the last 72 hours. No results for input(s): NA, K, CL, CO2, BUN, CREATININE, GLUCOSE, CALCIUM in the last 72 hours. No results for input(s): LABPT, INR in the last 72 hours.  Physical Exam:  Neurologically intact ABD soft Neurovascular intact Sensation intact distally Intact pulses distally Dorsiflexion/Plantar flexion intact Incision: dressing C/D/I and no drainage No cellulitis present Compartment soft  Assessment/Plan:  1 Day Post-Op Procedure(s) (LRB): RIGHT TOTAL HIP ARTHROPLASTY ANTERIOR APPROACH (Right) Advance diet Up with therapy D/C IV fluids Discharge home with home health today after PT if cleared by PT. Continue on 81mg  asas BID x 4 weeks for dvt prevention. Follow up in office 2 weeks post op.  Sandra Bird 03/25/2020, 8:01 AM

## 2020-03-25 NOTE — TOC Transition Note (Signed)
Transition of Care [facility]) - CM/SW Discharge Note   Patient Details  Name: Sandra Bird MRN: [id number] Date of Birth: 10-13-1970  Transition of Care [facility]) CM/SW Contact:  Clearance Coots, LCSW Phone Number:  03/25/2020, 3:49 PM   Clinical Narrative:    Workerscomp. CM notiied the patient and CSW the DME may not be delivered to the patient home until Friday.  Patient reports she has a RW to borrow until then however will need a 3 in1. Workercomp. CM notified CSW and pt. If she purchases a 3 IN1 from vendor today they will reimburse her. Patient understands she will need to send the bill to the CM.  CSW ordered 3 in 1 through [employer], 3 IN1 delivered to the pt. Bedside.  Workerscomp CM to call with home health details.   No other needs identified.    Final next level of care: Home w Home Health Services Barriers to Discharge: Barriers Resolved   Patient Goals and CMS Choice        Discharge Placement                       Discharge Plan and Services                  DME Agency: [employer] Date DME Agency Contacted: 03/25/20 Time DME Agency Contacted: [id number] Representative spoke with at DME Agency: Velna Hatchet            Social Determinants of Health (SDOH) Interventions     Readmission Risk Interventions No flowsheet data found.

## 2020-03-25 NOTE — Evaluation (Signed)
Physical Therapy Evaluation Patient Details Name: Sandra Bird MRN: [id number] DOB: November 09, 1970 Today's Date: 03/25/2020   History of Present Illness  Pt is a 49 year old female s/p R THA  Clinical Impression  Pt is s/p THA resulting in the deficits listed below (see PT Problem List).  Pt will benefit from skilled PT to increase their independence and safety with mobility to allow discharge to the venue listed below.  Pt assisted with ambulating and requiring increased time for mobility. Pt has steps to enter home and stairs in home so will need to practice steps prior to d/c.      Follow Up Recommendations Home health PT;Follow surgeon's recommendation for DC plan and follow-up therapies    Equipment Recommendations  Rolling walker with 5" wheels;3in1 (PT)    Recommendations for Other Services       Precautions / Restrictions Precautions Precautions: Fall Restrictions Weight Bearing Restrictions: No      Mobility  Bed Mobility Overal bed mobility: Needs Assistance Bed Mobility: Supine to Sit     Supine to sit: Min guard;HOB elevated     General bed mobility comments: verbal cues for self assist of R LE    Transfers Overall transfer level: Needs assistance Equipment used: Rolling walker (2 wheeled) Transfers: Sit to/from Stand Sit to Stand: Min guard         General transfer comment: increased time and effort; cues for UE and LE positioning  Ambulation/Gait Ambulation/Gait assistance: Min guard Gait Distance (Feet): 100 Feet Assistive device: Rolling walker (2 wheeled) Gait Pattern/deviations: Step-to pattern;Decreased stance time - right;Antalgic     General Gait Details: verbal cues for sequence, RW positioning, step length, very slow pace; pt reports mild dizziness which did not become worse  Stairs            Wheelchair Mobility    Modified Rankin (Stroke Patients Only)       Balance                                              Pertinent Vitals/Pain Pain Assessment: 0-10 Pain Score: 5  Pain Location: right hip Pain Descriptors / Indicators: Aching;Sore Pain Intervention(s): Repositioned;Monitored during session    Home Living Family/patient expects to be discharged to:: Private residence Living Arrangements: Children   Type of Home: House Home Access: Stairs to enter Entrance Stairs-Rails: Right Entrance Stairs-Number of Steps: 7 Home Layout: Two level Home Equipment: None      Prior Function Level of Independence: Independent               Hand Dominance        Extremity/Trunk Assessment        Lower Extremity Assessment Lower Extremity Assessment: RLE deficits/detail RLE Deficits / Details: anticipated right hip post op weakness, pt required assist for LE for bed mobility       Communication   Communication: No difficulties  Cognition Arousal/Alertness: Awake/alert Behavior During Therapy: WFL for tasks assessed/performed Overall Cognitive Status: Within Functional Limits for tasks assessed                                        General Comments      Exercises     Assessment/Plan    PT Assessment Patient  needs continued PT services  PT Problem List Decreased knowledge of use of DME;Decreased range of motion;Decreased strength;Decreased mobility;Pain;Decreased activity tolerance       PT Treatment Interventions Functional mobility training;Stair training;Gait training;DME instruction;Therapeutic exercise;Therapeutic activities;Patient/family education    PT Goals (Current goals can be found in the Care Plan section)  Acute Rehab PT Goals PT Goal Formulation: With patient Time For Goal Achievement: 04/08/20 Potential to Achieve Goals: Good    Frequency 7X/week   Barriers to discharge        Co-evaluation               AM-PAC PT "6 Clicks" Mobility  Outcome Measure Help needed turning from your back to your side while in a  flat bed without using bedrails?: A Little Help needed moving from lying on your back to sitting on the side of a flat bed without using bedrails?: A Little Help needed moving to and from a bed to a chair (including a wheelchair)?: A Little Help needed standing up from a chair using your arms (e.g., wheelchair or bedside chair)?: A Little Help needed to walk in hospital room?: A Little Help needed climbing 3-5 steps with a railing? : A Lot 6 Click Score: 17    End of Session Equipment Utilized During Treatment: Gait belt Activity Tolerance: Patient tolerated treatment well Patient left: in chair;with call bell/phone within reach;with chair alarm set Nurse Communication: Mobility status PT Visit Diagnosis: Other abnormalities of gait and mobility (R26.89)    Time: 7106-2694 PT Time Calculation (min) (ACUTE ONLY): 25 min   Charges:   PT Evaluation $PT Eval Low Complexity: 1 Low PT Treatments $Gait Training: 8-22 mins       Paulino Door, DPT Acute Rehabilitation Services Pager: [phone] Office: [phone] Maida Sale E 03/25/2020, 1:06 PM

## 2020-03-25 NOTE — Discharge Summary (Signed)
Patient ID: CARRESSA KIESOW MRN: [id number] DOB/AGE: 51/11/1970 51 y.o.  Admit date: 03/24/2020 Discharge date: 03/25/2020  Admission Diagnoses:  Principal Problem:   Primary localized osteoarthritis of right hip Active Problems:   Primary osteoarthritis of right hip   Discharge Diagnoses:  Same  Past Medical History:  Diagnosis Date  . Arthritis   . SVD (spontaneous vaginal delivery)    x 3    Surgeries: Procedure(s): RIGHT TOTAL HIP ARTHROPLASTY ANTERIOR APPROACH on 03/24/2020   Consultants:   Discharged Condition: Improved  Hospital Course: Randeep Colglazier Service is an 49 y.o. female who was admitted 03/24/2020 for operative treatment ofPrimary localized osteoarthritis of right hip. Patient has severe unremitting pain that affects sleep, daily activities, and work/hobbies. After pre-op clearance the patient was taken to the operating room on 03/24/2020 and underwent  Procedure(s): RIGHT TOTAL HIP ARTHROPLASTY ANTERIOR APPROACH.    Patient was given perioperative antibiotics:  Anti-infectives (From admission, onward)   Start     Dose/Rate Route Frequency Ordered Stop   03/25/20 0500  vancomycin (VANCOCIN) IVPB 1000 mg/200 mL premix        1,000 mg 200 mL/hr over 60 Minutes Intravenous Every 12 hours 03/24/20 2054 03/25/20 0632   03/24/20 0600  vancomycin (VANCOREADY) IVPB 1500 mg/300 mL        1,500 mg 150 mL/hr over 120 Minutes Intravenous On call to O.R. 03/23/20 0754 03/24/20 1857       Patient was given sequential compression devices, early ambulation, and chemoprophylaxis to prevent DVT.  Patient benefited maximally from hospital stay and there were no complications.    Recent vital signs:  Patient Vitals for the past 24 hrs:  BP Temp Temp src Pulse Resp SpO2 Height Weight  03/25/20 0521 (!) 120/57 98.4 F (36.9 C) -- 86 18 99 % -- --  03/25/20 0008 140/68 97.9 F (36.6 C) -- 72 16 100 % -- --  03/24/20 2305 (!) 147/72 97.6 F (36.4 C) -- 75 18 100 % -- --   03/24/20 2158 (!) 143/97 97.8 F (36.6 C) Oral 71 16 100 % -- --  03/24/20 2058 -- -- -- -- -- -- 5\' 4"  (1.626 m) 121.4 kg  03/24/20 2050 137/70 97.9 F (36.6 C) -- 69 18 100 % -- --  03/24/20 2030 (!) 130/57 -- -- 66 15 96 % -- --  03/24/20 2015 130/66 97.7 F (36.5 C) -- 62 12 96 % -- --  03/24/20 2000 134/61 -- -- 62 11 97 % -- --  03/24/20 1945 135/65 -- -- 67 17 100 % -- --  03/24/20 1930 (!) 128/95 -- -- 68 12 100 % -- --  03/24/20 1915 (!) 112/49 (!) 97.5 F (36.4 C) -- -- 15 100 % -- --  03/24/20 1357 -- -- -- -- -- -- 5\' 3"  (1.6 m) 114.8 kg  03/24/20 1354 (!) 153/85 98.3 F (36.8 C) Oral 84 18 100 % -- --     Recent laboratory studies: No results for input(s): WBC, HGB, HCT, PLT, NA, K, CL, CO2, BUN, CREATININE, GLUCOSE, INR, CALCIUM in the last 72 hours.  Invalid input(s): PT, 2   Discharge Medications:   Allergies as of 03/25/2020      Reactions   Penicillins Rash   Has patient had a PCN reaction causing immediate rash, facial/tongue/throat swelling, SOB or lightheadedness with hypotension: Yes Has patient had a PCN reaction causing severe rash involving mucus membranes or skin necrosis: No Has patient had a PCN reaction that  required hospitalization No Has patient had a PCN reaction occurring within the last 10 years: No If all of the above answers are "NO", then may proceed with Cephalosporin use.      Medication List    STOP taking these medications   naproxen sodium 220 MG tablet Commonly known as: ALEVE     TAKE these medications   aspirin EC 81 MG tablet Take 1 tablet (81 mg total) by mouth 2 (two) times daily after a meal. Swallow whole. What changed:   when to take this  reasons to take this   atorvastatin 20 MG tablet Commonly known as: LIPITOR Take 20 mg by mouth daily.   HYDROcodone-acetaminophen 5-325 MG tablet Commonly known as: NORCO/VICODIN Take 1-2 tablets by mouth every 6 (six) hours as needed for moderate pain or severe pain (post  op pain).   latanoprost 0.005 % ophthalmic solution Commonly known as: XALATAN Place 1 drop into both eyes at bedtime.   multivitamin with minerals Tabs tablet Take 1 tablet by mouth daily.   phentermine 37.5 MG capsule Take 37.5 mg by mouth every morning.   pravastatin 10 MG tablet Commonly known as: PRAVACHOL Take 10 mg by mouth at bedtime.   Saxenda 18 MG/3ML Sopn Generic drug: Liraglutide -Weight Management Inject 3 mg into the skin daily.   tiZANidine 4 MG tablet Commonly known as: Zanaflex Take 1 tablet (4 mg total) by mouth every 6 (six) hours as needed for muscle spasms.   Vitamin D (Ergocalciferol) 1.25 MG (50000 UNIT) Caps capsule Commonly known as: DRISDOL Take 50,000 Units by mouth every 7 (seven) days. Monday            Durable Medical Equipment  (From admission, onward)         Start     Ordered   03/24/20 2055  DME Walker rolling  Once       Question:  Patient needs a walker to treat with the following condition  Answer:  Primary osteoarthritis of right hip   03/24/20 2054   03/24/20 2055  DME 3 n 1  Once        03/24/20 2054   03/24/20 2055  DME Bedside commode  Once       Question:  Patient needs a bedside commode to treat with the following condition  Answer:  Primary osteoarthritis of right hip   03/24/20 2054          Diagnostic Studies: DG Chest 2 View  Result Date: 03/16/2020 CLINICAL DATA:  Preop for hip surgery. EXAM: CHEST - 2 VIEW COMPARISON:  June 08, 2015. FINDINGS: The heart size and mediastinal contours are within normal limits. Both lungs are clear. The visualized skeletal structures are unremarkable. IMPRESSION: No active cardiopulmonary disease. Electronically Signed   By: Lupita Raider M.D.   On: 03/16/2020 16:22   DG C-Arm 1-60 Min-No Report  Result Date: 03/24/2020 Fluoroscopy was utilized by the requesting physician.  No radiographic interpretation.   DG HIP OPERATIVE UNILAT W OR W/O PELVIS RIGHT  Result Date:  03/24/2020 CLINICAL DATA:  Right hip replacement EXAM: OPERATIVE RIGHT HIP WITH PELVIS COMPARISON:  None. FLUOROSCOPY TIME:  Radiation Exposure Index (as provided by the fluoroscopic device): 5.33 mGy If the device does not provide the exposure index: Fluoroscopy Time:  18 seconds Number of Acquired Images:  2 FINDINGS: Right hip prosthesis is noted in satisfactory position. No acute fracture or dislocation is noted IMPRESSION: Status post right hip replacement. Electronically Signed  By: Alcide Clever M.D.   On: 03/24/2020 20:24    Disposition: Discharge disposition: 01-Home or Self Care       Discharge Instructions    Call MD / Call 911   Complete by: As directed    If you experience chest pain or shortness of breath, CALL 911 and be transported to the hospital emergency room.  If you develope a fever above 101 F, pus (white drainage) or increased drainage or redness at the wound, or calf pain, call your surgeon's office.   Constipation Prevention   Complete by: As directed    Drink plenty of fluids.  Prune juice may be helpful.  You may use a stool softener, such as Colace (over the counter) 100 mg twice a day.  Use MiraLax (over the counter) for constipation as needed.   Diet - low sodium heart healthy   Complete by: As directed    Discharge instructions   Complete by: As directed    INSTRUCTIONS AFTER JOINT REPLACEMENT   Remove items at home which could result in a fall. This includes throw rugs or furniture in walking pathways ICE to the affected joint every three hours while awake for 30 minutes at a time, for at least the first 3-5 days, and then as needed for pain and swelling.  Continue to use ice for pain and swelling. You may notice swelling that will progress down to the foot and ankle.  This is normal after surgery.  Elevate your leg when you are not up walking on it.   Continue to use the breathing machine you got in the hospital (incentive spirometer) which will help keep  your temperature down.  It is common for your temperature to cycle up and down following surgery, especially at night when you are not up moving around and exerting yourself.  The breathing machine keeps your lungs expanded and your temperature down.   DIET:  As you were doing prior to hospitalization, we recommend a well-balanced diet.  DRESSING / WOUND CARE / SHOWERING  You may shower 3 days after surgery, but keep the wounds dry during showering.  You may use an occlusive plastic wrap (Press'n Seal for example), NO SOAKING/SUBMERGING IN THE BATHTUB.  If the bandage gets wet, change with a clean dry gauze.  If the incision gets wet, pat the wound dry with a clean towel.  ACTIVITY  Increase activity slowly as tolerated, but follow the weight bearing instructions below.   No driving for 6 weeks or until further direction given by your physician.  You cannot drive while taking narcotics.  No lifting or carrying greater than 10 lbs. until further directed by your surgeon. Avoid periods of inactivity such as sitting longer than an hour when not asleep. This helps prevent blood clots.  You may return to work once you are authorized by your doctor.     WEIGHT BEARING   Weight bearing as tolerated with assist device (walker, cane, etc) as directed, use it as long as suggested by your surgeon or therapist, typically at least 4-6 weeks.   EXERCISES  Results after joint replacement surgery are often greatly improved when you follow the exercise, range of motion and muscle strengthening exercises prescribed by your doctor. Safety measures are also important to protect the joint from further injury. Any time any of these exercises cause you to have increased pain or swelling, decrease what you are doing until you are comfortable again and then slowly increase them. If  you have problems or questions, call your caregiver or physical therapist for advice.   Rehabilitation is important following a joint  replacement. After just a few days of immobilization, the muscles of the leg can become weakened and shrink (atrophy).  These exercises are designed to build up the tone and strength of the thigh and leg muscles and to improve motion. Often times heat used for twenty to thirty minutes before working out will loosen up your tissues and help with improving the range of motion but do not use heat for the first two weeks following surgery (sometimes heat can increase post-operative swelling).   These exercises can be done on a training (exercise) mat, on the floor, on a table or on a bed. Use whatever works the best and is most comfortable for you.    Use music or television while you are exercising so that the exercises are a pleasant break in your day. This will make your life better with the exercises acting as a break in your routine that you can look forward to.   Perform all exercises about fifteen times, three times per day or as directed.  You should exercise both the operative leg and the other leg as well.  Exercises include:   Quad Sets - Tighten up the muscle on the front of the thigh (Quad) and hold for 5-10 seconds.   Straight Leg Raises - With your knee straight (if you were given a brace, keep it on), lift the leg to 60 degrees, hold for 3 seconds, and slowly lower the leg.  Perform this exercise against resistance later as your leg gets stronger.  Leg Slides: Lying on your back, slowly slide your foot toward your buttocks, bending your knee up off the floor (only go as far as is comfortable). Then slowly slide your foot back down until your leg is flat on the floor again.  Angel Wings: Lying on your back spread your legs to the side as far apart as you can without causing discomfort.  Hamstring Strength:  Lying on your back, push your heel against the floor with your leg straight by tightening up the muscles of your buttocks.  Repeat, but this time bend your knee to a comfortable angle, and  push your heel against the floor.  You may put a pillow under the heel to make it more comfortable if necessary.   A rehabilitation program following joint replacement surgery can speed recovery and prevent re-injury in the future due to weakened muscles. Contact your doctor or a physical therapist for more information on knee rehabilitation.    CONSTIPATION  Constipation is defined medically as fewer than three stools per week and severe constipation as less than one stool per week.  Even if you have a regular bowel pattern at home, your normal regimen is likely to be disrupted due to multiple reasons following surgery.  Combination of anesthesia, postoperative narcotics, change in appetite and fluid intake all can affect your bowels.   YOU MUST use at least one of the following options; they are listed in order of increasing strength to get the job done.  They are all available over the counter, and you may need to use some, POSSIBLY even all of these options:    Drink plenty of fluids (prune juice may be helpful) and high fiber foods Colace 100 mg by mouth twice a day  Senokot for constipation as directed and as needed Dulcolax (bisacodyl), take with full glass of water  Miralax (polyethylene glycol) once or twice a day as needed.  If you have tried all these things and are unable to have a bowel movement in the first 3-4 days after surgery call either your surgeon or your primary doctor.    If you experience loose stools or diarrhea, hold the medications until you stool forms back up.  If your symptoms do not get better within 1 week or if they get worse, check with your doctor.  If you experience "the worst abdominal pain ever" or develop nausea or vomiting, please contact the office immediately for further recommendations for treatment.   ITCHING:  If you experience itching with your medications, try taking only a single pain pill, or even half a pain pill at a time.  You can also use  Benadryl over the counter for itching or also to help with sleep.   TED HOSE STOCKINGS:  Use stockings on both legs until for at least 2 weeks or as directed by physician office. They may be removed at night for sleeping.  MEDICATIONS:  See your medication summary on the "After Visit Summary" that nursing will review with you.  You may have some home medications which will be placed on hold until you complete the course of blood thinner medication.  It is important for you to complete the blood thinner medication as prescribed.  PRECAUTIONS:  If you experience chest pain or shortness of breath - call 911 immediately for transfer to the hospital emergency department.   If you develop a fever greater that 101 F, purulent drainage from wound, increased redness or drainage from wound, foul odor from the wound/dressing, or calf pain - CONTACT YOUR SURGEON.                                                   FOLLOW-UP APPOINTMENTS:  If you do not already have a post-op appointment, please call the office for an appointment to be seen by your surgeon.  Guidelines for how soon to be seen are listed in your "After Visit Summary", but are typically between 1-4 weeks after surgery.  OTHER INSTRUCTIONS:   Knee Replacement:  Do not place pillow under knee, focus on keeping the knee straight while resting. CPM instructions: 0-90 degrees, 2 hours in the morning, 2 hours in the afternoon, and 2 hours in the evening. Place foam block, curve side up under heel at all times except when in CPM or when walking.  DO NOT modify, tear, cut, or change the foam block in any way.   DENTAL ANTIBIOTICS:  In most cases prophylactic antibiotics for Dental procdeures after total joint surgery are not necessary.  Exceptions are as follows:  1. History of prior total joint infection  2. Severely immunocompromised (Organ Transplant, cancer chemotherapy, Rheumatoid biologic meds such as Humera)  3. Poorly controlled diabetes  (A1C &gt; 8.0, blood glucose over 200)  If you have one of these conditions, contact your surgeon for an antibiotic prescription, prior to your dental procedure.   MAKE SURE YOU:  Understand these instructions.  Get help right away if you are not doing well or get worse.    Thank you for letting us be a part of your medical care team.  It is a privilege we respect greatly.  We hope these instructions will help you stay on track  for a fast and full recovery!   Increase activity slowly as tolerated   Complete by: As directed        Follow-up Information    Marcene Corning, MD. Schedule an appointment as soon as possible for a visit in 2 weeks.   Specialty: Orthopedic Surgery Contact information: [address] [place] [address] [phone]                Signed: Ginger Organ Kandra Graven 03/25/2020, 8:05 AM

## 2020-03-25 NOTE — TOC Initial Note (Signed)
Transition of Care [facility]) - Initial/Assessment Note    Patient Details  Name: Sandra Bird MRN: [id number] Date of Birth: 16-Nov-1970  Transition of Care [facility] CM/SW Contact:    Lia Hopping, [place] Phone Number: 03/25/2020, 11:51 AM  Clinical Narrative:                 Re: Home Health PT and DME needs.  Patient met with the patient at bedside to discuss therapy and DME needs. Patient does not have [place] or DME. 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 Patient provided contact for a Valinda Party [phone]. CSW reached out several times, unable to reach. Left a voicemail.   CSW reached out to [place]. She provided an alternative contact Tiffany Pennix [phone].  CSW reached out and received a follow up call from Ms.Norman Herrlich. She reports the RW and 3 in 1 have been ordered through [facility] and will be delivered to the patient home today or tomorrow. The agency has not received orders for home health. CSW faxed orders to [phone]. She reports [facility] will also arrange home health.   Ms. Norman Herrlich to provide updates to the patient and csw.   Expected Discharge Plan: [place]     Patient Goals and CMS Choice        Expected Discharge Plan and Services Expected Discharge Plan: [place]       Living arrangements for the past 2 months: Single Family Home Expected Discharge Date: 03/25/20                                   Prior Living Arrangements/Services Living arrangements for the past 2 months: Single Family Home   Patient language and need for interpreter reviewed:: No Do you feel safe going back to the place where you live?: Yes      Need for Family Participation in Patient Care: Yes (Comment) Care giver support system in place?: Yes (comment)   Criminal Activity/Legal Involvement Pertinent to Current Situation/Hospitalization: No - Comment as needed  Activities  of Daily Living Home Assistive Devices/Equipment: None ADL Screening (condition at time of admission) Patient's cognitive ability adequate to safely complete daily activities?: Yes Is the patient deaf or have difficulty hearing?: No Does the patient have difficulty seeing, even when wearing glasses/contacts?: No Does the patient have difficulty concentrating, remembering, or making decisions?: No Patient able to express need for assistance with ADLs?: Yes Does the patient have difficulty dressing or bathing?: No Independently performs ADLs?: Yes (appropriate for developmental age) Does the patient have difficulty walking or climbing stairs?: Yes Weakness of Legs: Right Weakness of Arms/Hands: None  Permission Sought/Granted   Permission granted to share information with : Yes, Verbal Permission Granted     Permission granted to share info w AGENCY: [employer]        Emotional Assessment Appearance:: Appears stated age Attitude/Demeanor/Rapport: Engaged Affect (typically observed): Accepting Orientation: : Oriented to Self, Oriented to Place, Oriented to  Time, Oriented to Situation Alcohol / Substance Use: Not Applicable Psych Involvement: No (comment)  Admission diagnosis:  Primary osteoarthritis of right hip [M16.11] Patient Active Problem List   Diagnosis Date Noted  . Primary localized osteoarthritis of right hip 03/24/2020  . Primary osteoarthritis of right hip 03/24/2020  . OBSTRUCTIVE SLEEP APNEA 05/27/2009   PCP:  Shella Spearing, PA-C Pharmacy:  [facility] [phone] - [place], [address] [place] [address] Phone: [phone] Fax: [phone]  [place], [place] - [address] [address] Phone: [phone] Fax: [phone]     Social Determinants of Health (SDOH) Interventions     Readmission Risk Interventions No flowsheet data found.

## 2020-03-25 NOTE — Plan of Care (Signed)
Patient discharged home in stable condition 

## 2020-04-06 MED FILL — CLOTRIMAZOLE-BETAMETHASONE: 1-0.05 | 30 days supply | Qty: 45 | Fill #0

## 2020-04-16 MED FILL — CLOTRIMAZOLE-BETAMETHASONE: 1-0.05 | 30 days supply | Qty: 45 | Fill #0

## 2020-04-29 ENCOUNTER — Other Ambulatory Visit (HOSPITAL_COMMUNITY): Payer: Self-pay | Admitting: Orthopedic Surgery

## 2020-04-29 MED FILL — CELECOXIB 200 MG CAP: 200 | 90 days supply | Qty: 90 | Fill #0

## 2020-04-30 MED FILL — LINZESS 145 MCG CAPSULE: 145 | 30 days supply | Qty: 30 | Fill #1

## 2020-05-06 MED FILL — LATANOPROST 0.005% OPTH SOL: 0.005 | 50 days supply | Qty: 5 | Fill #2

## 2020-05-25 ENCOUNTER — Other Ambulatory Visit (HOSPITAL_COMMUNITY): Payer: Self-pay | Admitting: Physician Assistant

## 2020-05-25 MED FILL — PHENTERMINE 37.5 MG TABLET: 37.5 | 30 days supply | Qty: 30 | Fill #0

## 2020-06-01 ENCOUNTER — Other Ambulatory Visit (HOSPITAL_COMMUNITY): Payer: Self-pay | Admitting: Orthopaedic Surgery

## 2020-06-01 MED FILL — tiZANidine HCL 4 MG TABS: 4 | 15 days supply | Qty: 30 | Fill #0

## 2020-06-03 MED FILL — VIT D2 1.25 MG (50,000 UNIT: 1.25 MG | 84 days supply | Qty: 12 | Fill #2

## 2020-06-03 MED FILL — ATORVASTATIN CALCIUM 20 MG: 20 | 90 days supply | Qty: 90 | Fill #1

## 2020-06-04 MED FILL — CLOTRIMAZOLE-BETAMETHASONE: 1-0.05 | 30 days supply | Qty: 45 | Fill #1

## 2020-06-13 MED FILL — LATANOPROST 0.005% OPTH SOL: 0.005 | 50 days supply | Qty: 5 | Fill #3

## 2020-06-22 MED FILL — EUCRISA 2% OINTMENT: 2 | 30 days supply | Qty: 60 | Fill #0

## 2020-06-23 ENCOUNTER — Other Ambulatory Visit (HOSPITAL_COMMUNITY): Payer: Self-pay | Admitting: Physician Assistant

## 2020-06-23 MED FILL — TOPIRAMATE 50 MG TABLET: 50 | 30 days supply | Qty: 30 | Fill #0

## 2020-06-23 MED FILL — PHENTERMINE 37.5 MG TABLET: 37.5 | 30 days supply | Qty: 30 | Fill #0

## 2020-07-16 DIAGNOSIS — R29818 Other symptoms and signs involving the nervous system: Secondary | ICD-10-CM | POA: Insufficient documentation

## 2020-07-21 DIAGNOSIS — Z9889 Other specified postprocedural states: Secondary | ICD-10-CM | POA: Insufficient documentation

## 2020-07-24 ENCOUNTER — Other Ambulatory Visit (HOSPITAL_COMMUNITY): Payer: Self-pay | Admitting: Physician Assistant

## 2020-07-30 ENCOUNTER — Other Ambulatory Visit (HOSPITAL_COMMUNITY): Payer: Self-pay

## 2020-08-01 ENCOUNTER — Other Ambulatory Visit (HOSPITAL_COMMUNITY): Payer: Self-pay

## 2020-08-03 ENCOUNTER — Other Ambulatory Visit (HOSPITAL_COMMUNITY): Payer: Self-pay

## 2020-08-03 MED ORDER — LATANOPROST 0.005 % OP SOLN
OPHTHALMIC | 3 refills | Status: DC
  Filled 2020-08-03: qty 7.5, 75d supply, fill #0
  Filled 2020-09-28: qty 7.5, 75d supply, fill #1
  Filled 2021-01-18 – 2021-01-20 (×2): qty 7.5, 75d supply, fill #2
  Filled 2021-03-30: qty 7.5, 75d supply, fill #3

## 2020-08-05 ENCOUNTER — Other Ambulatory Visit (HOSPITAL_COMMUNITY): Payer: Self-pay

## 2020-08-05 MED FILL — Linaclotide Cap 145 MCG: ORAL | 30 days supply | Qty: 30 | Fill #0 | Status: AC

## 2020-08-20 ENCOUNTER — Other Ambulatory Visit (HOSPITAL_COMMUNITY): Payer: Self-pay

## 2020-08-22 ENCOUNTER — Other Ambulatory Visit (HOSPITAL_COMMUNITY): Payer: Self-pay

## 2020-08-22 MED FILL — Phentermine HCl Tab 37.5 MG: ORAL | 30 days supply | Qty: 30 | Fill #0 | Status: AC

## 2020-08-24 ENCOUNTER — Other Ambulatory Visit (HOSPITAL_COMMUNITY): Payer: Self-pay

## 2020-08-24 MED FILL — Liraglutide (Weight Mngmt) Soln Pen-Inj 18 MG/3ML (6 MG/ML): SUBCUTANEOUS | 30 days supply | Qty: 15 | Fill #0 | Status: AC

## 2020-09-15 ENCOUNTER — Other Ambulatory Visit (HOSPITAL_COMMUNITY): Payer: Self-pay | Admitting: Obstetrics and Gynecology

## 2020-09-15 ENCOUNTER — Other Ambulatory Visit (HOSPITAL_COMMUNITY): Payer: Self-pay

## 2020-09-16 ENCOUNTER — Other Ambulatory Visit (HOSPITAL_COMMUNITY): Payer: Self-pay

## 2020-09-16 MED ORDER — CLOTRIMAZOLE-BETAMETHASONE 1-0.05 % EX CREA
TOPICAL_CREAM | CUTANEOUS | 1 refills | Status: DC
  Filled 2020-09-16: qty 45, 20d supply, fill #0
  Filled 2021-01-04: qty 45, 20d supply, fill #1

## 2020-09-22 ENCOUNTER — Other Ambulatory Visit (HOSPITAL_COMMUNITY): Payer: Self-pay

## 2020-09-23 ENCOUNTER — Other Ambulatory Visit (HOSPITAL_COMMUNITY): Payer: Self-pay

## 2020-09-25 ENCOUNTER — Other Ambulatory Visit (HOSPITAL_COMMUNITY): Payer: Self-pay

## 2020-09-28 ENCOUNTER — Other Ambulatory Visit (HOSPITAL_COMMUNITY): Payer: Self-pay

## 2020-09-29 ENCOUNTER — Other Ambulatory Visit (HOSPITAL_COMMUNITY): Payer: Self-pay

## 2020-09-30 ENCOUNTER — Other Ambulatory Visit (HOSPITAL_COMMUNITY): Payer: Self-pay

## 2020-09-30 MED ORDER — TIZANIDINE HCL 4 MG PO TABS
ORAL_TABLET | ORAL | 0 refills | Status: DC
Start: 2020-09-30 — End: 2021-12-06
  Filled 2020-09-30: qty 30, 15d supply, fill #0

## 2020-09-30 MED ORDER — DENDRACIN NEURODENDRAXCIN 0.025-10-30 % EX LOTN
TOPICAL_LOTION | CUTANEOUS | 0 refills | Status: DC
Start: 2020-09-30 — End: 2022-01-31

## 2020-09-30 MED ORDER — DICLOFENAC SODIUM 1.5 % EX SOLN
CUTANEOUS | 0 refills | Status: DC
  Filled 2020-09-30: qty 150, 30d supply, fill #0

## 2020-09-30 MED ORDER — CELECOXIB 200 MG PO CAPS
ORAL_CAPSULE | ORAL | 1 refills | Status: DC
Start: 2020-09-30 — End: 2022-03-03
  Filled 2020-09-30: qty 90, 90d supply, fill #0

## 2020-10-01 ENCOUNTER — Other Ambulatory Visit (HOSPITAL_COMMUNITY): Payer: Self-pay

## 2020-10-05 ENCOUNTER — Other Ambulatory Visit (HOSPITAL_COMMUNITY): Payer: Self-pay

## 2020-10-08 ENCOUNTER — Other Ambulatory Visit (HOSPITAL_COMMUNITY): Payer: Self-pay

## 2020-10-13 ENCOUNTER — Other Ambulatory Visit (HOSPITAL_COMMUNITY): Payer: Self-pay

## 2020-10-13 MED FILL — Linaclotide Cap 145 MCG: ORAL | 30 days supply | Qty: 30 | Fill #1 | Status: AC

## 2020-10-15 ENCOUNTER — Other Ambulatory Visit (HOSPITAL_COMMUNITY): Payer: Self-pay

## 2020-10-16 ENCOUNTER — Other Ambulatory Visit (HOSPITAL_COMMUNITY): Payer: Self-pay

## 2020-10-16 MED FILL — Clotrimazole w/ Betamethasone Cream 1-0.05%: CUTANEOUS | 30 days supply | Qty: 45 | Fill #0 | Status: AC

## 2020-10-21 ENCOUNTER — Other Ambulatory Visit (HOSPITAL_COMMUNITY): Payer: Self-pay

## 2020-10-28 ENCOUNTER — Other Ambulatory Visit (HOSPITAL_COMMUNITY): Payer: Self-pay

## 2020-11-04 ENCOUNTER — Other Ambulatory Visit (HOSPITAL_COMMUNITY): Payer: Self-pay

## 2020-11-05 ENCOUNTER — Other Ambulatory Visit (HOSPITAL_COMMUNITY): Payer: Self-pay

## 2020-11-13 ENCOUNTER — Other Ambulatory Visit (HOSPITAL_COMMUNITY): Payer: Self-pay

## 2020-11-23 ENCOUNTER — Other Ambulatory Visit (HOSPITAL_COMMUNITY): Payer: Self-pay

## 2020-12-09 DIAGNOSIS — N921 Excessive and frequent menstruation with irregular cycle: Secondary | ICD-10-CM | POA: Insufficient documentation

## 2020-12-09 DIAGNOSIS — B356 Tinea cruris: Secondary | ICD-10-CM | POA: Insufficient documentation

## 2020-12-09 DIAGNOSIS — E785 Hyperlipidemia, unspecified: Secondary | ICD-10-CM | POA: Insufficient documentation

## 2020-12-25 ENCOUNTER — Other Ambulatory Visit (HOSPITAL_COMMUNITY): Payer: Self-pay

## 2020-12-25 MED ORDER — CLOTRIMAZOLE-BETAMETHASONE 1-0.05 % EX CREA
TOPICAL_CREAM | CUTANEOUS | 0 refills | Status: DC
  Filled 2020-12-25: qty 45, 14d supply, fill #0

## 2021-01-02 ENCOUNTER — Other Ambulatory Visit (HOSPITAL_COMMUNITY): Payer: Self-pay

## 2021-01-04 ENCOUNTER — Other Ambulatory Visit (HOSPITAL_COMMUNITY): Payer: Self-pay

## 2021-01-19 ENCOUNTER — Other Ambulatory Visit (HOSPITAL_COMMUNITY): Payer: Self-pay

## 2021-01-20 ENCOUNTER — Other Ambulatory Visit (HOSPITAL_COMMUNITY): Payer: Self-pay

## 2021-01-21 ENCOUNTER — Other Ambulatory Visit (HOSPITAL_COMMUNITY): Payer: Self-pay

## 2021-01-26 ENCOUNTER — Other Ambulatory Visit (HOSPITAL_COMMUNITY): Payer: Self-pay

## 2021-02-15 ENCOUNTER — Other Ambulatory Visit (HOSPITAL_COMMUNITY): Payer: Self-pay

## 2021-02-16 ENCOUNTER — Other Ambulatory Visit (HOSPITAL_COMMUNITY): Payer: Self-pay

## 2021-02-17 ENCOUNTER — Other Ambulatory Visit (HOSPITAL_COMMUNITY): Payer: Self-pay

## 2021-02-17 MED ORDER — CLOTRIMAZOLE-BETAMETHASONE 1-0.05 % EX CREA
TOPICAL_CREAM | CUTANEOUS | 1 refills | Status: DC
  Filled 2021-02-17: qty 45, 30d supply, fill #0
  Filled 2021-07-21: qty 45, 30d supply, fill #1

## 2021-02-17 MED FILL — Crisaborole Oint 2%: CUTANEOUS | 20 days supply | Qty: 60 | Fill #0 | Status: AC

## 2021-02-18 ENCOUNTER — Other Ambulatory Visit (HOSPITAL_COMMUNITY): Payer: Self-pay

## 2021-02-23 ENCOUNTER — Other Ambulatory Visit (HOSPITAL_COMMUNITY): Payer: Self-pay

## 2021-02-23 MED ORDER — PHENTERMINE HCL 15 MG PO CAPS
ORAL_CAPSULE | ORAL | 0 refills | Status: DC
  Filled 2021-02-23: qty 30, 30d supply, fill #0

## 2021-02-24 ENCOUNTER — Other Ambulatory Visit (HOSPITAL_COMMUNITY): Payer: Self-pay

## 2021-02-24 MED ORDER — VITAMIN D (ERGOCALCIFEROL) 1.25 MG (50000 UNIT) PO CAPS
ORAL_CAPSULE | ORAL | 2 refills | Status: DC
  Filled 2021-02-24: qty 12, 84d supply, fill #0

## 2021-02-25 ENCOUNTER — Other Ambulatory Visit (HOSPITAL_COMMUNITY): Payer: Self-pay

## 2021-02-25 MED FILL — Liraglutide (Weight Mngmt) Soln Pen-Inj 18 MG/3ML (6 MG/ML): SUBCUTANEOUS | 30 days supply | Qty: 15 | Fill #0 | Status: AC

## 2021-02-26 ENCOUNTER — Other Ambulatory Visit (HOSPITAL_COMMUNITY): Payer: Self-pay

## 2021-03-01 ENCOUNTER — Other Ambulatory Visit (HOSPITAL_COMMUNITY): Payer: Self-pay

## 2021-03-01 MED ORDER — SAXENDA 18 MG/3ML ~~LOC~~ SOPN: PEN_INJECTOR | SUBCUTANEOUS | 1 refills | Status: DC

## 2021-03-12 ENCOUNTER — Other Ambulatory Visit (HOSPITAL_COMMUNITY): Payer: Self-pay

## 2021-03-12 MED FILL — Liraglutide (Weight Mngmt) Soln Pen-Inj 18 MG/3ML (6 MG/ML): SUBCUTANEOUS | 30 days supply | Qty: 15 | Fill #1 | Status: CN

## 2021-03-23 ENCOUNTER — Other Ambulatory Visit (HOSPITAL_COMMUNITY): Payer: Self-pay

## 2021-03-23 MED ORDER — PHENTERMINE HCL 30 MG PO CAPS
30.0000 mg | ORAL_CAPSULE | Freq: Every day | ORAL | 0 refills | Status: DC
  Filled 2021-03-23: qty 30, 30d supply, fill #0

## 2021-03-24 ENCOUNTER — Other Ambulatory Visit (HOSPITAL_COMMUNITY): Payer: Self-pay

## 2021-03-24 MED FILL — Liraglutide (Weight Mngmt) Soln Pen-Inj 18 MG/3ML (6 MG/ML): SUBCUTANEOUS | 30 days supply | Qty: 15 | Fill #1 | Status: AC

## 2021-03-30 ENCOUNTER — Other Ambulatory Visit (HOSPITAL_COMMUNITY): Payer: Self-pay

## 2021-04-01 ENCOUNTER — Other Ambulatory Visit (HOSPITAL_COMMUNITY): Payer: Self-pay

## 2021-05-19 ENCOUNTER — Other Ambulatory Visit (HOSPITAL_COMMUNITY): Payer: Self-pay

## 2021-05-19 ENCOUNTER — Other Ambulatory Visit (HOSPITAL_COMMUNITY): Payer: Self-pay | Admitting: Orthopedic Surgery

## 2021-05-19 MED ORDER — CELECOXIB 200 MG PO CAPS
ORAL_CAPSULE | ORAL | 1 refills | Status: DC
  Filled 2021-05-19: qty 90, 90d supply, fill #0

## 2021-05-20 ENCOUNTER — Other Ambulatory Visit (HOSPITAL_COMMUNITY): Payer: Self-pay

## 2021-05-20 MED ORDER — PHENTERMINE HCL 37.5 MG PO CAPS
ORAL_CAPSULE | ORAL | 0 refills | Status: DC
  Filled 2021-05-20: qty 30, 30d supply, fill #0

## 2021-05-20 MED ORDER — ERGOCALCIFEROL 1.25 MG (50000 UT) PO CAPS
ORAL_CAPSULE | ORAL | 2 refills | Status: DC
  Filled 2021-05-20: qty 12, 84d supply, fill #0
  Filled 2021-08-28: qty 12, 84d supply, fill #1

## 2021-05-20 MED ORDER — SAXENDA 18 MG/3ML ~~LOC~~ SOPN
PEN_INJECTOR | SUBCUTANEOUS | 1 refills | Status: DC
  Filled 2021-05-20: qty 15, 30d supply, fill #0

## 2021-06-01 ENCOUNTER — Other Ambulatory Visit (HOSPITAL_COMMUNITY): Payer: Self-pay

## 2021-06-02 ENCOUNTER — Other Ambulatory Visit (HOSPITAL_COMMUNITY): Payer: Self-pay

## 2021-06-04 ENCOUNTER — Other Ambulatory Visit (HOSPITAL_COMMUNITY): Payer: Self-pay

## 2021-06-04 MED ORDER — LATANOPROST 0.005 % OP SOLN
OPHTHALMIC | 3 refills | Status: DC
  Filled 2021-06-04: qty 7.5, 75d supply, fill #0
  Filled 2021-08-02: qty 7.5, 75d supply, fill #1
  Filled 2021-10-02: qty 7.5, 75d supply, fill #2
  Filled 2021-11-19: qty 7.5, 75d supply, fill #3

## 2021-06-11 ENCOUNTER — Other Ambulatory Visit (HOSPITAL_COMMUNITY): Payer: Self-pay

## 2021-06-12 ENCOUNTER — Other Ambulatory Visit (HOSPITAL_COMMUNITY): Payer: Self-pay

## 2021-06-14 ENCOUNTER — Other Ambulatory Visit (HOSPITAL_COMMUNITY): Payer: Self-pay

## 2021-06-14 MED ORDER — EUCRISA 2 % EX OINT
TOPICAL_OINTMENT | CUTANEOUS | 1 refills | Status: DC
  Filled 2021-06-14: qty 60, 30d supply, fill #0

## 2021-06-14 MED ORDER — ERGOCALCIFEROL 1.25 MG (50000 UT) PO CAPS
ORAL_CAPSULE | ORAL | 2 refills | Status: DC
  Filled 2021-06-14: qty 12, 84d supply, fill #0

## 2021-06-14 MED ORDER — CLOBETASOL PROPIONATE 0.05 % EX CREA
TOPICAL_CREAM | CUTANEOUS | 1 refills | Status: DC
  Filled 2021-06-14: qty 15, 15d supply, fill #0
  Filled 2021-07-21: qty 15, 15d supply, fill #1

## 2021-06-14 MED ORDER — ZOLPIDEM TARTRATE 10 MG PO TABS
ORAL_TABLET | ORAL | 0 refills | Status: DC
  Filled 2021-06-14: qty 30, 30d supply, fill #0

## 2021-06-15 ENCOUNTER — Other Ambulatory Visit (HOSPITAL_COMMUNITY): Payer: Self-pay

## 2021-06-21 ENCOUNTER — Other Ambulatory Visit (HOSPITAL_COMMUNITY): Payer: Self-pay

## 2021-06-21 MED ORDER — SAXENDA 18 MG/3ML ~~LOC~~ SOPN
PEN_INJECTOR | SUBCUTANEOUS | 1 refills | Status: DC
  Filled 2021-06-21: qty 15, 30d supply, fill #0
  Filled 2021-08-27: qty 15, 30d supply, fill #1

## 2021-06-21 MED ORDER — PHENTERMINE HCL 37.5 MG PO CAPS
ORAL_CAPSULE | ORAL | 0 refills | Status: DC
  Filled 2021-06-21: qty 30, 30d supply, fill #0

## 2021-06-21 MED ORDER — VITAMIN D (ERGOCALCIFEROL) 1.25 MG (50000 UNIT) PO CAPS
ORAL_CAPSULE | ORAL | 2 refills | Status: DC
  Filled 2021-06-21: qty 12, 84d supply, fill #0

## 2021-06-22 ENCOUNTER — Other Ambulatory Visit (HOSPITAL_COMMUNITY): Payer: Self-pay

## 2021-06-24 ENCOUNTER — Other Ambulatory Visit (HOSPITAL_COMMUNITY): Payer: Self-pay

## 2021-06-24 MED ORDER — PANTOPRAZOLE SODIUM 40 MG PO TBEC
DELAYED_RELEASE_TABLET | ORAL | 2 refills | Status: DC
  Filled 2021-06-24: qty 30, 30d supply, fill #0
  Filled 2021-07-21: qty 30, 30d supply, fill #1
  Filled 2021-08-28: qty 30, 30d supply, fill #2

## 2021-07-21 ENCOUNTER — Other Ambulatory Visit (HOSPITAL_COMMUNITY): Payer: Self-pay

## 2021-07-22 ENCOUNTER — Other Ambulatory Visit (HOSPITAL_COMMUNITY): Payer: Self-pay

## 2021-08-02 ENCOUNTER — Other Ambulatory Visit (HOSPITAL_COMMUNITY): Payer: Self-pay

## 2021-08-27 ENCOUNTER — Other Ambulatory Visit (HOSPITAL_COMMUNITY): Payer: Self-pay

## 2021-08-28 ENCOUNTER — Other Ambulatory Visit (HOSPITAL_COMMUNITY): Payer: Self-pay

## 2021-09-02 ENCOUNTER — Other Ambulatory Visit (HOSPITAL_COMMUNITY): Payer: Self-pay

## 2021-09-03 ENCOUNTER — Other Ambulatory Visit (HOSPITAL_COMMUNITY): Payer: Self-pay

## 2021-09-03 MED ORDER — LINZESS 145 MCG PO CAPS
ORAL_CAPSULE | ORAL | 6 refills | Status: DC
  Filled 2021-09-03: qty 30, 30d supply, fill #0

## 2021-09-04 ENCOUNTER — Other Ambulatory Visit (HOSPITAL_COMMUNITY): Payer: Self-pay

## 2021-09-08 ENCOUNTER — Other Ambulatory Visit (HOSPITAL_COMMUNITY): Payer: Self-pay

## 2021-09-08 DIAGNOSIS — R3129 Other microscopic hematuria: Secondary | ICD-10-CM | POA: Insufficient documentation

## 2021-09-08 MED ORDER — CLOTRIMAZOLE-BETAMETHASONE 1-0.05 % EX CREA
TOPICAL_CREAM | CUTANEOUS | 2 refills | Status: DC
  Filled 2021-09-08: qty 45, 30d supply, fill #0
  Filled 2022-01-12: qty 45, 30d supply, fill #1
  Filled 2022-07-12: qty 45, 30d supply, fill #2

## 2021-09-11 ENCOUNTER — Other Ambulatory Visit (HOSPITAL_COMMUNITY): Payer: Self-pay

## 2021-09-13 ENCOUNTER — Other Ambulatory Visit (HOSPITAL_COMMUNITY): Payer: Self-pay

## 2021-09-16 ENCOUNTER — Other Ambulatory Visit (HOSPITAL_COMMUNITY): Payer: Self-pay

## 2021-09-24 ENCOUNTER — Ambulatory Visit: Payer: BC Managed Care – PPO | Admitting: Plastic Surgery

## 2021-09-24 VITALS — BP 133/87 | HR 85 | Ht 62.0 in | Wt 248.2 lb

## 2021-09-24 DIAGNOSIS — M549 Dorsalgia, unspecified: Secondary | ICD-10-CM | POA: Diagnosis not present

## 2021-09-24 DIAGNOSIS — M793 Panniculitis, unspecified: Secondary | ICD-10-CM

## 2021-09-24 DIAGNOSIS — R21 Rash and other nonspecific skin eruption: Secondary | ICD-10-CM | POA: Diagnosis not present

## 2021-09-24 NOTE — Progress Notes (Signed)
Referring Provider Shella Spearing, PA-C [place],  [place] [address]   CC:  Abdominal rashes  Sandra Bird is an 51 y.o. female.  HPI: The patient is a 51 year old with a history of abdominal rashes under her pannus.  She typically uses cortisone cream for treatment of her rashes.  Her past medical history is notable for laparoscopic gallbladder surgery, right hip replacement.  She also has significant back pain.  Of note she has lost 60 pounds recently.  She has been using weight loss shots.  She does not use any tobacco products.    Allergies  Allergen Reactions   Penicillins Rash    Has patient had a PCN reaction causing immediate rash, facial/tongue/throat swelling, SOB or lightheadedness with hypotension: Yes Has patient had a PCN reaction causing severe rash involving mucus membranes or skin necrosis: No Has patient had a PCN reaction that required hospitalization No Has patient had a PCN reaction occurring within the last 10 years: No If all of the above answers are "NO", then may proceed with Cephalosporin use.     Outpatient Encounter Medications as of 09/24/2021  Medication Sig Note   aspirin EC 81 MG tablet Take 1 tablet (81 mg total) by mouth 2 (two) times daily after a meal. Swallow whole.    atorvastatin (LIPITOR) 20 MG tablet Take 20 mg by mouth daily.    Capsaicin-Menthol-Methyl Sal (DENDRACIN NEURODENDRAXCIN) 0.025-10-30 % LOTN APPLY SMALL AMOUNT TO THE AFFECTED AREA UP TO THREE TIMES DAILY AS NEEDED FOR PAIN - DO NOT USE THE SAME TIME AS PENNSAID    celecoxib (CELEBREX) 200 MG capsule Take 1 capsule by mouth once a day with food as needed for pain    celecoxib (CELEBREX) 200 MG capsule TAKE 1 CAPSULE BY MOUTH ONCE A DAY AS NEEDED FOR PAIN. TAKE WITH FOOD.    clobetasol cream (TEMOVATE) 0.05 % Apply to affected area two times daily    clotrimazole-betamethasone (LOTRISONE) cream Apply to affected area twice daily for 2 weeks     clotrimazole-betamethasone (LOTRISONE) cream APPLY A THIN COAT DAILY AS NEEDED    clotrimazole-betamethasone (LOTRISONE) cream APPLY A THIN COAT DAILY AS NEEDED    Crisaborole (EUCRISA) 2 % OINT Apply to the affected area two times daily    Diclofenac Sodium 1.5 % SOLN APPLY SMALL AMOUNT (ABOUT 10 DROPS) TO THE AFFECTED AREA TWICE DAILY AS NEEDED FOR PAIN - DO NOT USE THE SAME TIME AS DENDRACIN    ergocalciferol (VITAMIN D2) 1.25 MG (50000 UT) capsule Take 1 capsule by mouth once a week.    ergocalciferol (VITAMIN D2) 1.25 MG (50000 UT) capsule Take 1 capsule by mouth once a week    latanoprost (XALATAN) 0.005 % ophthalmic solution Place 1 drop into both eyes at bedtime.    latanoprost (XALATAN) 0.005 % ophthalmic solution Place 1 drop into both eyes every evening    linaclotide (LINZESS) 145 MCG CAPS capsule Take 1 capsule by mouth daily 30 minutes before the first meal of the day on an empty stomach    Liraglutide -Weight Management (SAXENDA) 18 MG/3ML SOPN Inject 0.6 mg into the skin daily for 7 days,1.2 mg daily for 7 days,1.8 mg daily for 7 days,2.4 mg daily for 7 days then 3 mg daily for 7 days    Liraglutide -Weight Management (SAXENDA) 18 MG/3ML SOPN Inject 3 mg under the skin daily    Liraglutide -Weight Management (SAXENDA) 18 MG/3ML SOPN Inject 3 mg subcutaneously daily.  Multiple Vitamin (MULTIVITAMIN WITH MINERALS) TABS tablet Take 1 tablet by mouth daily.    pantoprazole (PROTONIX) 40 MG tablet Take 1 tablet by mouth once a day    phentermine 15 MG capsule Take 1 (one) Capsule by mouth once daily    phentermine 30 MG capsule Take 1 capsule (30 mg total) by mouth daily.    phentermine 37.5 MG capsule Take 37.5 mg by mouth every morning.    phentermine 37.5 MG capsule Take 1 capsule by mouth daily.    pravastatin (PRAVACHOL) 10 MG tablet Take 10 mg by mouth at bedtime.    SAXENDA 18 MG/3ML SOPN Inject 3 mg into the skin daily.     tiZANidine (ZANAFLEX) 4 MG tablet TAKE 1 TABLET BY  MOUTH TWICE A DAY WITH FOOD AS NEEDED FOR SPASMS    Vitamin D, Ergocalciferol, (DRISDOL) 1.25 MG (50000 UNIT) CAPS capsule Take 1 capsule by mouth once a week    Vitamin D, Ergocalciferol, (DRISDOL) 1.25 MG (50000 UNIT) CAPS capsule Take 1 capsule by mouth once a week.    Vitamin D, Ergocalciferol, (DRISDOL) 50000 UNITS CAPS capsule Take 50,000 Units by mouth every 7 (seven) days. Monday 03/24/2020: Too this Vit D yesterday   zolpidem (AMBIEN) 10 MG tablet ake 1/2 to 1 tablet by mouth 30 minutes before bedtime as needed for insomnia    atorvastatin (LIPITOR) 20 MG tablet TAKE 1 TABLET BY MOUTH ONCE DAILY    linaclotide (LINZESS) 145 MCG CAPS capsule TAKE 1 CAPSULE BY MOUTH AT LEAST 30 MINUTES BEFORE THE FIRST MEAL OF THE DAY ON AN EMPTY STOMACH    phentermine (ADIPEX-P) 37.5 MG tablet TAKE 1 TABLET BY MOUTH ONCE A DAY    phentermine (ADIPEX-P) 37.5 MG tablet TAKE 1 TABLET BY MOUTH DAILY    phentermine (ADIPEX-P) 37.5 MG tablet TAKE 1 TABLET BY MOUTH DAILY    phentermine (ADIPEX-P) 37.5 MG tablet TAKE 1 TABLET BY MOUTH DAILY    topiramate (TOPAMAX) 50 MG tablet TAIE ONE TABLET BY MOUTH AT BEDTIME    topiramate (TOPAMAX) 50 MG tablet TAKE 1 TABLET BY MOUTH AT BEDTIME    No facility-administered encounter medications on file as of 09/24/2021.     Past Medical History:  Diagnosis Date   Arthritis    SVD (spontaneous vaginal delivery)    x 3    Past Surgical History:  Procedure Laterality Date   CHOLECYSTECTOMY     DILITATION & CURRETTAGE/HYSTROSCOPY WITH NOVASURE ABLATION N/A 07/25/2013   Procedure: DILATATION & CURETTAGE/HYSTEROSCOPY WITH NOVASURE ABLATION;  Surgeon: Gus Height, MD;  Location: [facility];  Service: Gynecology;  Laterality: N/A;   TONSILLECTOMY     TOTAL HIP ARTHROPLASTY Right 03/24/2020   Procedure: RIGHT TOTAL HIP ARTHROPLASTY ANTERIOR APPROACH;  Surgeon: Melrose Nakayama, MD;  Location: [facility];  Service: Orthopedics;  Laterality: Right;   TUBAL LIGATION     WISDOM TOOTH  EXTRACTION      Family History  Problem Relation Age of Onset   Heart failure Mother    Heart attack Father     Social History   Social History Narrative   Not on file     Review of Systems General: Denies fevers, chills, weight loss CV: Denies chest pain, shortness of breath, palpitations   Physical Exam    09/24/2021   11:48 AM 03/25/2020    1:00 PM 03/25/2020    9:39 AM  Vitals with BMI  Height 5\' 2"     Weight 248 lbs 3 oz  BMI [id number]    Systolic [id number]  Diastolic 87 67 46  Pulse 85 76 72    General:  No acute distress,  Alert and oriented, Non-Toxic, Normal speech and affect Abdomen: Significant excess skin above and below umbilicus.  Some adipose and abdomen.  Laparoscopic scars well-healed.  No hernia appreciated.  Abdominal pannus hangs below her pubis.  She does have some asymmetry and that her right-sided pannus is significantly smaller and narrower than the left side.  Assessment/Plan 1) panniculectomy: Patient would be expected to get significant benefit from abdominal panniculectomy in terms of improvement of rashes. 2) patient would get additional cosmetic benefit from transposition of the umbilicus and possibly rectus plication.   Lennice Sites 09/24/2021, 12:41 PM

## 2021-09-27 ENCOUNTER — Other Ambulatory Visit (HOSPITAL_COMMUNITY): Payer: Self-pay

## 2021-09-27 MED ORDER — PANTOPRAZOLE SODIUM 40 MG PO TBEC
40.0000 mg | DELAYED_RELEASE_TABLET | Freq: Every day | ORAL | 5 refills | Status: DC
  Filled 2021-09-27: qty 30, 30d supply, fill #0
  Filled 2021-12-10: qty 30, 30d supply, fill #1
  Filled 2022-01-12: qty 30, 30d supply, fill #2

## 2021-10-02 ENCOUNTER — Other Ambulatory Visit (HOSPITAL_COMMUNITY): Payer: Self-pay

## 2021-10-04 ENCOUNTER — Other Ambulatory Visit (HOSPITAL_COMMUNITY): Payer: Self-pay

## 2021-10-07 ENCOUNTER — Encounter: Payer: Self-pay | Admitting: *Deleted

## 2021-10-07 ENCOUNTER — Telehealth: Payer: Self-pay | Admitting: Plastic Surgery

## 2021-10-07 NOTE — Telephone Encounter (Signed)
Called to find out if patient had a different insurance id than the one we have scanned into Epic because I received an error message through [employer] portal. Patient provided me with the new id number.   Authorization submitted today. Patient is aware it could take up to 4 weeks to receive response.

## 2021-10-18 ENCOUNTER — Telehealth: Payer: Self-pay | Admitting: Plastic Surgery

## 2021-10-18 NOTE — Telephone Encounter (Signed)
error 

## 2021-10-19 ENCOUNTER — Other Ambulatory Visit (HOSPITAL_COMMUNITY): Payer: Self-pay

## 2021-11-10 ENCOUNTER — Telehealth: Payer: Self-pay | Admitting: *Deleted

## 2021-11-10 NOTE — Telephone Encounter (Signed)
Returned call to pt in response to vm left on surgery scheduling line. Pt states she would like to speak to Dr. Domenica Reamer about cosmetic portion of surgery scheduled for 8/31. States she is undecided about doing upper abdomen and may want to do something different. Message forwarded to provider.  Call back # [phone]

## 2021-11-17 ENCOUNTER — Telehealth: Payer: Self-pay | Admitting: *Deleted

## 2021-11-17 NOTE — Telephone Encounter (Signed)
Pt called and voiced additional questions about upcoming surgery. Discussed with Dr. Domenica Reamer and pt scheduled for f/u appt with him (no charge) on 7/28.

## 2021-11-19 ENCOUNTER — Ambulatory Visit (INDEPENDENT_AMBULATORY_CARE_PROVIDER_SITE_OTHER): Payer: BC Managed Care – PPO | Admitting: Family Medicine

## 2021-11-19 ENCOUNTER — Ambulatory Visit (INDEPENDENT_AMBULATORY_CARE_PROVIDER_SITE_OTHER): Payer: Self-pay | Admitting: Plastic Surgery

## 2021-11-19 ENCOUNTER — Encounter: Payer: Self-pay | Admitting: Family Medicine

## 2021-11-19 ENCOUNTER — Other Ambulatory Visit (HOSPITAL_COMMUNITY): Payer: Self-pay

## 2021-11-19 ENCOUNTER — Other Ambulatory Visit: Payer: Self-pay | Admitting: Family Medicine

## 2021-11-19 VITALS — BP 118/80 | HR 75 | Temp 98.1°F | Resp 16 | Ht 64.0 in | Wt 255.0 lb

## 2021-11-19 DIAGNOSIS — N62 Hypertrophy of breast: Secondary | ICD-10-CM

## 2021-11-19 DIAGNOSIS — L309 Dermatitis, unspecified: Secondary | ICD-10-CM

## 2021-11-19 DIAGNOSIS — G4700 Insomnia, unspecified: Secondary | ICD-10-CM

## 2021-11-19 DIAGNOSIS — M793 Panniculitis, unspecified: Secondary | ICD-10-CM

## 2021-11-19 DIAGNOSIS — E7800 Pure hypercholesterolemia, unspecified: Secondary | ICD-10-CM | POA: Insufficient documentation

## 2021-11-19 DIAGNOSIS — M1990 Unspecified osteoarthritis, unspecified site: Secondary | ICD-10-CM | POA: Insufficient documentation

## 2021-11-19 DIAGNOSIS — Z7689 Persons encountering health services in other specified circumstances: Secondary | ICD-10-CM

## 2021-11-19 DIAGNOSIS — Z6841 Body Mass Index (BMI) 40.0 and over, adult: Secondary | ICD-10-CM

## 2021-11-19 MED ORDER — PHENTERMINE HCL 37.5 MG PO CAPS
37.5000 mg | ORAL_CAPSULE | ORAL | 0 refills | Status: DC
  Filled 2021-11-19: qty 30, 30d supply, fill #0

## 2021-11-19 MED ORDER — SAXENDA 18 MG/3ML ~~LOC~~ SOPN
0.6000 mg | PEN_INJECTOR | Freq: Every day | SUBCUTANEOUS | 0 refills | Status: DC
  Filled 2021-11-19 – 2021-11-24 (×2): qty 3, 30d supply, fill #0

## 2021-11-19 MED ORDER — EUCRISA 2 % EX OINT
TOPICAL_OINTMENT | CUTANEOUS | 1 refills | Status: DC
  Filled 2021-11-19: qty 60, 30d supply, fill #0

## 2021-11-19 NOTE — Progress Notes (Signed)
Patient came in to discuss her upcoming surgery.  She had questions about how laterally could go with excision as well as about liposuction.  Physical exam Significant excess abdominal skin above and below umbilicus.  Some subcutaneous fat of her abdomen.  She does have some adipose and some rolls on her back.  Some subcutaneous fat of her back.  Assessment and plan We discussed that her best option is still abdominoplasty.  We can do some liposuction laterally inferior but I do not recommend a circumferential procedure at this time.  We discussed that we could do significant liposuction of her abdomen and of her back as a staged procedure after she completes abdominoplasty.  The rolls of her upper back are difficult to treat without putting a direct incision and I am not sure if she wants this but this could be done in a staged fashion later.

## 2021-11-20 ENCOUNTER — Other Ambulatory Visit (HOSPITAL_COMMUNITY): Payer: Self-pay

## 2021-11-20 ENCOUNTER — Other Ambulatory Visit: Payer: Self-pay | Admitting: Family Medicine

## 2021-11-20 MED ORDER — ERGOCALCIFEROL 1.25 MG (50000 UT) PO CAPS
1.0000 | ORAL_CAPSULE | ORAL | 0 refills | Status: DC
  Filled 2021-11-20: qty 12, 84d supply, fill #0

## 2021-11-20 NOTE — Progress Notes (Addendum)
New Patient Office Visit  Subjective    Patient ID: Sandra Bird, female    DOB: [birth year]  Age: 51 y.o. MRN: [id number]  CC:  Chief Complaint  Patient presents with   Establish Care    HPI Sandra Bird [facility] presents to establish care   Outpatient Encounter Medications as of 11/19/2021  Medication Sig   aspirin EC 81 MG tablet Take 1 tablet (81 mg total) by mouth 2 (two) times daily after a meal. Swallow whole. (Patient not taking: Reported on 11/19/2021)   Capsaicin-Menthol-Methyl Sal (DENDRACIN NEURODENDRAXCIN) 0.025-10-30 % LOTN APPLY SMALL AMOUNT TO THE AFFECTED AREA UP TO THREE TIMES DAILY AS NEEDED FOR PAIN - DO NOT USE THE SAME TIME AS PENNSAID (Patient not taking: Reported on 11/19/2021)   celecoxib (CELEBREX) 200 MG capsule Take 1 capsule by mouth once a day with food as needed for pain   celecoxib (CELEBREX) 200 MG capsule TAKE 1 CAPSULE BY MOUTH ONCE A DAY AS NEEDED FOR PAIN. TAKE WITH FOOD.   clobetasol cream (TEMOVATE) 0.05 % Apply to affected area two times daily   clotrimazole-betamethasone (LOTRISONE) cream Apply to affected area twice daily for 2 weeks   clotrimazole-betamethasone (LOTRISONE) cream APPLY A THIN COAT DAILY AS NEEDED   clotrimazole-betamethasone (LOTRISONE) cream APPLY A THIN COAT DAILY AS NEEDED   Diclofenac Sodium 1.5 % SOLN APPLY SMALL AMOUNT (ABOUT 10 DROPS) TO THE AFFECTED AREA TWICE DAILY AS NEEDED FOR PAIN - DO NOT USE THE SAME TIME AS DENDRACIN   latanoprost (XALATAN) 0.005 % ophthalmic solution Place 1 drop into both eyes every evening   linaclotide (LINZESS) 145 MCG CAPS capsule Take 1 capsule by mouth daily 30 minutes before the first meal of the day on an empty stomach (Patient not taking: Reported on 11/19/2021)   Liraglutide -Weight Management (SAXENDA) 18 MG/3ML SOPN Inject 0.6 mg into the skin daily.   Multiple Vitamin (MULTIVITAMIN WITH MINERALS) TABS tablet Take 1 tablet by mouth daily.   pantoprazole (PROTONIX) 40 MG tablet Take 1  tablet by mouth once a day   phentermine 37.5 MG capsule Take 1 capsule by mouth every morning.   tiZANidine (ZANAFLEX) 4 MG tablet TAKE 1 TABLET BY MOUTH TWICE A DAY WITH FOOD AS NEEDED FOR SPASMS   zolpidem (AMBIEN) 10 MG tablet ake 1/2 to 1 tablet by mouth 30 minutes before bedtime as needed for insomnia (Patient not taking: Reported on 11/19/2021)   [DISCONTINUED] atorvastatin (LIPITOR) 20 MG tablet Take 20 mg by mouth daily.   [DISCONTINUED] Crisaborole (EUCRISA) 2 % OINT Apply to the affected area two times daily   [DISCONTINUED] ergocalciferol (VITAMIN D2) 1.25 MG (50000 UT) capsule Take 1 capsule by mouth once a week.   [DISCONTINUED] ergocalciferol (VITAMIN D2) 1.25 MG (50000 UT) capsule Take 1 capsule by mouth once a week   [DISCONTINUED] latanoprost (XALATAN) 0.005 % ophthalmic solution Place 1 drop into both eyes at bedtime.   [DISCONTINUED] Liraglutide -Weight Management (SAXENDA) 18 MG/3ML SOPN Inject 0.6 mg into the skin daily for 7 days,1.2 mg daily for 7 days,1.8 mg daily for 7 days,2.4 mg daily for 7 days then 3 mg daily for 7 days   [DISCONTINUED] Liraglutide -Weight Management (SAXENDA) 18 MG/3ML SOPN Inject 3 mg under the skin daily   [DISCONTINUED] Liraglutide -Weight Management (SAXENDA) 18 MG/3ML SOPN Inject 3 mg subcutaneously daily.   [DISCONTINUED] phentermine 15 MG capsule Take 1 (one) Capsule by mouth once daily   [DISCONTINUED] phentermine 30 MG capsule Take 1  capsule (30 mg total) by mouth daily.   [DISCONTINUED] phentermine 37.5 MG capsule Take 37.5 mg by mouth every morning.   [DISCONTINUED] phentermine 37.5 MG capsule Take 1 capsule by mouth daily.   [DISCONTINUED] pravastatin (PRAVACHOL) 10 MG tablet Take 10 mg by mouth at bedtime.   [DISCONTINUED] SAXENDA 18 MG/3ML SOPN Inject 3 mg into the skin daily.    [DISCONTINUED] Vitamin D, Ergocalciferol, (DRISDOL) 1.25 MG (50000 UNIT) CAPS capsule Take 1 capsule by mouth once a week   [DISCONTINUED] Vitamin D,  Ergocalciferol, (DRISDOL) 1.25 MG (50000 UNIT) CAPS capsule Take 1 capsule by mouth once a week.   [DISCONTINUED] Vitamin D, Ergocalciferol, (DRISDOL) 50000 UNITS CAPS capsule Take 50,000 Units by mouth every 7 (seven) days. Monday   Crisaborole (EUCRISA) 2 % OINT Apply to the affected area two times daily   linaclotide (LINZESS) 145 MCG CAPS capsule TAKE 1 CAPSULE BY MOUTH AT LEAST 30 MINUTES BEFORE THE FIRST MEAL OF THE DAY ON AN EMPTY STOMACH   topiramate (TOPAMAX) 50 MG tablet TAIE ONE TABLET BY MOUTH AT BEDTIME   topiramate (TOPAMAX) 50 MG tablet TAKE 1 TABLET BY MOUTH AT BEDTIME   [DISCONTINUED] atorvastatin (LIPITOR) 20 MG tablet TAKE 1 TABLET BY MOUTH ONCE DAILY   [DISCONTINUED] phentermine (ADIPEX-P) 37.5 MG tablet TAKE 1 TABLET BY MOUTH ONCE A DAY   [DISCONTINUED] phentermine (ADIPEX-P) 37.5 MG tablet TAKE 1 TABLET BY MOUTH DAILY   [DISCONTINUED] phentermine (ADIPEX-P) 37.5 MG tablet TAKE 1 TABLET BY MOUTH DAILY   [DISCONTINUED] phentermine (ADIPEX-P) 37.5 MG tablet TAKE 1 TABLET BY MOUTH DAILY   [DISCONTINUED] triamcinolone cream (KENALOG) 0.5 %    No facility-administered encounter medications on file as of 11/19/2021.    Past Medical History:  Diagnosis Date   Arthritis    SVD (spontaneous vaginal delivery)    x 3    Past Surgical History:  Procedure Laterality Date   CHOLECYSTECTOMY     DILITATION & CURRETTAGE/HYSTROSCOPY WITH NOVASURE ABLATION N/A 07/25/2013   Procedure: DILATATION & CURETTAGE/HYSTEROSCOPY WITH NOVASURE ABLATION;  Surgeon: Miguel Aschoff, MD;  Location: [facility];  Service: Gynecology;  Laterality: N/A;   TONSILLECTOMY     TOTAL HIP ARTHROPLASTY Right 03/24/2020   Procedure: RIGHT TOTAL HIP ARTHROPLASTY ANTERIOR APPROACH;  Surgeon: Marcene Corning, MD;  Location: [facility];  Service: Orthopedics;  Laterality: Right;   TUBAL LIGATION     WISDOM TOOTH EXTRACTION      Family History  Problem Relation Age of Onset   Heart failure Mother    Heart attack Father      Social History   Socioeconomic History   Marital status: Single    Spouse name: Not on file   Number of children: Not on file   Years of education: Not on file   Highest education level: Not on file  Occupational History   Not on file  Tobacco Use   Smoking status: Never   Smokeless tobacco: Never  Vaping Use   Vaping Use: Never used  Substance and Sexual Activity   Alcohol use: Yes    Comment: socially   Drug use: No   Sexual activity: Yes    Birth control/protection: Surgical  Other Topics Concern   Not on file  Social History Narrative   Not on file   Social Determinants of Health   Financial Resource Strain: Not on file  Food Insecurity: Not on file  Transportation Needs: Not on file  Physical Activity: Not on file  Stress: Not on file  Social Connections: Not on file  Intimate Partner Violence: Not on file    Review of Systems  All other systems reviewed and are negative.       Objective    BP 118/80   Pulse 75   Temp 98.1 F (36.7 C) (Oral)   Resp 16   Ht 5\' 4"  (1.626 m)   Wt 255 lb (115.7 kg)   SpO2 98%   BMI 43.77 kg/m   Physical Exam Vitals and nursing note reviewed.  Constitutional:      General: She is not in acute distress.    Appearance: She is obese.  Cardiovascular:     Rate and Rhythm: Normal rate and regular rhythm.  Pulmonary:     Effort: Pulmonary effort is normal.     Breath sounds: Normal breath sounds.  Chest:     Comments: Large pendulous breast bilaterally Abdominal:     Palpations: Abdomen is soft.     Tenderness: There is no abdominal tenderness.  Neurological:     General: No focal deficit present.     Mental Status: She is alert and oriented to person, place, and time.         Assessment & Plan:   1. Class 3 severe obesity due to excess calories without serious comorbidity with body mass index (BMI) of 40.0 to 44.9 in adult [facility]) Patient had previous bariatric surgery and has been gaining weight as  of late. Saxenda and phentermine prescribed.   2. Macromastia Patient to follow up with consultant who is performing skin removal surgery  3. Insomnia, unspecified type Discussed sleep hygiene  4. Eczema, unspecified type Eucrysa refilled  5. Encounter to establish care     Return in about 4 weeks (around 12/17/2021).   12/19/2021, MD

## 2021-11-21 NOTE — Telephone Encounter (Signed)
Liraglutide-Weight Management ordered 11/19/2021. Call patient to determine medical necessity of early refill request.

## 2021-11-23 ENCOUNTER — Other Ambulatory Visit (HOSPITAL_COMMUNITY): Payer: Self-pay

## 2021-11-24 ENCOUNTER — Other Ambulatory Visit (HOSPITAL_COMMUNITY): Payer: Self-pay

## 2021-11-26 ENCOUNTER — Ambulatory Visit: Payer: BC Managed Care – PPO | Admitting: Plastic Surgery

## 2021-11-27 ENCOUNTER — Encounter (HOSPITAL_COMMUNITY): Payer: Self-pay

## 2021-11-27 ENCOUNTER — Other Ambulatory Visit (HOSPITAL_COMMUNITY): Payer: Self-pay

## 2021-11-27 MED ORDER — PHENTERMINE HCL 37.5 MG PO CAPS
37.5000 mg | ORAL_CAPSULE | Freq: Every day | ORAL | 0 refills | Status: DC
  Filled 2021-11-27 – 2022-01-12 (×2): qty 30, 30d supply, fill #0

## 2021-11-27 MED ORDER — SAXENDA 18 MG/3ML ~~LOC~~ SOPN
PEN_INJECTOR | SUBCUTANEOUS | 1 refills | Status: DC
  Filled 2021-11-27: qty 15, 30d supply, fill #0
  Filled 2022-01-12: qty 15, 30d supply, fill #1

## 2021-11-27 MED ORDER — EUCRISA 2 % EX OINT
TOPICAL_OINTMENT | CUTANEOUS | 1 refills | Status: DC
Start: 2021-11-26 — End: 2021-12-14
  Filled 2021-11-27: qty 60, 30d supply, fill #0

## 2021-11-30 NOTE — Therapy (Signed)
OUTPATIENT PHYSICAL THERAPY THORACOLUMBAR EVALUATION   Patient Name: Sandra Bird MRN: [id number] DOB:Dec 03, 1970, 51 y.o., female Today's Date: 12/01/2021   PT End of Session - 12/01/21 1440     Visit Number 1    Date for PT Re-Evaluation 01/26/22    Authorization Type BCBS    PT Start Time 1353    PT Stop Time 1431    PT Time Calculation (min) 38 min    Activity Tolerance Patient tolerated treatment well    Behavior During Therapy WFL for tasks assessed/performed             Past Medical History:  Diagnosis Date   Arthritis    SVD (spontaneous vaginal delivery)    x 3   Past Surgical History:  Procedure Laterality Date   CHOLECYSTECTOMY     DILITATION & CURRETTAGE/HYSTROSCOPY WITH NOVASURE ABLATION N/A 07/25/2013   Procedure: DILATATION & CURETTAGE/HYSTEROSCOPY WITH NOVASURE ABLATION;  Surgeon: Miguel Aschoff, MD;  Location: [facility];  Service: Gynecology;  Laterality: N/A;   TONSILLECTOMY     TOTAL HIP ARTHROPLASTY Right 03/24/2020   Procedure: RIGHT TOTAL HIP ARTHROPLASTY ANTERIOR APPROACH;  Surgeon: Marcene Corning, MD;  Location: [facility];  Service: Orthopedics;  Laterality: Right;   TUBAL LIGATION     WISDOM TOOTH EXTRACTION     Patient Active Problem List   Diagnosis Date Noted   Hypercholesteremia 11/19/2021   OA (osteoarthritis) 11/19/2021   Microscopic hematuria 09/08/2021   Hyperlipidemia 12/09/2020   Menometrorrhagia 12/09/2020   Tinea cruris 12/09/2020   S/P endometrial ablation 07/21/2020   Suspected sleep apnea 07/16/2020   Primary localized osteoarthritis of right hip 03/24/2020   Primary osteoarthritis of right hip 03/24/2020   Osteoarthritis of right hip 03/24/2020   Hypercholesterolemia 07/12/2018   IFG (impaired fasting glucose) 08/12/2012   Morbid (severe) obesity due to excess calories (HCC) 06/12/2012   OBSTRUCTIVE SLEEP APNEA 05/27/2009    PCP: Georganna Skeans, MD   REFERRING PROVIDER: Janne Napoleon, MD  REFERRING DIAG: Panniculitis,  possible breast reduction  Rationale for Evaluation and Treatment Rehabilitation  THERAPY DIAG:  Pain in thoracic spine - Plan: PT plan of care cert/re-cert  Other low back pain - Plan: PT plan of care cert/re-cert  Cramp and spasm - Plan: PT plan of care cert/re-cert  Muscle weakness (generalized) - Plan: PT plan of care cert/re-cert  ONSET DATE: chronic LBP and thoracic pain (>5 years)   SUBJECTIVE:  SUBJECTIVE STATEMENT: Pt presents to PT with chronic history of thoracic and lumbar pain. Pt is working with a Careers adviser to begin steps for a breast reduction Careers adviser.   PERTINENT HISTORY:  Rt total hip replacement (2021)  PAIN:  Are you having pain? Yes: NPRS scale: 4-8/10 Pain location: bil low back and thoracic spine Pain description: sore, aching, dull, shooting  Aggravating factors: morning hours, standing, bending, sleep (waking 2x/night)  Relieving factors: Aleve, heat/ice  PRECAUTIONS: None  WEIGHT BEARING RESTRICTIONS No  FALLS:  Has patient fallen in last 6 months? No  LIVING ENVIRONMENT: Lives with: lives with their family Lives in: House/apartment  OCCUPATION: retired  PLOF: Independent  PATIENT GOALS reduce pain in back and thoracic spine, walking, exercise   OBJECTIVE: 12/01/21  DIAGNOSTIC FINDINGS:  None   PATIENT SURVEYS:  FOTO 24 (goal is 44)  SCREENING FOR RED FLAGS: Bowel or bladder incontinence: No Spinal tumors: No Cauda equina syndrome: No Compression fracture: No Abdominal aneurysm: No  COGNITION:  Overall cognitive status: Within functional limits for tasks assessed     SENSATION: WFL   POSTURE: rounded shoulders, forward head, and flexed trunk   PALPATION: pt with diffuse and significant palpable tenderness over bil thoracic/lumbar paraspinals,  gluteals and rhomboids.  Reduced PA mobility in the lumbar and thoracic spine with pain.    LUMBAR ROM:  Limited by 25% in all directions with pain in lumbar/thoracic spine.  LOWER EXTREMITY ROM:     Hip flexibility limited by 25% with significant gluteal pain Rt>Lt with passive testing  LOWER EXTREMITY MMT:    MMT Right eval Left eval  Hip flexion 3+ 4-  Hip extension 4- 4  Hip abduction 4- 4  Hip adduction    Hip internal rotation    Hip external rotation    Knee flexion 4 4+  Knee extension 4 4+  Ankle dorsiflexion 4 4  Ankle plantarflexion    Ankle inversion    Ankle eversion     (Blank rows = not tested)    GAIT: Distance walked: 50 Assistive device utilized: None Level of assistance: Complete Independence Comments: No gait deviations   TODAY'S TREATMENT  Date: 12/01/21 HEP established-see below    PATIENT EDUCATION:  Education details: Access Code: [email or web address], DN info Person educated: Patient Education method: Explanation, Demonstration, and Handouts Education comprehension: verbalized understanding and returned demonstration   HOME EXERCISE PROGRAM: Access Code: [email or web address] URL: [email or web address] Date: 12/01/2021 Prepared by: Tresa Endo  Exercises - Supine Lower Trunk Rotation  - 1 x daily - 7 x weekly - 1 sets - 3 reps - 20 hold - Hooklying Single Knee to Chest  - 3 x daily - 7 x weekly - 1 sets - 3 reps - 20 hold - Seated Hamstring Stretch  - 3 x daily - 7 x weekly - 1 sets - 3 reps - 20 hold - Sidelying Open Book Thoracic Lumbar Rotation and Extension  - 2-3 x daily - 7 x weekly - 1 sets - 10 reps - Seated Figure 4 Piriformis Stretch  - 2-3 x daily - 7 x weekly - 1 sets - 3 reps - 20 hold  ASSESSMENT:  CLINICAL IMPRESSION: Patient is a 51 y.o. female who was seen today for physical therapy evaluation and treatment for chronic back pain.  Pt reports a chronic history of thoracic and lumbar pain and has not received treatment for this in  the past.  Pt rates pain as 4-8/10 that is constant in the thoracic and lumbar  spine.  Pt is worse with standing and walking and limits these activities to 10 min or less due to pain.  Pt is not able to walk or exercise due to pain.  Pt also reports significant pain in the morning hours. Pt with painful and limited lumbar and thoracic A/ROM, hip flexibility is limited and pt with diffuse and significant palpable tenderness over bil thoracic/lumbar paraspinals, gluteals and rhomboids. Significant LE weakness in hips and knees.  Patient will benefit from skilled PT to address the below impairments and improve overall function.    OBJECTIVE IMPAIRMENTS decreased activity tolerance, difficulty walking, decreased strength, impaired perceived functional ability, increased muscle spasms, impaired flexibility, improper body mechanics, postural dysfunction, and pain.   ACTIVITY LIMITATIONS carrying, lifting, bending, sitting, standing, sleeping, and transfers  PARTICIPATION LIMITATIONS: meal prep, cleaning, laundry, shopping, community activity, and yard work  PERSONAL FACTORS Time since onset of injury/illness/exacerbation and 1 comorbidity: Rt THA in 2021, chronic LBP  are also affecting patient's functional outcome.   REHAB POTENTIAL: Good  CLINICAL DECISION MAKING: Stable/uncomplicated  EVALUATION COMPLEXITY: Low   GOALS: Goals reviewed with patient? Yes  SHORT TERM GOALS: Target date: 12/29/2021    Be independent in initial HEP Baseline: Goal status: INITIAL  2.  Report > or  to 30% reduction in thoracic/lumbar pain with self-care and ADLs  Baseline: constant 4-8/10 Goal status: INITIAL  3.  Verbalize and demonstrate body mechanics modifications for lumbar protection with daily tasks  Baseline:  Goal status: INITIAL  4.  Wake with pain <or = to 1x/night  Baseline: waking 2x/night with pain Goal status: INITIAL  LONG TERM GOALS: Target date: 01/26/2022  Be independent in advanced  HEP Baseline:  Goal status: INITIAL  2.  Reduce FOTO to < or = to 44 Baseline:  Goal status: INITIAL  3.  Reduce back pain to stand > or = to 15-20 minutes for ADLs and community tasks without limitation Baseline: 10 min max  Goal status: INITIAL  4.  Wake with pain < or = to 3 nights/wk Baseline: waking 2x/night  Goal status: INITIAL  5.  Report > or = to 60% reduction in back pain with ADLs and community tasks  Baseline:  Goal status: INITIAL  6.  Demonstrate > or = to 4 to 4+/5 LE strength to improve endurance for standing tasks  Goal status: Initial PLAN: PT FREQUENCY: 1-2x/week  PT DURATION: 8 weeks  PLANNED INTERVENTIONS: Therapeutic exercises, Therapeutic activity, Neuromuscular re-education, Balance training, Gait training, Patient/Family education, Self Care, Joint mobilization, Aquatic Therapy, Dry Needling, Electrical stimulation, Spinal manipulation, Spinal mobilization, Cryotherapy, Moist heat, Taping, Traction, Ultrasound, and Manual therapy.  PLAN FOR NEXT SESSION: postural strength, thoracolumbar mobility, DN to lumbar and thoracic   Lorrene Reid, PT 12/01/21 2:41 PM   [facility] [address] [place], [place] [address] Phone # [phone] Fax [phone]

## 2021-12-01 ENCOUNTER — Ambulatory Visit: Payer: BC Managed Care – PPO | Attending: Plastic Surgery

## 2021-12-01 ENCOUNTER — Other Ambulatory Visit: Payer: Self-pay

## 2021-12-01 DIAGNOSIS — M546 Pain in thoracic spine: Secondary | ICD-10-CM | POA: Diagnosis present

## 2021-12-01 DIAGNOSIS — M5459 Other low back pain: Secondary | ICD-10-CM | POA: Diagnosis present

## 2021-12-01 DIAGNOSIS — M793 Panniculitis, unspecified: Secondary | ICD-10-CM | POA: Diagnosis not present

## 2021-12-01 DIAGNOSIS — R252 Cramp and spasm: Secondary | ICD-10-CM | POA: Insufficient documentation

## 2021-12-01 DIAGNOSIS — M6281 Muscle weakness (generalized): Secondary | ICD-10-CM | POA: Insufficient documentation

## 2021-12-01 NOTE — Patient Instructions (Signed)

## 2021-12-02 ENCOUNTER — Telehealth: Payer: Self-pay

## 2021-12-02 ENCOUNTER — Ambulatory Visit: Payer: BC Managed Care – PPO

## 2021-12-02 DIAGNOSIS — M546 Pain in thoracic spine: Secondary | ICD-10-CM | POA: Diagnosis not present

## 2021-12-02 DIAGNOSIS — M5459 Other low back pain: Secondary | ICD-10-CM

## 2021-12-02 DIAGNOSIS — R252 Cramp and spasm: Secondary | ICD-10-CM

## 2021-12-02 DIAGNOSIS — M6281 Muscle weakness (generalized): Secondary | ICD-10-CM

## 2021-12-02 NOTE — Telephone Encounter (Signed)
error 

## 2021-12-02 NOTE — Therapy (Signed)
OUTPATIENT PHYSICAL THERAPY THORACOLUMBAR EVALUATION   Patient Name: Sandra Bird MRN: [id number] DOB:25-Dec-1970, 51 y.o., female Today's Date: 12/02/2021   PT End of Session - 12/02/21 0931     Visit Number 2    Date for PT Re-Evaluation 01/26/22    Authorization Type BCBS    PT Start Time [phone]    PT Stop Time 0932    PT Time Calculation (min) 43 min    Activity Tolerance Patient tolerated treatment well    Behavior During Therapy [facility] for tasks assessed/performed              Past Medical History:  Diagnosis Date   Arthritis    SVD (spontaneous vaginal delivery)    x 3   Past Surgical History:  Procedure Laterality Date   CHOLECYSTECTOMY     DILITATION & CURRETTAGE/HYSTROSCOPY WITH NOVASURE ABLATION N/A 07/25/2013   Procedure: DILATATION & CURETTAGE/HYSTEROSCOPY WITH NOVASURE ABLATION;  Surgeon: Miguel Aschoff, MD;  Location: [facility];  Service: Gynecology;  Laterality: N/A;   TONSILLECTOMY     TOTAL HIP ARTHROPLASTY Right 03/24/2020   Procedure: RIGHT TOTAL HIP ARTHROPLASTY ANTERIOR APPROACH;  Surgeon: Marcene Corning, MD;  Location: [facility];  Service: Orthopedics;  Laterality: Right;   TUBAL LIGATION     WISDOM TOOTH EXTRACTION     Patient Active Problem List   Diagnosis Date Noted   Hypercholesteremia 11/19/2021   OA (osteoarthritis) 11/19/2021   Microscopic hematuria 09/08/2021   Hyperlipidemia 12/09/2020   Menometrorrhagia 12/09/2020   Tinea cruris 12/09/2020   S/P endometrial ablation 07/21/2020   Suspected sleep apnea 07/16/2020   Primary localized osteoarthritis of right hip 03/24/2020   Primary osteoarthritis of right hip 03/24/2020   Osteoarthritis of right hip 03/24/2020   Hypercholesterolemia 07/12/2018   IFG (impaired fasting glucose) 08/12/2012   Morbid (severe) obesity due to excess calories (HCC) 06/12/2012   OBSTRUCTIVE SLEEP APNEA 05/27/2009    PCP: Georganna Skeans, MD   REFERRING PROVIDER: Janne Napoleon, MD  REFERRING DIAG:  Panniculitis, possible breast reduction  Rationale for Evaluation and Treatment Rehabilitation  THERAPY DIAG:  Pain in thoracic spine  Other low back pain  Cramp and spasm  Muscle weakness (generalized)  ONSET DATE: chronic LBP and thoracic pain (>5 years)   SUBJECTIVE:                                                                                                                                                                                           SUBJECTIVE STATEMENT: I did my exercises.  I'm sore because of the rain today.  PERTINENT HISTORY:  Rt total hip replacement (2021)  PAIN:  Are you having pain? Yes: NPRS scale: 6/10 Pain location: bil low back and thoracic spine Pain description: sore, aching, dull, shooting  Aggravating factors: morning hours, standing, bending, sleep (waking 2x/night)  Relieving factors: Aleve, heat/ice  PRECAUTIONS: None  WEIGHT BEARING RESTRICTIONS No  FALLS:  Has patient fallen in last 6 months? No  LIVING ENVIRONMENT: Lives with: lives with their family Lives in: House/apartment  OCCUPATION: retired  PLOF: Independent  PATIENT GOALS reduce pain in back and thoracic spine, walking, exercise   OBJECTIVE: 12/01/21  DIAGNOSTIC FINDINGS:  None   PATIENT SURVEYS:  FOTO 24 (goal is 44)  SCREENING FOR RED FLAGS: Bowel or bladder incontinence: No Spinal tumors: No Cauda equina syndrome: No Compression fracture: No Abdominal aneurysm: No  COGNITION:  Overall cognitive status: Within functional limits for tasks assessed     SENSATION: WFL   POSTURE: rounded shoulders, forward head, and flexed trunk   PALPATION: pt with diffuse and significant palpable tenderness over bil thoracic/lumbar paraspinals, gluteals and rhomboids.  Reduced PA mobility in the lumbar and thoracic spine with pain.    LUMBAR ROM:  Limited by 25% in all directions with pain in lumbar/thoracic spine.  LOWER EXTREMITY ROM:     Hip flexibility  limited by 25% with significant gluteal pain Rt>Lt with passive testing  LOWER EXTREMITY MMT:    MMT Right eval Left eval  Hip flexion 3+ 4-  Hip extension 4- 4  Hip abduction 4- 4  Hip adduction    Hip internal rotation    Hip external rotation    Knee flexion 4 4+  Knee extension 4 4+  Ankle dorsiflexion 4 4  Ankle plantarflexion    Ankle inversion    Ankle eversion     (Blank rows = not tested)    GAIT: Distance walked: 50 Assistive device utilized: None Level of assistance: Complete Independence Comments: No gait deviations   TODAY'S TREATMENT  Date: 12/02/21  Hamstring stretch 2x20 seconds  Seated piriformis stretch: 2x20 seconds Open book x10 Knee to chest 2x20 seconds  Trunk rotation 2x20 seconds Trigger Point Dry-Needling  Treatment instructions: Expect mild to moderate muscle soreness. S/S of pneumothorax if dry needled over a lung field, and to seek immediate medical attention should they occur. Patient verbalized understanding of these instructions and education.  Patient Consent Given: Yes Education handout provided: Previously provided Muscles treated: Bil thoracic and lumbar multifidi, bil gluteals Treatment response/outcome: Utilized skilled palpation to identify trigger points.  During dry needling able to palpate muscle twitch and muscle elongation  Elongation and release to bil lumbar and thoracic spine  Skilled palpation and monitoring by PT during dry needling   Date: 12/01/21 HEP established-see below    PATIENT EDUCATION:  Education details: Access Code: [email or web address], DN info Person educated: Patient Education method: Explanation, Demonstration, and Handouts Education comprehension: verbalized understanding and returned demonstration   HOME EXERCISE PROGRAM: Access Code: [email or web address] URL: [email or web address] Date: 12/01/2021 Prepared by: Tresa Endo  Exercises - Supine Lower Trunk Rotation  - 1 x daily - 7 x weekly - 1 sets - 3 reps  - 20 hold - Hooklying Single Knee to Chest  - 3 x daily - 7 x weekly - 1 sets - 3 reps - 20 hold - Seated Hamstring Stretch  - 3 x daily - 7 x weekly - 1 sets - 3 reps - 20 hold - Sidelying Open Book Thoracic Lumbar Rotation and Extension  - 2-3 x daily - 7 x weekly - 1 sets -  10 reps - Seated Figure 4 Piriformis Stretch  - 2-3 x daily - 7 x weekly - 1 sets - 3 reps - 20 hold  ASSESSMENT:  CLINICAL IMPRESSION: First time follow-up after evaluation yesterday.  Pt is doing well with initial flexibility exercises and required minor cues to relax shoulder when performing seated stretches.  Pt with tension and trigger points in the thoracic and lumbar spine and had good response to DN and manual therapy today with reduced pain and improved tissue mobility.  Patient will benefit from skilled PT to address the below impairments and improve overall function.    OBJECTIVE IMPAIRMENTS decreased activity tolerance, difficulty walking, decreased strength, impaired perceived functional ability, increased muscle spasms, impaired flexibility, improper body mechanics, postural dysfunction, and pain.   ACTIVITY LIMITATIONS carrying, lifting, bending, sitting, standing, sleeping, and transfers  PARTICIPATION LIMITATIONS: meal prep, cleaning, laundry, shopping, community activity, and yard work  PERSONAL FACTORS Time since onset of injury/illness/exacerbation and 1 comorbidity: Rt THA in 2021, chronic LBP  are also affecting patient's functional outcome.   REHAB POTENTIAL: Good  CLINICAL DECISION MAKING: Stable/uncomplicated  EVALUATION COMPLEXITY: Low   GOALS: Goals reviewed with patient? Yes  SHORT TERM GOALS: Target date: 12/29/2021    Be independent in initial HEP Baseline: Goal status: INITIAL  2.  Report > or  to 30% reduction in thoracic/lumbar pain with self-care and ADLs  Baseline: constant 4-8/10 Goal status: INITIAL  3.  Verbalize and demonstrate body mechanics modifications for lumbar  protection with daily tasks  Baseline:  Goal status: INITIAL  4.  Wake with pain <or = to 1x/night  Baseline: waking 2x/night with pain Goal status: INITIAL  LONG TERM GOALS: Target date: 01/26/2022  Be independent in advanced HEP Baseline:  Goal status: INITIAL  2.  Reduce FOTO to < or = to 44 Baseline:  Goal status: INITIAL  3.  Reduce back pain to stand > or = to 15-20 minutes for ADLs and community tasks without limitation Baseline: 10 min max  Goal status: INITIAL  4.  Wake with pain < or = to 3 nights/wk Baseline: waking 2x/night  Goal status: INITIAL  5.  Report > or = to 60% reduction in back pain with ADLs and community tasks  Baseline:  Goal status: INITIAL  6.  Demonstrate > or = to 4 to 4+/5 LE strength to improve endurance for standing tasks  Goal status: Initial PLAN: PT FREQUENCY: 1-2x/week  PT DURATION: 8 weeks  PLANNED INTERVENTIONS: Therapeutic exercises, Therapeutic activity, Neuromuscular re-education, Balance training, Gait training, Patient/Family education, Self Care, Joint mobilization, Aquatic Therapy, Dry Needling, Electrical stimulation, Spinal manipulation, Spinal mobilization, Cryotherapy, Moist heat, Taping, Traction, Ultrasound, and Manual therapy.  PLAN FOR NEXT SESSION: postural strength, thoracolumbar mobility, DN to lumbar and thoracic   Lorrene Reid, PT 12/02/21 9:45 AM   [facility] [address] [place], [place] [address] Phone # [phone] Fax [phone]

## 2021-12-03 ENCOUNTER — Telehealth: Payer: Self-pay | Admitting: *Deleted

## 2021-12-03 NOTE — Telephone Encounter (Signed)
Return call to pt in response to message left inquiring about amount she needs to pay before surgery. Advised her we would collect the surgeon's fee of $2100 at her pre-op appointment and the OR/Anesthesia fees on her quote would be collected by [facility]. Pt verbalized understanding.

## 2021-12-06 ENCOUNTER — Encounter: Payer: Self-pay | Admitting: Physician Assistant

## 2021-12-06 ENCOUNTER — Ambulatory Visit (INDEPENDENT_AMBULATORY_CARE_PROVIDER_SITE_OTHER): Payer: BC Managed Care – PPO | Admitting: Physician Assistant

## 2021-12-06 ENCOUNTER — Other Ambulatory Visit (HOSPITAL_COMMUNITY): Payer: Self-pay

## 2021-12-06 VITALS — BP 140/74 | HR 95 | Ht 64.0 in | Wt 246.0 lb

## 2021-12-06 DIAGNOSIS — M793 Panniculitis, unspecified: Secondary | ICD-10-CM

## 2021-12-06 MED ORDER — OXYCODONE HCL 5 MG PO TABS
5.0000 mg | ORAL_TABLET | Freq: Four times a day (QID) | ORAL | 0 refills | Status: AC | PRN
  Filled 2021-12-06: qty 20, 5d supply, fill #0

## 2021-12-06 MED ORDER — ONDANSETRON 4 MG PO TBDP
4.0000 mg | ORAL_TABLET | Freq: Three times a day (TID) | ORAL | 0 refills | Status: DC | PRN
  Filled 2021-12-06: qty 18, 21d supply, fill #0

## 2021-12-06 NOTE — Progress Notes (Signed)
Patient ID: Sandra Bird, female    DOB: 11-04-1970, 51 y.o.   MRN: [id number]  Chief Complaint  Patient presents with   Pre-op Exam      ICD-10-CM   1. Panniculitis  M79.3        History of Present Illness: Sandra Bird is a 51 y.o.  female  with a history of panniculitis.  She presents for preoperative evaluation for upcoming procedure, panniculectomy with upper abdominal liposuction, plication, and transposition of the umbilicus, scheduled for 12/23/2021 with Dr.  Erin Hearing .  The patient has not had problems with anesthesia.  She denies any personal or family history of blood clots or clotting disorder.  Denies any personal history of cancer, CVA, MI, or other serious cardiopulmonary disease.  She does take aspirin 81 mg simply as a preventative measure, but will hold 1 week prior to surgery.  She will also hold her Saxenda, phentermine, and vitamins/supplements 1 week prior to surgery.  She takes Celebrex for arthritic pain which she will hold 3 days prior to surgery.  Discussed expectations regarding surgery as well as postoperative recovery.  She has already obtained a husband pillow and postoperative compressive garments.  She no longer takes her Ambien because she feels as though it did not help.  Summary of Previous Visit: Patient initially met with Dr. 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  She endorsed history of extreme weight loss contributing to her large overhanging pannus.  Discussed panniculectomy, but patient also expressed interest in upper abdominal work.  Returned on 11/19/2021 for additional discussion and plan is for panniculectomy with upper abdominal liposuction and plication with transposition of umbilicus.  Discussed possible staged procedure with liposuction of her back and abdomen subsequent to her abdominoplasty surgery.  Job: Retired Pharmacist, hospital.  PMH Significant for:  Recurrent panniculitis, HLD OSA, obesity, GERD, obesity on phentermine and Saxenda.   Past Medical History: Allergies: Allergies  Allergen Reactions   Penicillins Rash    Has patient had a PCN reaction causing immediate rash, facial/tongue/throat swelling, SOB or lightheadedness with hypotension: Yes Has patient had a PCN reaction causing severe rash involving mucus membranes or skin necrosis: No Has patient had a PCN reaction that required hospitalization No Has patient had a PCN reaction occurring within the last 10 years: No If all of the above answers are "NO", then may proceed with Cephalosporin use.     Current Medications:  Current Outpatient Medications:    aspirin EC 81 MG tablet, Take 1 tablet (81 mg total) by mouth 2 (two) times daily after a meal. Swallow whole., Disp: 60 tablet, Rfl: 0   Capsaicin-Menthol-Methyl Sal (DENDRACIN NEURODENDRAXCIN) 0.025-10-30 % LOTN, APPLY SMALL AMOUNT TO THE AFFECTED AREA UP TO THREE TIMES DAILY AS NEEDED FOR PAIN - DO NOT USE THE SAME TIME AS PENNSAID, Disp: 120 mL, Rfl: 0   celecoxib (CELEBREX) 200 MG capsule, Take 1 capsule by mouth once a day with food as needed for pain, Disp: 90 capsule, Rfl: 1   clobetasol cream (TEMOVATE) 0.05 %, Apply to affected area two times daily, Disp: 15 g, Rfl: 1   clotrimazole-betamethasone (LOTRISONE) cream, APPLY A THIN COAT DAILY AS NEEDED, Disp: 45 g, Rfl: 2   Crisaborole (EUCRISA) 2 % OINT, Use 1 (one) Application two times daily, Disp: 60 g, Rfl: 1   Diclofenac Sodium 1.5 % SOLN, APPLY SMALL AMOUNT (ABOUT 10 DROPS) TO THE AFFECTED AREA TWICE DAILY  AS NEEDED FOR PAIN - DO NOT USE THE SAME TIME AS DENDRACIN, Disp: 150 mL, Rfl: 0   ergocalciferol (VITAMIN D2) 1.25 MG (50000 UT) capsule, Take 1 capsule (50,000 Units total) by mouth once a week., Disp: 12 capsule, Rfl: 0   latanoprost (XALATAN) 0.005 % ophthalmic solution, Place 1 drop into both eyes every evening, Disp: 7.5 mL, Rfl: 3   Liraglutide -Weight  Management (SAXENDA) 18 MG/3ML SOPN, Inject 3 mg under the skin daily, Disp: 15 mL, Rfl: 1   Multiple Vitamin (MULTIVITAMIN WITH MINERALS) TABS tablet, Take 1 tablet by mouth daily., Disp: , Rfl:    pantoprazole (PROTONIX) 40 MG tablet, Take 1 tablet by mouth once a day, Disp: 30 tablet, Rfl: 5   phentermine 37.5 MG capsule, Take 1 (one) Capsule by mouth daily, Disp: 30 capsule, Rfl: 0   linaclotide (LINZESS) 145 MCG CAPS capsule, TAKE 1 CAPSULE BY MOUTH AT LEAST 30 MINUTES BEFORE THE FIRST MEAL OF THE DAY ON AN EMPTY STOMACH, Disp: 30 capsule, Rfl: 3  Past Medical Problems: Past Medical History:  Diagnosis Date   Arthritis    SVD (spontaneous vaginal delivery)    x 3    Past Surgical History: Past Surgical History:  Procedure Laterality Date   CHOLECYSTECTOMY     DILITATION & CURRETTAGE/HYSTROSCOPY WITH NOVASURE ABLATION N/A 07/25/2013   Procedure: DILATATION & CURETTAGE/HYSTEROSCOPY WITH NOVASURE ABLATION;  Surgeon: Gus Height, MD;  Location: [facility];  Service: Gynecology;  Laterality: N/A;   TONSILLECTOMY     TOTAL HIP ARTHROPLASTY Right 03/24/2020   Procedure: RIGHT TOTAL HIP ARTHROPLASTY ANTERIOR APPROACH;  Surgeon: Melrose Nakayama, MD;  Location: [facility];  Service: Orthopedics;  Laterality: Right;   TUBAL LIGATION     WISDOM TOOTH EXTRACTION      Social History: Social History   Socioeconomic History   Marital status: Single    Spouse name: Not on file   Number of children: Not on file   Years of education: Not on file   Highest education level: Not on file  Occupational History   Not on file  Tobacco Use   Smoking status: Never   Smokeless tobacco: Never  Vaping Use   Vaping Use: Never used  Substance and Sexual Activity   Alcohol use: Yes    Comment: socially   Drug use: No   Sexual activity: Yes    Birth control/protection: Surgical  Other Topics Concern   Not on file  Social History Narrative   Not on file   Social Determinants of Health   Financial  Resource Strain: Not on file  Food Insecurity: Not on file  Transportation Needs: Not on file  Physical Activity: Not on file  Stress: Not on file  Social Connections: Not on file  Intimate Partner Violence: Not on file    Family History: Family History  Problem Relation Age of Onset   Heart failure Mother    Heart attack Father     Review of Systems: ROS Denies any recent hospitalization, fevers, chest pain, or difficulty breathing.  Physical Exam: Vital Signs BP (!) 140/74 (BP Location: Left Arm, Patient Position: Sitting, Cuff Size: Large)   Pulse 95   Ht 5' 4"  (1.626 m)   Wt 246 lb (111.6 kg)   SpO2 100%   BMI 42.23 kg/m   Physical Exam Constitutional:      General: Not in acute distress.    Appearance: Normal appearance. Not ill-appearing.  HENT:     Head: Normocephalic and  atraumatic.  Eyes:     Pupils: Pupils are equal, round. Cardiovascular:     Rate and Rhythm: Normal rate.    Pulses: Normal pulses.  Pulmonary:     Effort: No respiratory distress or increased work of breathing.  Speaks in full sentences. Abdominal:     General: Abdomen is flat. No distension.   Musculoskeletal: Normal range of motion. No lower extremity swelling or edema. No varicosities. Skin:    General: Skin is warm and dry.     Findings: No erythema or rash.  Neurological:     Mental Status: Alert and oriented to person, place, and time.  Psychiatric:        Mood and Affect: Mood normal.        Behavior: Behavior normal.    Assessment/Plan: The patient is scheduled for panniculectomy with upper abdominal liposuction, plication, and transposition of umbilicus with Dr. Erin Hearing.  Risks, benefits, and alternatives of procedure discussed, questions answered and consent obtained.    Smoking Status: Denies smoking or use of other nicotine-containing products.  Caprini Score: 5; Risk Factors include: Age, BMI greater than 40, and length of planned surgery. Recommendation for  mechanical prophylaxis. Encourage early ambulation.   Pictures obtained: 09/24/2021  Post-op Rx sent to pharmacy: Oxycodone and Zofran.  Patient was provided with the General Surgical Risk consent document and Pain Medication Agreement prior to their appointment.  They had adequate time to read through the risk consent documents and Pain Medication Agreement. We also discussed them in person together during this preop appointment. All of their questions were answered to their satisfaction.  Recommended calling if they have any further questions.  Risk consent form and Pain Medication Agreement to be scanned into patient's chart.  The risk that can be encountered for this procedure were discussed and include the following but not limited to these: asymmetry, fluid accumulation, firmness of the tissue, skin loss, decrease or no sensation, fat necrosis, bleeding, infection, healing delay.  Deep vein thrombosis, cardiac and pulmonary complications are risks to any procedure.  There are risks of anesthesia, changes to skin sensation and injury to nerves or blood vessels.  The muscle can be temporarily or permanently injured.  You may have an allergic reaction to tape, suture, glue, blood products which can result in skin discoloration, swelling, pain, skin lesions, poor healing.  Any of these can lead to the need for revisonal surgery or stage procedures.  Weight gain and weigh loss can also effect the long term appearance. The results are not guaranteed to last a lifetime.  Future surgery may be required.      Electronically signed by: Krista Blue, PA-C 12/06/2021 11:00 AM

## 2021-12-08 ENCOUNTER — Ambulatory Visit: Payer: BC Managed Care – PPO

## 2021-12-08 DIAGNOSIS — M546 Pain in thoracic spine: Secondary | ICD-10-CM | POA: Diagnosis not present

## 2021-12-08 DIAGNOSIS — M6281 Muscle weakness (generalized): Secondary | ICD-10-CM

## 2021-12-08 DIAGNOSIS — M5459 Other low back pain: Secondary | ICD-10-CM

## 2021-12-08 DIAGNOSIS — R252 Cramp and spasm: Secondary | ICD-10-CM

## 2021-12-08 NOTE — Therapy (Signed)
OUTPATIENT PHYSICAL THERAPY THORACOLUMBAR EVALUATION   Patient Name: Sandra Bird MRN: [id number] DOB:1970-06-16, 51 y.o., female Today's Date: 12/08/2021   PT End of Session - 12/08/21 1404     Visit Number 3    Date for PT Re-Evaluation 01/26/22    Authorization Type BC[employer]    PT Start Time 1405    PT Stop Time 1445    PT Time Calculation (min) 40 min    Activity Tolerance Patient tolerated treatment well    Behavior During Therapy WFL for tasks assessed/performed              Past Medical History:  Diagnosis Date   Arthritis    SVD (spontaneous vaginal delivery)    x 3   Past Surgical History:  Procedure Laterality Date   CHOLECYSTECTOMY     DILITATION & CURRETTAGE/HYSTROSCOPY WITH NOVASURE ABLATION N/A 07/25/2013   Procedure: DILATATION & CURETTAGE/HYSTEROSCOPY WITH NOVASURE ABLATION;  Surgeon: Miguel Aschoff, MD;  Location: [facility];  Service: Gynecology;  Laterality: N/A;   TONSILLECTOMY     TOTAL HIP ARTHROPLASTY Right 03/24/2020   Procedure: RIGHT TOTAL HIP ARTHROPLASTY ANTERIOR APPROACH;  Surgeon: Marcene Corning, MD;  Location: [facility];  Service: Orthopedics;  Laterality: Right;   TUBAL LIGATION     WISDOM TOOTH EXTRACTION     Patient Active Problem List   Diagnosis Date Noted   Hypercholesteremia 11/19/2021   OA (osteoarthritis) 11/19/2021   Microscopic hematuria 09/08/2021   Hyperlipidemia 12/09/2020   Menometrorrhagia 12/09/2020   Tinea cruris 12/09/2020   S/P endometrial ablation 07/21/2020   Suspected sleep apnea 07/16/2020   Primary localized osteoarthritis of right hip 03/24/2020   Primary osteoarthritis of right hip 03/24/2020   Osteoarthritis of right hip 03/24/2020   Hypercholesterolemia 07/12/2018   IFG (impaired fasting glucose) 08/12/2012   Morbid (severe) obesity due to excess calories (HCC) 06/12/2012   OBSTRUCTIVE SLEEP APNEA 05/27/2009    PCP: Georganna Skeans, MD   REFERRING PROVIDER: Janne Napoleon, MD  REFERRING DIAG:  Panniculitis, possible breast reduction  Rationale for Evaluation and Treatment Rehabilitation  THERAPY DIAG:  Pain in thoracic spine  Other low back pain  Cramp and spasm  Muscle weakness (generalized)  ONSET DATE: chronic LBP and thoracic pain (>5 years)   SUBJECTIVE:                                                                                                                                                                                           SUBJECTIVE STATEMENT: I'm doing ok today.  My pain is around 6/10.     PERTINENT HISTORY:  Rt total hip replacement (2021)  PAIN:  Are you having pain? Yes: NPRS scale: 6/10 Pain location: bil low back and thoracic spine Pain description: sore, aching, dull, shooting  Aggravating factors: morning hours, standing, bending, sleep (waking 2x/night)  Relieving factors: Aleve, heat/ice  PRECAUTIONS: None  WEIGHT BEARING RESTRICTIONS No  FALLS:  Has patient fallen in last 6 months? No  LIVING ENVIRONMENT: Lives with: lives with their family Lives in: House/apartment  OCCUPATION: retired  PLOF: Independent  PATIENT GOALS reduce pain in back and thoracic spine, walking, exercise   OBJECTIVE: 12/01/21  DIAGNOSTIC FINDINGS:  None   PATIENT SURVEYS:  FOTO 24 (goal is 44)  SCREENING FOR RED FLAGS: Bowel or bladder incontinence: No Spinal tumors: No Cauda equina syndrome: No Compression fracture: No Abdominal aneurysm: No  COGNITION:  Overall cognitive status: Within functional limits for tasks assessed     SENSATION: WFL   POSTURE: rounded shoulders, forward head, and flexed trunk   PALPATION: pt with diffuse and significant palpable tenderness over bil thoracic/lumbar paraspinals, gluteals and rhomboids.  Reduced PA mobility in the lumbar and thoracic spine with pain.    LUMBAR ROM:  Limited by 25% in all directions with pain in lumbar/thoracic spine.  LOWER EXTREMITY ROM:     Hip flexibility limited by  25% with significant gluteal pain Rt>Lt with passive testing  LOWER EXTREMITY MMT:    MMT Right eval Left eval  Hip flexion 3+ 4-  Hip extension 4- 4  Hip abduction 4- 4  Hip adduction    Hip internal rotation    Hip external rotation    Knee flexion 4 4+  Knee extension 4 4+  Ankle dorsiflexion 4 4  Ankle plantarflexion    Ankle inversion    Ankle eversion     (Blank rows = not tested)    GAIT: Distance walked: 50 Assistive device utilized: None Level of assistance: Complete Independence Comments: No gait deviations   TODAY'S TREATMENT  Date: 12/08/21 UBE x 5 min (2.5 fwd and 2.5 reverse) Tband shoulder ext, row, bilateral ER, and horizontal abd 2 x 10 each with red band 3 way scapular stabilization with blue loop x 10 each UE 4 D ball rolls x 20 each Lower trap lift off 2 x 10 SKTC x 5 each hold 10 sec DKTC x 5 hold 10 sec PPT x 20 PPT with 90/90 heel tap Supine hamstring stretch 3 x 30 sec each LE  Date: 12/02/21  Hamstring stretch 2x20 seconds  Seated piriformis stretch: 2x20 seconds Open book x10 Knee to chest 2x20 seconds  Trunk rotation 2x20 seconds Trigger Point Dry-Needling  Treatment instructions: Expect mild to moderate muscle soreness. S/S of pneumothorax if dry needled over a lung field, and to seek immediate medical attention should they occur. Patient verbalized understanding of these instructions and education.  Patient Consent Given: Yes Education handout provided: Previously provided Muscles treated: Bil thoracic and lumbar multifidi, bil gluteals Treatment response/outcome: Utilized skilled palpation to identify trigger points.  During dry needling able to palpate muscle twitch and muscle elongation  Elongation and release to bil lumbar and thoracic spine  Skilled palpation and monitoring by PT during dry needling   Date: 12/01/21 HEP established-see below    PATIENT EDUCATION:  Education details: Access Code: [email or web address], DN info Person  educated: Patient Education method: Explanation, Demonstration, and Handouts Education comprehension: verbalized understanding and returned demonstration   HOME EXERCISE PROGRAM: Access Code: [email or web address] URL: [email or web address] Date: 12/01/2021 Prepared by: Tresa Endo  Exercises - Supine Lower Trunk Rotation  -  1 x daily - 7 x weekly - 1 sets - 3 reps - 20 hold - Hooklying Single Knee to Chest  - 3 x daily - 7 x weekly - 1 sets - 3 reps - 20 hold - Seated Hamstring Stretch  - 3 x daily - 7 x weekly - 1 sets - 3 reps - 20 hold - Sidelying Open Book Thoracic Lumbar Rotation and Extension  - 2-3 x daily - 7 x weekly - 1 sets - 10 reps - Seated Figure 4 Piriformis Stretch  - 2-3 x daily - 7 x weekly - 1 sets - 3 reps - 20 hold .   ASSESSMENT:  CLINICAL IMPRESSION: Patient is progressing appropriately.  She fatigued quite easily with scapular stabilization activities and needed frequent rest breaks.   She should continue to improve.  Patient will benefit from skilled PT to address the below impairments and improve overall function.    OBJECTIVE IMPAIRMENTS decreased activity tolerance, difficulty walking, decreased strength, impaired perceived functional ability, increased muscle spasms, impaired flexibility, improper body mechanics, postural dysfunction, and pain.   ACTIVITY LIMITATIONS carrying, lifting, bending, sitting, standing, sleeping, and transfers  PARTICIPATION LIMITATIONS: meal prep, cleaning, laundry, shopping, community activity, and yard work  PERSONAL FACTORS Time since onset of injury/illness/exacerbation and 1 comorbidity: Rt THA in 2021, chronic LBP  are also affecting patient's functional outcome.   REHAB POTENTIAL: Good  CLINICAL DECISION MAKING: Stable/uncomplicated  EVALUATION COMPLEXITY: Low   GOALS: Goals reviewed with patient? Yes  SHORT TERM GOALS: Target date: 12/29/2021    Be independent in initial HEP Baseline: Goal status: MET  12/08/21  2.  Report > or  to 30% reduction in thoracic/lumbar pain with self-care and ADLs  Baseline: constant 4-8/10 Goal status: INITIAL  3.  Verbalize and demonstrate body mechanics modifications for lumbar protection with daily tasks  Baseline:  Goal status: Progresssing  4.  Wake with pain <or = to 1x/night  Baseline: waking 2x/night with pain Goal status: INITIAL  LONG TERM GOALS: Target date: 01/26/2022  Be independent in advanced HEP Baseline:  Goal status: INITIAL  2.  Reduce FOTO to < or = to 44 Baseline:  Goal status: INITIAL  3.  Reduce back pain to stand > or = to 15-20 minutes for ADLs and community tasks without limitation Baseline: 10 min max  Goal status: INITIAL  4.  Wake with pain < or = to 3 nights/wk Baseline: waking 2x/night  Goal status: INITIAL  5.  Report > or = to 60% reduction in back pain with ADLs and community tasks  Baseline:  Goal status: INITIAL  6.  Demonstrate > or = to 4 to 4+/5 LE strength to improve endurance for standing tasks  Goal status: Initial PLAN: PT FREQUENCY: 1-2x/week  PT DURATION: 8 weeks  PLANNED INTERVENTIONS: Therapeutic exercises, Therapeutic activity, Neuromuscular re-education, Balance training, Gait training, Patient/Family education, Self Care, Joint mobilization, Aquatic Therapy, Dry Needling, Electrical stimulation, Spinal manipulation, Spinal mobilization, Cryotherapy, Moist heat, Taping, Traction, Ultrasound, and Manual therapy.  PLAN FOR NEXT SESSION: postural strength, thoracolumbar mobility, DN to lumbar and thoracic   Lakota Schweppe B. Javana Schey, PT 12/08/21 3:33 PM   [facility] [address] [place], [place] [address] Phone # [phone] Fax [phone]

## 2021-12-10 ENCOUNTER — Other Ambulatory Visit (HOSPITAL_COMMUNITY): Payer: Self-pay

## 2021-12-10 MED ORDER — METRONIDAZOLE 0.75 % VA GEL
VAGINAL | 0 refills | Status: DC
  Filled 2021-12-10: qty 70, 5d supply, fill #0

## 2021-12-14 ENCOUNTER — Other Ambulatory Visit (HOSPITAL_COMMUNITY): Payer: Self-pay

## 2021-12-14 ENCOUNTER — Ambulatory Visit: Payer: BC Managed Care – PPO | Admitting: Family Medicine

## 2021-12-14 ENCOUNTER — Encounter: Payer: Self-pay | Admitting: Family Medicine

## 2021-12-14 VITALS — BP 131/82 | HR 77 | Temp 97.6°F | Resp 16 | Ht 64.0 in | Wt 250.6 lb

## 2021-12-14 DIAGNOSIS — L309 Dermatitis, unspecified: Secondary | ICD-10-CM

## 2021-12-14 DIAGNOSIS — N62 Hypertrophy of breast: Secondary | ICD-10-CM | POA: Diagnosis not present

## 2021-12-14 DIAGNOSIS — Z6841 Body Mass Index (BMI) 40.0 and over, adult: Secondary | ICD-10-CM | POA: Diagnosis not present

## 2021-12-14 DIAGNOSIS — R87619 Unspecified abnormal cytological findings in specimens from cervix uteri: Secondary | ICD-10-CM | POA: Insufficient documentation

## 2021-12-14 MED ORDER — EUCRISA 2 % EX OINT
TOPICAL_OINTMENT | CUTANEOUS | 1 refills | Status: DC
  Filled 2021-12-14: qty 60, 30d supply, fill #0

## 2021-12-14 NOTE — Progress Notes (Unsigned)
New Patient Office Visit  Subjective    Patient ID: Sandra Bird, female    DOB: 1970-07-24  Age: 51 y.o. MRN: [id number]  CC:  Chief Complaint  Patient presents with   Follow-up    HPI [name] presents to establish care ***  Outpatient Encounter Medications as of 12/14/2021  Medication Sig   albuterol (VENTOLIN HFA) 108 (90 Base) MCG/ACT inhaler    aspirin EC 81 MG tablet Take by mouth.   atorvastatin (LIPITOR) 20 MG tablet Take 1 tablet by mouth daily.   atovaquone-proguanil (MALARONE) 250-100 MG TABS tablet    baclofen (LIORESAL) 20 MG tablet    Capsaicin-Menthol-Methyl Sal (DENDRACIN NEURODENDRAXCIN) 0.025-10-30 % LOTN APPLY SMALL AMOUNT TO THE AFFECTED AREA UP TO THREE TIMES DAILY AS NEEDED FOR PAIN - DO NOT USE THE SAME TIME AS PENNSAID   celecoxib (CELEBREX) 200 MG capsule Take 1 capsule by mouth once a day with food as needed for pain   celecoxib (CELEBREX) 50 MG capsule Take by mouth.   clobetasol cream (TEMOVATE) 0.05 % Apply to affected area two times daily   clotrimazole-betamethasone (LOTRISONE) cream APPLY A THIN COAT DAILY AS NEEDED   diclofenac (CATAFLAM) 50 MG tablet take 1 tablet by mouth once daily with food for 3 weeks then if needed   Diclofenac Sodium 1.5 % SOLN APPLY SMALL AMOUNT (ABOUT 10 DROPS) TO THE AFFECTED AREA TWICE DAILY AS NEEDED FOR PAIN - DO NOT USE THE SAME TIME AS DENDRACIN   Diethylpropion HCl CR 75 MG TB24 Take 1 tablet by mouth daily.   doxycycline (VIBRA-TABS) 100 MG tablet    famotidine (PEPCID) 20 MG tablet TAKE 1 TABLET BY MOUTH EVERY EVENING AT BEDTIME   HYDROcodone-acetaminophen (NORCO/VICODIN) 5-325 MG tablet TAKE 1-2 TABLETS BY MOUTH EVERY 6 HOURS AS NEEDED FOR MODERATE OR SEVERE PAIN (POST OP PAIN).   latanoprost (XALATAN) 0.005 % ophthalmic solution Place 1 drop into both eyes nightly.   levofloxacin (LEVAQUIN) 500 MG tablet    Lorcaserin HCl 10 MG TABS TK 1 T PO  BID   medroxyPROGESTERone (PROVERA) 10 MG tablet     methocarbamol (ROBAXIN) 750 MG tablet    metroNIDAZOLE (FLAGYL) 500 MG tablet TAKE 4 TABLETS BY MOUTH AS 1 DOSE   Multiple Vitamins-Minerals (CENTRUM ADULT PO) Centrum   naproxen (NAPROSYN) 500 MG tablet    nystatin-triamcinolone (MYCOLOG II) cream APPLY TO THE AFFECTED AREA(S) BY TOPICAL ROUTE 2 TIMES PER DAY IN THEMORNING AND EVENING   ofloxacin (OCUFLOX) 0.3 % ophthalmic solution INSTILL 5-10 DROPS TO EYES TWICE DAILY AS DIRECTED.   ondansetron (ZOFRAN-ODT) 4 MG disintegrating tablet Dissolve 1 tablet (4 mg total) by mouth every 8 (eight) hours as needed for nausea or vomiting.   oxyCODONE (ROXICODONE) 5 MG immediate release tablet Take 1 tablet (5 mg total) by mouth every 6 (six) hours as needed for up to 5 days for severe pain.   pantoprazole (PROTONIX) 40 MG tablet Take 1 tablet by mouth once a day   phentermine 37.5 MG capsule Take 1 (one) Capsule by mouth daily   polyethylene glycol-electrolytes (NULYTELY) 420 g solution See admin instructions.   Vitamin D, Ergocalciferol, 50000 units CAPS Take 1 capsule by mouth once a week.   [DISCONTINUED] Crisaborole (EUCRISA) 2 % OINT Use 1 (one) Application two times daily   Boric Acid Vaginal (AZO BORIC ACID) 600 MG SUPP Compounded Boric Acid 600mg  vaginal capsules  Boric Acid 600mg  vaginal capsules.  DO NOT TAKE ORALLY/TOXIC  insert  1 suppository weekly x 6 weeks; then use prn   Crisaborole (EUCRISA) 2 % OINT Use 1 (one) Application two times daily   linaclotide (LINZESS) 145 MCG CAPS capsule TAKE 1 CAPSULE BY MOUTH AT LEAST 30 MINUTES BEFORE THE FIRST MEAL OF THE DAY ON AN EMPTY STOMACH   Liraglutide -Weight Management (SAXENDA) 18 MG/3ML SOPN Inject 3 mg under the skin daily (Patient not taking: Reported on 12/14/2021)   [DISCONTINUED] aspirin EC 81 MG tablet Take 1 tablet (81 mg total) by mouth 2 (two) times daily after a meal. Swallow whole.   [DISCONTINUED] ergocalciferol (VITAMIN D2) 1.25 MG (50000 UT) capsule Take 1 capsule (50,000 Units  total) by mouth once a week.   [DISCONTINUED] latanoprost (XALATAN) 0.005 % ophthalmic solution Place 1 drop into both eyes every evening   [DISCONTINUED] metroNIDAZOLE (METROGEL) 0.75 % vaginal gel Place 1 applicatorful vaginally at bedtime for 5 nights   [DISCONTINUED] Multiple Vitamin (MULTIVITAMIN WITH MINERALS) TABS tablet Take 1 tablet by mouth daily.   No facility-administered encounter medications on file as of 12/14/2021.    Past Medical History:  Diagnosis Date   Arthritis    SVD (spontaneous vaginal delivery)    x 3    Past Surgical History:  Procedure Laterality Date   CHOLECYSTECTOMY     DILITATION & CURRETTAGE/HYSTROSCOPY WITH NOVASURE ABLATION N/A 07/25/2013   Procedure: DILATATION & CURETTAGE/HYSTEROSCOPY WITH NOVASURE ABLATION;  Surgeon: Miguel Aschoff, MD;  Location: [facility];  Service: Gynecology;  Laterality: N/A;   TONSILLECTOMY     TOTAL HIP ARTHROPLASTY Right 03/24/2020   Procedure: RIGHT TOTAL HIP ARTHROPLASTY ANTERIOR APPROACH;  Surgeon: Marcene Corning, MD;  Location: [facility];  Service: Orthopedics;  Laterality: Right;   TUBAL LIGATION     WISDOM TOOTH EXTRACTION      Family History  Problem Relation Age of Onset   Heart failure Mother    Heart attack Father     Social History   Socioeconomic History   Marital status: Single    Spouse name: Not on file   Number of children: Not on file   Years of education: Not on file   Highest education level: Not on file  Occupational History   Not on file  Tobacco Use   Smoking status: Never   Smokeless tobacco: Never  Vaping Use   Vaping Use: Never used  Substance and Sexual Activity   Alcohol use: Yes    Comment: socially   Drug use: No   Sexual activity: Yes    Birth control/protection: Surgical  Other Topics Concern   Not on file  Social History Narrative   Not on file   Social Determinants of Health   Financial Resource Strain: Not on file  Food Insecurity: Not on file  Transportation Needs: Not  on file  Physical Activity: Not on file  Stress: Not on file  Social Connections: Not on file  Intimate Partner Violence: Not on file    ROS      Objective    BP 131/82   Pulse 77   Temp 97.6 F (36.4 C) (Oral)   Resp 16   Ht 5\' 4"  (1.626 m)   Wt 250 lb 9.6 oz (113.7 kg)   SpO2 99%   BMI 43.02 kg/m   Physical Exam  {Labs (Optional):23779}    Assessment & Plan:   Problem List Items Addressed This Visit   None   No follow-ups on file.   , MD

## 2021-12-16 ENCOUNTER — Ambulatory Visit: Payer: BC Managed Care – PPO

## 2021-12-16 DIAGNOSIS — M5459 Other low back pain: Secondary | ICD-10-CM

## 2021-12-16 DIAGNOSIS — M6281 Muscle weakness (generalized): Secondary | ICD-10-CM

## 2021-12-16 DIAGNOSIS — M546 Pain in thoracic spine: Secondary | ICD-10-CM | POA: Diagnosis not present

## 2021-12-16 DIAGNOSIS — R252 Cramp and spasm: Secondary | ICD-10-CM

## 2021-12-16 NOTE — Therapy (Signed)
OUTPATIENT PHYSICAL THERAPY TREATMENT   Patient Name: Sandra Bird MRN: [id number] DOB:12-Aug-1970, 51 y.o., female Today's Date: 12/16/2021   PT End of Session - 12/16/21 0931     Visit Number 4    Date for PT Re-Evaluation 01/26/22    Authorization Type BCBS    PT Start Time 0847    PT Stop Time 0933    PT Time Calculation (min) 46 min    Activity Tolerance Patient tolerated treatment well    Behavior During Therapy [facility] for tasks assessed/performed               Past Medical History:  Diagnosis Date   Arthritis    SVD (spontaneous vaginal delivery)    x 3   Past Surgical History:  Procedure Laterality Date   CHOLECYSTECTOMY     DILITATION & CURRETTAGE/HYSTROSCOPY WITH NOVASURE ABLATION N/A 07/25/2013   Procedure: DILATATION & CURETTAGE/HYSTEROSCOPY WITH NOVASURE ABLATION;  Surgeon: Gus Height, MD;  Location: [facility];  Service: Gynecology;  Laterality: N/A;   TONSILLECTOMY     TOTAL HIP ARTHROPLASTY Right 03/24/2020   Procedure: RIGHT TOTAL HIP ARTHROPLASTY ANTERIOR APPROACH;  Surgeon: Melrose Nakayama, MD;  Location: [facility];  Service: Orthopedics;  Laterality: Right;   TUBAL LIGATION     WISDOM TOOTH EXTRACTION     Patient Active Problem List   Diagnosis Date Noted   Abnormal Pap smear of cervix 12/14/2021   Hypercholesteremia 11/19/2021   OA (osteoarthritis) 11/19/2021   Microscopic hematuria 09/08/2021   Hyperlipidemia 12/09/2020   Menometrorrhagia 12/09/2020   Tinea cruris 12/09/2020   S/P endometrial ablation 07/21/2020   Suspected sleep apnea 07/16/2020   Primary localized osteoarthritis of right hip 03/24/2020   Primary osteoarthritis of right hip 03/24/2020   Osteoarthritis of right hip 03/24/2020   Hypercholesterolemia 07/12/2018   IFG (impaired fasting glucose) 08/12/2012   Morbid (severe) obesity due to excess calories ([place]) 06/12/2012   OBSTRUCTIVE SLEEP APNEA 05/27/2009    PCP: Dorna Mai, MD   REFERRING PROVIDER: Lennice Sites,  MD  REFERRING DIAG: Panniculitis, possible breast reduction  Rationale for Evaluation and Treatment Rehabilitation  THERAPY DIAG:  Pain in thoracic spine  Other low back pain  Cramp and spasm  Muscle weakness (generalized)  ONSET DATE: chronic LBP and thoracic pain (>5 years)   SUBJECTIVE:                                                                                                                                                                                           SUBJECTIVE STATEMENT: This is helping me.  The DN felt good.  45% overall improvement  PERTINENT HISTORY:  Rt total hip replacement (2021)  PAIN:  Are you having pain? Yes: NPRS scale: 4/10 Pain location: bil low back and thoracic spine Pain description: sore, aching, dull, shooting  Aggravating factors: morning hours, standing, bending, sleep (waking 2x/night)  Relieving factors: Aleve, heat/ice  PRECAUTIONS: None  WEIGHT BEARING RESTRICTIONS No  FALLS:  Has patient fallen in last 6 months? No  LIVING ENVIRONMENT: Lives with: lives with their family Lives in: House/apartment  OCCUPATION: retired  PLOF: Independent  PATIENT GOALS reduce pain in back and thoracic spine, walking, exercise   OBJECTIVE: 12/01/21  DIAGNOSTIC FINDINGS:  None   PATIENT SURVEYS:  FOTO 24 (goal is 55)  SCREENING FOR RED FLAGS: Bowel or bladder incontinence: No Spinal tumors: No Cauda equina syndrome: No Compression fracture: No Abdominal aneurysm: No  COGNITION:  Overall cognitive status: Within functional limits for tasks assessed     SENSATION: WFL   POSTURE: rounded shoulders, forward head, and flexed trunk   PALPATION: pt with diffuse and significant palpable tenderness over bil thoracic/lumbar paraspinals, gluteals and rhomboids.  Reduced PA mobility in the lumbar and thoracic spine with pain.    LUMBAR ROM:  Limited by 25% in all directions with pain in lumbar/thoracic spine.  LOWER  EXTREMITY ROM:     Hip flexibility limited by 25% with significant gluteal pain Rt>Lt with passive testing  LOWER EXTREMITY MMT:    MMT Right eval Left eval  Hip flexion 3+ 4-  Hip extension 4- 4  Hip abduction 4- 4  Hip adduction    Hip internal rotation    Hip external rotation    Knee flexion 4 4+  Knee extension 4 4+  Ankle dorsiflexion 4 4  Ankle plantarflexion    Ankle inversion    Ankle eversion     (Blank rows = not tested)    GAIT: Distance walked: 50 Assistive device utilized: None Level of assistance: Complete Independence Comments: No gait deviations   TODAY'S TREATMENT  Date: 12/16/21  Arm bike: Level 1x 6 minutes (3/3) Red theraband: rows and shoulder extension x20 each Trigger Point Dry-Needling  Treatment instructions: Expect mild to moderate muscle soreness. S/S of pneumothorax if dry needled over a lung field, and to seek immediate medical attention should they occur. Patient verbalized understanding of these instructions and education.  Patient Consent Given: Yes Education handout provided: Previously provided Muscles treated: Bil thoracic and lumbar multifidi, bil gluteals Treatment response/outcome: Utilized skilled palpation to identify trigger points.  During dry needling able to palpate muscle twitch and muscle elongation  Elongation and release to bil lumbar and thoracic spine  Skilled palpation and monitoring by PT during dry needling  Date: 12/08/21 UBE x5 (2.5/2.5) Tband shoulder ext, row, bilateral ER, and horizontal abd 2 x 10 each with red band 3 way scapular stabilization with blue loop x 10 each UE 4 D ball rolls x 20 each PPT with 90/90 heel tap Supine hamstring stretch 3 x 30 sec each LE  Date: 12/02/21  Hamstring stretch 2x20 seconds  Seated piriformis stretch: 2x20 seconds Open book x10 Knee to chest 2x20 seconds  Trunk rotation 2x20 seconds Trigger Point Dry-Needling  Treatment instructions: Expect mild to moderate muscle  soreness. S/S of pneumothorax if dry needled over a lung field, and to seek immediate medical attention should they occur. Patient verbalized understanding of these instructions and education.  Patient Consent Given: Yes Education handout provided: Previously provided Muscles treated: Bil thoracic and lumbar multifidi, bil gluteals, Rt lateral  hamstrings  Treatment response/outcome: Utilized skilled palpation to identify trigger points.  During dry needling able to palpate muscle twitch and muscle elongation  Elongation and release to bil lumbar and thoracic spine  Skilled palpation and monitoring by PT during dry needling     PATIENT EDUCATION:  Education details: Access Code: [email or web address], DN info Person educated: Patient Education method: Explanation, Demonstration, and Handouts Education comprehension: verbalized understanding and returned demonstration   HOME EXERCISE PROGRAM: Access Code: [email or web address] URL: [email or web address] Date: 12/16/2021 Prepared by: Claiborne Billings  Exercises - Supine Lower Trunk Rotation  - 1 x daily - 7 x weekly - 1 sets - 3 reps - 20 hold - Hooklying Single Knee to Chest  - 3 x daily - 7 x weekly - 1 sets - 3 reps - 20 hold - Seated Hamstring Stretch  - 3 x daily - 7 x weekly - 1 sets - 3 reps - 20 hold - Sidelying Open Book Thoracic Lumbar Rotation and Extension  - 2-3 x daily - 7 x weekly - 1 sets - 10 reps - Seated Figure 4 Piriformis Stretch  - 2-3 x daily - 7 x weekly - 1 sets - 3 reps - 20 hold - Shoulder Extension with Resistance  - 2 x daily - 7 x weekly - 2 sets - 10 reps - Standing Shoulder Row with Anchored Resistance  - 2 x daily - 7 x weekly - 2 sets - 10 reps .   ASSESSMENT:  CLINICAL IMPRESSION: Pt reports 45% overall imrpovement in symptoms since the start of care.  Pt did very well with DN last session and had good response today with twitch and improved tissue mobility.  Pt reports that she continues to wake up at night with pain.   Pt required tactile cues for scapular depression and head alignment with theraband exercises.  Pt will have 1 more session prior to her surgery and will be placed on hold until she is cleared to return.  Patient will benefit from skilled PT to address the below impairments and improve overall function.    OBJECTIVE IMPAIRMENTS decreased activity tolerance, difficulty walking, decreased strength, impaired perceived functional ability, increased muscle spasms, impaired flexibility, improper body mechanics, postural dysfunction, and pain.   ACTIVITY LIMITATIONS carrying, lifting, bending, sitting, standing, sleeping, and transfers  PARTICIPATION LIMITATIONS: meal prep, cleaning, laundry, shopping, community activity, and yard work  PERSONAL FACTORS Time since onset of injury/illness/exacerbation and 1 comorbidity: Rt THA in 2021, chronic LBP  are also affecting patient's functional outcome.   REHAB POTENTIAL: Good  CLINICAL DECISION MAKING: Stable/uncomplicated  EVALUATION COMPLEXITY: Low   GOALS: Goals reviewed with patient? Yes  SHORT TERM GOALS: Target date: 12/29/2021    Be independent in initial HEP Baseline: Goal status: MET 12/08/21  2.  Report > or  to 30% reduction in thoracic/lumbar pain with self-care and ADLs  Baseline: constant 4-8/10 Goal status: INITIAL  3.  Verbalize and demonstrate body mechanics modifications for lumbar protection with daily tasks  Baseline:  Goal status: Progresssing  4.  Wake with pain <or = to 1x/night  Baseline: waking 2x/night with pain Goal status: INITIAL  LONG TERM GOALS: Target date: 01/26/2022  Be independent in advanced HEP Baseline:  Goal status: INITIAL  2.  Reduce FOTO to < or = to 44 Baseline:  Goal status: INITIAL  3.  Reduce back pain to stand > or = to 15-20 minutes for ADLs and community tasks without limitation Baseline: 10 min max  Goal status:  INITIAL  4.  Wake with pain < or = to 3 nights/wk Baseline: waking  2x/night  Goal status: INITIAL  5.  Report > or = to 60% reduction in back pain with ADLs and community tasks  Baseline:  Goal status: INITIAL  6.  Demonstrate > or = to 4 to 4+/5 LE strength to improve endurance for standing tasks  Goal status: Initial PLAN: PT FREQUENCY: 1-2x/week  PT DURATION: 8 weeks  PLANNED INTERVENTIONS: Therapeutic exercises, Therapeutic activity, Neuromuscular re-education, Balance training, Gait training, Patient/Family education, Self Care, Joint mobilization, Aquatic Therapy, Dry Needling, Electrical stimulation, Spinal manipulation, Spinal mobilization, Cryotherapy, Moist heat, Taping, Traction, Ultrasound, and Manual therapy.  PLAN FOR NEXT SESSION: Hold after next session while pt has surgery.  DN next session and review HEP   Sigurd Sos, PT 12/16/21 9:39 AM   [facility] [address] [place], [place] [address] Phone # [phone] Fax [phone]

## 2021-12-21 ENCOUNTER — Ambulatory Visit: Payer: BC Managed Care – PPO

## 2021-12-21 DIAGNOSIS — M546 Pain in thoracic spine: Secondary | ICD-10-CM

## 2021-12-21 DIAGNOSIS — M5459 Other low back pain: Secondary | ICD-10-CM

## 2021-12-21 DIAGNOSIS — R252 Cramp and spasm: Secondary | ICD-10-CM

## 2021-12-21 DIAGNOSIS — M6281 Muscle weakness (generalized): Secondary | ICD-10-CM

## 2021-12-21 NOTE — Therapy (Addendum)
OUTPATIENT PHYSICAL THERAPY TREATMENT   Patient Name: Sandra Bird MRN: [id number] DOB:1970/12/17, 51 y.o., female Today's Date: 12/21/2021   PT End of Session - 12/21/21 1055     Visit Number 5    Date for PT Re-Evaluation 01/26/22    Authorization Type BCBS    PT Start Time 1020    PT Stop Time 1055    PT Time Calculation (min) 35 min    Activity Tolerance Patient tolerated treatment well    Behavior During Therapy WFL for tasks assessed/performed                Past Medical History:  Diagnosis Date   Arthritis    SVD (spontaneous vaginal delivery)    x 3   Past Surgical History:  Procedure Laterality Date   CHOLECYSTECTOMY     DILITATION & CURRETTAGE/HYSTROSCOPY WITH NOVASURE ABLATION N/A 07/25/2013   Procedure: DILATATION & CURETTAGE/HYSTEROSCOPY WITH NOVASURE ABLATION;  Surgeon: Gus Height, MD;  Location: [facility];  Service: Gynecology;  Laterality: N/A;   TONSILLECTOMY     TOTAL HIP ARTHROPLASTY Right 03/24/2020   Procedure: RIGHT TOTAL HIP ARTHROPLASTY ANTERIOR APPROACH;  Surgeon: Melrose Nakayama, MD;  Location: [facility];  Service: Orthopedics;  Laterality: Right;   TUBAL LIGATION     WISDOM TOOTH EXTRACTION     Patient Active Problem List   Diagnosis Date Noted   Abnormal Pap smear of cervix 12/14/2021   Hypercholesteremia 11/19/2021   OA (osteoarthritis) 11/19/2021   Microscopic hematuria 09/08/2021   Hyperlipidemia 12/09/2020   Menometrorrhagia 12/09/2020   Tinea cruris 12/09/2020   S/P endometrial ablation 07/21/2020   Suspected sleep apnea 07/16/2020   Primary localized osteoarthritis of right hip 03/24/2020   Primary osteoarthritis of right hip 03/24/2020   Osteoarthritis of right hip 03/24/2020   Hypercholesterolemia 07/12/2018   IFG (impaired fasting glucose) 08/12/2012   Morbid (severe) obesity due to excess calories ([place]) 06/12/2012   OBSTRUCTIVE SLEEP APNEA 05/27/2009    PCP: Dorna Mai, MD   REFERRING PROVIDER: Lennice Sites,  MD  REFERRING DIAG: Panniculitis, possible breast reduction  Rationale for Evaluation and Treatment Rehabilitation  THERAPY DIAG:  Pain in thoracic spine  Other low back pain  Cramp and spasm  Muscle weakness (generalized)  ONSET DATE: chronic LBP and thoracic pain (>5 years)   SUBJECTIVE:                                                                                                                                                                                           SUBJECTIVE STATEMENT: The DN felt good, but my pain increased a few days ago.  45%  OUTPATIENT PHYSICAL THERAPY TREATMENT   Patient Name: Sandra Bird MRN: [id number] DOB:1970/12/17, 51 y.o., female Today's Date: 12/21/2021   PT End of Session - 12/21/21 1055     Visit Number 5    Date for PT Re-Evaluation 01/26/22    Authorization Type BCBS    PT Start Time 1020    PT Stop Time 1055    PT Time Calculation (min) 35 min    Activity Tolerance Patient tolerated treatment well    Behavior During Therapy WFL for tasks assessed/performed                Past Medical History:  Diagnosis Date   Arthritis    SVD (spontaneous vaginal delivery)    x 3   Past Surgical History:  Procedure Laterality Date   CHOLECYSTECTOMY     DILITATION & CURRETTAGE/HYSTROSCOPY WITH NOVASURE ABLATION N/A 07/25/2013   Procedure: DILATATION & CURETTAGE/HYSTEROSCOPY WITH NOVASURE ABLATION;  Surgeon: Gus Height, MD;  Location: [facility];  Service: Gynecology;  Laterality: N/A;   TONSILLECTOMY     TOTAL HIP ARTHROPLASTY Right 03/24/2020   Procedure: RIGHT TOTAL HIP ARTHROPLASTY ANTERIOR APPROACH;  Surgeon: Melrose Nakayama, MD;  Location: [facility];  Service: Orthopedics;  Laterality: Right;   TUBAL LIGATION     WISDOM TOOTH EXTRACTION     Patient Active Problem List   Diagnosis Date Noted   Abnormal Pap smear of cervix 12/14/2021   Hypercholesteremia 11/19/2021   OA (osteoarthritis) 11/19/2021   Microscopic hematuria 09/08/2021   Hyperlipidemia 12/09/2020   Menometrorrhagia 12/09/2020   Tinea cruris 12/09/2020   S/P endometrial ablation 07/21/2020   Suspected sleep apnea 07/16/2020   Primary localized osteoarthritis of right hip 03/24/2020   Primary osteoarthritis of right hip 03/24/2020   Osteoarthritis of right hip 03/24/2020   Hypercholesterolemia 07/12/2018   IFG (impaired fasting glucose) 08/12/2012   Morbid (severe) obesity due to excess calories ([place]) 06/12/2012   OBSTRUCTIVE SLEEP APNEA 05/27/2009    PCP: Dorna Mai, MD   REFERRING PROVIDER: Lennice Sites,  MD  REFERRING DIAG: Panniculitis, possible breast reduction  Rationale for Evaluation and Treatment Rehabilitation  THERAPY DIAG:  Pain in thoracic spine  Other low back pain  Cramp and spasm  Muscle weakness (generalized)  ONSET DATE: chronic LBP and thoracic pain (>5 years)   SUBJECTIVE:                                                                                                                                                                                           SUBJECTIVE STATEMENT: The DN felt good, but my pain increased a few days ago.  45%  OUTPATIENT PHYSICAL THERAPY TREATMENT   Patient Name: Sandra Bird MRN: [id number] DOB:1970/12/17, 51 y.o., female Today's Date: 12/21/2021   PT End of Session - 12/21/21 1055     Visit Number 5    Date for PT Re-Evaluation 01/26/22    Authorization Type BCBS    PT Start Time 1020    PT Stop Time 1055    PT Time Calculation (min) 35 min    Activity Tolerance Patient tolerated treatment well    Behavior During Therapy WFL for tasks assessed/performed                Past Medical History:  Diagnosis Date   Arthritis    SVD (spontaneous vaginal delivery)    x 3   Past Surgical History:  Procedure Laterality Date   CHOLECYSTECTOMY     DILITATION & CURRETTAGE/HYSTROSCOPY WITH NOVASURE ABLATION N/A 07/25/2013   Procedure: DILATATION & CURETTAGE/HYSTEROSCOPY WITH NOVASURE ABLATION;  Surgeon: Gus Height, MD;  Location: [facility];  Service: Gynecology;  Laterality: N/A;   TONSILLECTOMY     TOTAL HIP ARTHROPLASTY Right 03/24/2020   Procedure: RIGHT TOTAL HIP ARTHROPLASTY ANTERIOR APPROACH;  Surgeon: Melrose Nakayama, MD;  Location: [facility];  Service: Orthopedics;  Laterality: Right;   TUBAL LIGATION     WISDOM TOOTH EXTRACTION     Patient Active Problem List   Diagnosis Date Noted   Abnormal Pap smear of cervix 12/14/2021   Hypercholesteremia 11/19/2021   OA (osteoarthritis) 11/19/2021   Microscopic hematuria 09/08/2021   Hyperlipidemia 12/09/2020   Menometrorrhagia 12/09/2020   Tinea cruris 12/09/2020   S/P endometrial ablation 07/21/2020   Suspected sleep apnea 07/16/2020   Primary localized osteoarthritis of right hip 03/24/2020   Primary osteoarthritis of right hip 03/24/2020   Osteoarthritis of right hip 03/24/2020   Hypercholesterolemia 07/12/2018   IFG (impaired fasting glucose) 08/12/2012   Morbid (severe) obesity due to excess calories ([place]) 06/12/2012   OBSTRUCTIVE SLEEP APNEA 05/27/2009    PCP: Dorna Mai, MD   REFERRING PROVIDER: Lennice Sites,  MD  REFERRING DIAG: Panniculitis, possible breast reduction  Rationale for Evaluation and Treatment Rehabilitation  THERAPY DIAG:  Pain in thoracic spine  Other low back pain  Cramp and spasm  Muscle weakness (generalized)  ONSET DATE: chronic LBP and thoracic pain (>5 years)   SUBJECTIVE:                                                                                                                                                                                           SUBJECTIVE STATEMENT: The DN felt good, but my pain increased a few days ago.  45%  OUTPATIENT PHYSICAL THERAPY TREATMENT   Patient Name: Sandra Bird MRN: [id number] DOB:1970/12/17, 51 y.o., female Today's Date: 12/21/2021   PT End of Session - 12/21/21 1055     Visit Number 5    Date for PT Re-Evaluation 01/26/22    Authorization Type BCBS    PT Start Time 1020    PT Stop Time 1055    PT Time Calculation (min) 35 min    Activity Tolerance Patient tolerated treatment well    Behavior During Therapy WFL for tasks assessed/performed                Past Medical History:  Diagnosis Date   Arthritis    SVD (spontaneous vaginal delivery)    x 3   Past Surgical History:  Procedure Laterality Date   CHOLECYSTECTOMY     DILITATION & CURRETTAGE/HYSTROSCOPY WITH NOVASURE ABLATION N/A 07/25/2013   Procedure: DILATATION & CURETTAGE/HYSTEROSCOPY WITH NOVASURE ABLATION;  Surgeon: Gus Height, MD;  Location: [facility];  Service: Gynecology;  Laterality: N/A;   TONSILLECTOMY     TOTAL HIP ARTHROPLASTY Right 03/24/2020   Procedure: RIGHT TOTAL HIP ARTHROPLASTY ANTERIOR APPROACH;  Surgeon: Melrose Nakayama, MD;  Location: [facility];  Service: Orthopedics;  Laterality: Right;   TUBAL LIGATION     WISDOM TOOTH EXTRACTION     Patient Active Problem List   Diagnosis Date Noted   Abnormal Pap smear of cervix 12/14/2021   Hypercholesteremia 11/19/2021   OA (osteoarthritis) 11/19/2021   Microscopic hematuria 09/08/2021   Hyperlipidemia 12/09/2020   Menometrorrhagia 12/09/2020   Tinea cruris 12/09/2020   S/P endometrial ablation 07/21/2020   Suspected sleep apnea 07/16/2020   Primary localized osteoarthritis of right hip 03/24/2020   Primary osteoarthritis of right hip 03/24/2020   Osteoarthritis of right hip 03/24/2020   Hypercholesterolemia 07/12/2018   IFG (impaired fasting glucose) 08/12/2012   Morbid (severe) obesity due to excess calories ([place]) 06/12/2012   OBSTRUCTIVE SLEEP APNEA 05/27/2009    PCP: Dorna Mai, MD   REFERRING PROVIDER: Lennice Sites,  MD  REFERRING DIAG: Panniculitis, possible breast reduction  Rationale for Evaluation and Treatment Rehabilitation  THERAPY DIAG:  Pain in thoracic spine  Other low back pain  Cramp and spasm  Muscle weakness (generalized)  ONSET DATE: chronic LBP and thoracic pain (>5 years)   SUBJECTIVE:                                                                                                                                                                                           SUBJECTIVE STATEMENT: The DN felt good, but my pain increased a few days ago.  45%

## 2021-12-23 DIAGNOSIS — M793 Panniculitis, unspecified: Secondary | ICD-10-CM | POA: Diagnosis not present

## 2021-12-23 DIAGNOSIS — Z411 Encounter for cosmetic surgery: Secondary | ICD-10-CM

## 2021-12-29 ENCOUNTER — Ambulatory Visit (INDEPENDENT_AMBULATORY_CARE_PROVIDER_SITE_OTHER): Payer: BC Managed Care – PPO | Admitting: Surgical

## 2021-12-29 DIAGNOSIS — M793 Panniculitis, unspecified: Secondary | ICD-10-CM

## 2021-12-29 NOTE — Progress Notes (Signed)
51 year old female here for follow-up after abdominoplasty with Dr. Domenica Reamer on 12/23/2021.She presents today for concerns of tenderness from the right JP drain insertion site as well as some puslike drainage.  She denies any cardiac or pulmonary symptoms.  Reports overall she is feeling well.  She does report that she has had some constipation, has been using various products but has some questions about additional options. She is not having any infectious symptoms.  She reports drain output has been approximately 25 cc per 24 hours from each drain.  She reports that the right one is very tender, reports it is tender because she accidentally pulled on the drain in the initial postoperative period on accident.   Chaperone present on exam Patient is well-developed, well-nourished, no acute distress.  On exam umbilicus is viable, abdominal incisions are CDI, healing well.  Bilateral JP drains are in place with some dark sanguinous drainage, approximately 10 cc per drain.  No erythema or cellulitic changes.  No foul odor is noted.  No active drainage noted.  Mild tenderness with palpation to abdomen, no significant swelling is noted.  No lower extremity swelling noted.     Plan:  Continue with compressive garments, avoid strenuous activities.  Recommend increased hydration to help with constipation, recommend also use of MiraLAX and if she does not notice improvement in the next 24 hours, recommend saline suppository/Fleet enema  Recommend continue to monitor JP drain output, we will leave it in place for approximately 1 more week.  There is no signs of infection on exam.  All of her questions were answered to her content.  She knows to call with any changes.  Dr. Domenica Reamer was also present for part of today's appointment to evaluate JP drains and to discuss drain plan.

## 2022-01-03 ENCOUNTER — Encounter: Payer: BC Managed Care – PPO | Admitting: Plastic Surgery

## 2022-01-04 ENCOUNTER — Ambulatory Visit (INDEPENDENT_AMBULATORY_CARE_PROVIDER_SITE_OTHER): Payer: BC Managed Care – PPO | Admitting: Plastic Surgery

## 2022-01-04 ENCOUNTER — Other Ambulatory Visit (HOSPITAL_COMMUNITY): Payer: Self-pay

## 2022-01-04 ENCOUNTER — Other Ambulatory Visit: Payer: Self-pay | Admitting: Family Medicine

## 2022-01-04 DIAGNOSIS — M793 Panniculitis, unspecified: Secondary | ICD-10-CM

## 2022-01-05 NOTE — Progress Notes (Signed)
Status post abdominal plasty with rectus plication and abdominal billable transposition on 12/23/2021.  She is very happy with initial result.  Physical exam Drains less than 25 mL and 24 hours.  Incision clean dry and intact with no hematoma or seroma.  Assessment and plan Doing well after abdominoplasty.  Drains were removed and she will come back next week to evaluate to make sure she does not develop a seroma.

## 2022-01-06 ENCOUNTER — Ambulatory Visit (INDEPENDENT_AMBULATORY_CARE_PROVIDER_SITE_OTHER): Payer: BC Managed Care – PPO | Admitting: Surgical

## 2022-01-06 ENCOUNTER — Other Ambulatory Visit (HOSPITAL_COMMUNITY): Payer: Self-pay

## 2022-01-06 ENCOUNTER — Encounter: Payer: Self-pay | Admitting: Surgical

## 2022-01-06 DIAGNOSIS — M793 Panniculitis, unspecified: Secondary | ICD-10-CM

## 2022-01-06 NOTE — Progress Notes (Signed)
Patient is a 51 year old female here for follow-up after abdominoplasty with Dr. Domenica Reamer 2 weeks ago.  She is overall doing well.  She presented today for concerns of drainage from her JP drain insertion sites. She had the drains removed 2 days ago.  She reports otherwise she is feeling good, she has been more active than usual.  She is not having any infectious symptoms.  Chaperone present on exam On exam incision is CDI, no hematoma or seroma noted.  She does have a small wound that is approximately 2 x 2 mm just above the pubic area.  She also has the 2 JP drain insertion sites, the right side is actively draining some serosanguineous drainage.  There is no erythema or cellulitic changes.  Patient is overall doing well after abdominoplasty, she presented today for concerns of bleeding from the JP drain insertion sites.  I reassured her that this is normal after drain removal, recommend keeping the area covered with gauze and pads to help collect the drainage.  I do not see any signs of infection.  Recommend continue with compressive garments.  I did discuss with her to avoid any strenuous activities or significantly increasing her activity level to help decrease swelling.  Patient was understanding of this.  She will call with questions or concerns.  We will plan to see her next week.

## 2022-01-07 ENCOUNTER — Telehealth: Payer: Self-pay | Admitting: *Deleted

## 2022-01-07 NOTE — Telephone Encounter (Signed)
Patient called on (12/28/21) regarding noticing

## 2022-01-11 ENCOUNTER — Encounter: Payer: Self-pay | Admitting: Student

## 2022-01-11 ENCOUNTER — Telehealth: Payer: Self-pay

## 2022-01-11 ENCOUNTER — Ambulatory Visit (INDEPENDENT_AMBULATORY_CARE_PROVIDER_SITE_OTHER): Payer: BC Managed Care – PPO | Admitting: Physician Assistant

## 2022-01-11 DIAGNOSIS — Z9889 Other specified postprocedural states: Secondary | ICD-10-CM

## 2022-01-11 NOTE — Progress Notes (Signed)
Patient is a 51 year old female who underwent panniculectomy with upper abdominal liposuction, plication, and transposition of the umbilicus with Dr. Erin Hearing on 12/23/2021.  Patient presents to the clinic for postoperative follow-up.  Patient was last seen in the clinic on 01/06/2022.  At that time, she reported that she was feeling well overall.  Drains had been already removed.  Small 2 x 2 millimeter pubic area wound.  No cellulitic changes.  Bleeding and drainage from JP drain insertion sites, reassurance provided.  Plan is for continued compressive garments and activity modifications.  She then called the following day and was advised to keep clean with water and peroxide.  Today, patient is doing okay.  She is frustrated with the copious amount of drainage from her incisional wounds.  She reports that she is changing dressings 3 times per day and that the high-volume drainage is soaking through her binder and spanks.  She has not been applying any Vaseline or peroxide and has simply been applying gauze.  She endorses mild bilateral ankle edema, but no significant unilateral changes.  She denies any fevers, pain, or other systemic symptoms.  Physical exam is largely reassuring.  Abdomen is soft, nondistended.  Mild swelling, but no large seroma otherwise amenable to aspiration attempt.  Multiple incisional wounds noted with moderate amount of serosanguineous drainage.  Drainage is thin, clear.  1 x 0.5 x 0.5 cm on the left side that is nondraining.  Larger 2.5 x 0.5 x 0.5 cm on the right side with the moderate amount of drainage.  There is also a 1 x 0.5 x 0.5 cm wound at midline that is mildly draining.  No cellulitic changes.  No significant lower extremity edema appreciated on exam.  Given moderate amount of drainage, will place order with [facility] for calcium alginate dressings followed by gauze, ABD pads, and secured with Medipore tape.  Exam is otherwise reassuring and there is no obvious concern for  infection.  Encouraged her to keep the incision clean and covered.  Advised against any continued peroxide given that it can impair wound healing.  Informed patient that she may likely continue to have moderate amount of drainage for another 1 to 2 weeks, but that the calcium alginate should help considerably.  Return in 2 weeks.  Plan for postoperative photos at that time.

## 2022-01-11 NOTE — Progress Notes (Signed)
Patient is a 51 year old female who underwent abdominoplasty with Dr. Erin Hearing on 12/23/2021.  Patient presents to the clinic for postoperative follow-up.  Patient was last seen in the clinic on 01/06/2022.  At this visit, patient had concerns for drainage from her JP drain sites after the drains have been removed 2 days prior to the visit.  Overall, patient reported she was feeling good and that she was not having any infectious symptoms.  Plan was for patient to keep the area covered with gauze and pads to help collect the drainage and to follow-up in 1 week.  Today,

## 2022-01-11 NOTE — Telephone Encounter (Signed)
Faxed order to [facility] with demographics, insurance card and ov note; received confirmation success; waiting for correspondence from [facility] that they have contacted pt and will forward all documents to front desk x batch scanning.

## 2022-01-12 ENCOUNTER — Other Ambulatory Visit (HOSPITAL_COMMUNITY): Payer: Self-pay

## 2022-01-13 ENCOUNTER — Telehealth: Payer: Self-pay | Admitting: *Deleted

## 2022-01-13 NOTE — Telephone Encounter (Signed)
Returned patients call about breast reduction surgery. LVM letting her know that she would need to call the main number to schedule a consult for Breast Reduction surgery before any insurance auth process could begin.

## 2022-01-17 ENCOUNTER — Other Ambulatory Visit (HOSPITAL_COMMUNITY): Payer: Self-pay

## 2022-01-18 ENCOUNTER — Other Ambulatory Visit (HOSPITAL_COMMUNITY): Payer: Self-pay

## 2022-01-18 ENCOUNTER — Encounter: Payer: BC Managed Care – PPO | Admitting: Physician Assistant

## 2022-01-18 MED ORDER — LINZESS 145 MCG PO CAPS
145.0000 ug | ORAL_CAPSULE | Freq: Every day | ORAL | 6 refills | Status: DC
  Filled 2022-01-18 – 2022-07-12 (×2): qty 30, 30d supply, fill #0

## 2022-01-19 ENCOUNTER — Other Ambulatory Visit (HOSPITAL_COMMUNITY): Payer: Self-pay

## 2022-01-19 ENCOUNTER — Encounter: Payer: BC Managed Care – PPO | Admitting: Student

## 2022-01-20 ENCOUNTER — Ambulatory Visit (INDEPENDENT_AMBULATORY_CARE_PROVIDER_SITE_OTHER): Payer: BC Managed Care – PPO | Admitting: Student

## 2022-01-20 DIAGNOSIS — Z9889 Other specified postprocedural states: Secondary | ICD-10-CM

## 2022-01-20 NOTE — Progress Notes (Signed)
Sandra Bird is a 51 year old female who underwent panniculectomy with upper abdominal liposuction, plication and transposition of the umbilicus with Dr. Erin Hearing on 12/23/2021.  Sandra Bird presents to the clinic today for postoperative follow-up.  Sandra Bird was last seen in the clinic on 01/11/2022.  At this visit, Sandra Bird reported she is doing okay.  She reported that there is a copious amount of drainage coming from her incisional wounds.  On exam, there was some swelling but no large seroma palpated.  There were multiple incisional wounds with a moderate amount of serosanguineous drainage.  There is a 1 x 2.5 x 0.5 cm wound on the left side that was nondraining.  There is a larger 2.5 x 0.5 x 0.5 cm wound on the right side with moderate amount of drainage.  And then there was a 1 x 0.5 x 0.5 wound at the midline that was mildly draining.  Plan was for Sandra Bird to apply calcium alginate dressings followed by gauze, ABD pads and secured with Medipore tape.  Plan is for Sandra Bird to follow-up in 2 weeks.  Today, Sandra Bird reports she is doing well.  She states that she feels the drainage from her wounds is a little bit better, but states the middle and right wounds are still draining.  She states that the drainage is still a light pink color.  She denies any purulent drainage.  She denies any fevers or chills.  She denies any purulent drainage.  She denies any nausea or vomiting.  She states that she has been voiding and having bowel movements without issue.  Chaperone present on exam.  On exam, Sandra Bird is sitting upright in no acute distress.  Her abdomen is soft and nontender.  There is some minor swelling noted, but there are no significant fluid collections noted to palpation.  Umbilicus appears viable.  There are some suture knots noted to the umbilicus.  These were cut and removed.  Sandra Bird tolerated well.  There is a small 1 cm x 1 cm superficial wound to the left aspect of the incision.  There is no surrounding  swelling, erythema or drainage noted.  There is a small 1 cm x 0.5 cm wound at midline.  There is no drainage noted at the visit today.  There is no surrounding erythema or swelling noted.  There is a 2 cm x 0.5 cm wound noted to the right lateral aspect of the abdomen.  There appears to be some hypergranulation tissue in this wound.  There is some serosanguineous drainage draining from the wound.  There is no surrounding erythema or swelling.  A small amount of silver nitrate was applied to the area of hypergranulation tissue.  Sandra Bird tolerated well.  I talked with the Sandra Bird that she can continue to use calcium alginate and dressings to the right lateral wound and to the midline wound.  I discussed with the Sandra Bird that she could put a small amount of Vaseline to the wound to her left abdomen.  I discussed with the Sandra Bird that she should continue compression, and continue to avoid strenuous activities.  Sandra Bird expressed understanding.  Sandra Bird to follow-up in 2 weeks.  I instructed the Sandra Bird to call if she has any questions or concerns.  Dr. Erin Hearing also had the opportunity to examine the Sandra Bird.

## 2022-01-21 NOTE — Progress Notes (Deleted)
Patient is a 51 year old female who underwent panniculectomy with upper abdominal liposuction, plication, and transposition of the umbilicus with Dr. Erin Hearing on 12/23/2021.  Patient presents to the clinic for postoperative follow-up.  She was last seen here in clinic on 01/20/2022.  At that time, small amount of hypergranulation tissue was cauterized with silver nitrate.  Patient was advised to continue using calcium alginate dressings to the small wound on right lateral aspect of incision as well as midline.  Advised continued compressive garments and activity restrictions.  Today,

## 2022-01-24 ENCOUNTER — Ambulatory Visit: Payer: BC Managed Care – PPO | Admitting: Physician Assistant

## 2022-01-24 ENCOUNTER — Other Ambulatory Visit (HOSPITAL_COMMUNITY): Payer: Self-pay

## 2022-01-25 ENCOUNTER — Telehealth: Payer: Self-pay | Admitting: Family Medicine

## 2022-01-25 NOTE — Telephone Encounter (Signed)
I have attempted without success to contact this patient by phone to PA that she came in the office to talk a  bout.

## 2022-01-25 NOTE — Telephone Encounter (Signed)
Pt asking to speak w/ someone about prior auth for   Rx #: [id number]  [facility] [address], [place] [address]  Phone:  [phone]  Fax:  [phone]  DEA #:  [id number]

## 2022-01-28 ENCOUNTER — Other Ambulatory Visit (HOSPITAL_COMMUNITY): Payer: Self-pay

## 2022-01-31 ENCOUNTER — Encounter: Payer: Self-pay | Admitting: Plastic Surgery

## 2022-01-31 ENCOUNTER — Ambulatory Visit: Payer: BC Managed Care – PPO | Admitting: Plastic Surgery

## 2022-01-31 VITALS — BP 156/96 | HR 96 | Ht 64.0 in | Wt 242.0 lb

## 2022-01-31 DIAGNOSIS — N62 Hypertrophy of breast: Secondary | ICD-10-CM | POA: Diagnosis not present

## 2022-01-31 DIAGNOSIS — M542 Cervicalgia: Secondary | ICD-10-CM | POA: Diagnosis not present

## 2022-01-31 DIAGNOSIS — N6481 Ptosis of breast: Secondary | ICD-10-CM

## 2022-01-31 DIAGNOSIS — M546 Pain in thoracic spine: Secondary | ICD-10-CM | POA: Diagnosis not present

## 2022-01-31 DIAGNOSIS — Z6841 Body Mass Index (BMI) 40.0 and over, adult: Secondary | ICD-10-CM

## 2022-01-31 NOTE — Progress Notes (Signed)
Referring Provider Dorna Mai, MD [place],  [place] [id number]   CC:  Chief Complaint  Patient presents with   Consult      Sandra Bird is an 51 y.o. female.  HPI: Ms. Sandra Bird is a 51 year old female who is a prior patient of the clinic.  She presents today for evaluation for possible breast reduction.  She has previously undergone a an abdominoplasty from which she is doing well despite having which she consider to be excess drainage after the procedure.  She currently denies any drainage from her incisions and notes no particular issues.  Guards to her breast she has had ongoing upper back and neck pain which she feels is due to the large size of her breast.  She denies any other particular problems with her breast.  Allergies  Allergen Reactions   Penicillins Rash and Other (See Comments)    Has patient had a PCN reaction causing immediate rash, facial/tongue/throat swelling, SOB or lightheadedness with hypotension: Yes Has patient had a PCN reaction causing severe rash involving mucus membranes or skin necrosis: No Has patient had a PCN reaction that required hospitalization No Has patient had a PCN reaction occurring within the last 10 years: No If all of the above answers are "NO", then may proceed with Cephalosporin use.     Outpatient Encounter Medications as of 01/31/2022  Medication Sig   Boric Acid Vaginal (AZO BORIC ACID) 600 MG SUPP Compounded Boric Acid 600mg  vaginal capsules  Boric Acid 600mg  vaginal capsules.  DO NOT TAKE ORALLY/TOXIC  insert 1 suppository weekly x 6 weeks; then use prn   celecoxib (CELEBREX) 200 MG capsule Take 1 capsule by mouth once a day with food as needed for pain   celecoxib (CELEBREX) 50 MG capsule Take by mouth.   clobetasol cream (TEMOVATE) 0.05 % Apply to affected area two times daily   clotrimazole-betamethasone (LOTRISONE) cream APPLY A THIN COAT DAILY AS NEEDED   Crisaborole (EUCRISA) 2 % OINT Use 1  (one) Application two times daily   latanoprost (XALATAN) 0.005 % ophthalmic solution Place 1 drop into both eyes nightly.   levofloxacin (LEVAQUIN) 500 MG tablet    linaclotide (LINZESS) 145 MCG CAPS capsule Take 1 capsule (145 mcg total) by mouth daily. Before the first meal of the day on and empty stomach.   Liraglutide -Weight Management (SAXENDA) 18 MG/3ML SOPN Inject 3 mg under the skin daily   Lorcaserin HCl 10 MG TABS TK 1 T PO  BID   medroxyPROGESTERone (PROVERA) 10 MG tablet    phentermine 37.5 MG capsule Take 1 capsule (37.5 mg total) by mouth daily.   polyethylene glycol-electrolytes (NULYTELY) 420 g solution See admin instructions.   [DISCONTINUED] albuterol (VENTOLIN HFA) 108 (90 Base) MCG/ACT inhaler  (Patient not taking: Reported on 01/31/2022)   [DISCONTINUED] aspirin EC 81 MG tablet Take by mouth. (Patient not taking: Reported on 01/31/2022)   [DISCONTINUED] atorvastatin (LIPITOR) 20 MG tablet Take 1 tablet by mouth daily. (Patient not taking: Reported on 01/31/2022)   [DISCONTINUED] atovaquone-proguanil (MALARONE) 250-100 MG TABS tablet  (Patient not taking: Reported on 01/31/2022)   [DISCONTINUED] baclofen (LIORESAL) 20 MG tablet  (Patient not taking: Reported on 01/31/2022)   [DISCONTINUED] Capsaicin-Menthol-Methyl Sal (DENDRACIN NEURODENDRAXCIN) 0.025-10-30 % LOTN APPLY SMALL AMOUNT TO THE AFFECTED AREA UP TO THREE TIMES DAILY AS NEEDED FOR PAIN - DO NOT USE THE SAME TIME AS PENNSAID (Patient not taking: Reported on 01/31/2022)   [DISCONTINUED] diclofenac (CATAFLAM) 50  MG tablet take 1 tablet by mouth once daily with food for 3 weeks then if needed (Patient not taking: Reported on 01/31/2022)   [DISCONTINUED] Diclofenac Sodium 1.5 % SOLN APPLY SMALL AMOUNT (ABOUT 10 DROPS) TO THE AFFECTED AREA TWICE DAILY AS NEEDED FOR PAIN - DO NOT USE THE SAME TIME AS DENDRACIN (Patient not taking: Reported on 01/31/2022)   [DISCONTINUED] Diethylpropion HCl CR 75 MG TB24 Take 1 tablet by mouth daily.  (Patient not taking: Reported on 01/31/2022)   [DISCONTINUED] doxycycline (VIBRA-TABS) 100 MG tablet  (Patient not taking: Reported on 01/31/2022)   [DISCONTINUED] famotidine (PEPCID) 20 MG tablet TAKE 1 TABLET BY MOUTH EVERY EVENING AT BEDTIME (Patient not taking: Reported on 01/31/2022)   [DISCONTINUED] HYDROcodone-acetaminophen (NORCO/VICODIN) 5-325 MG tablet TAKE 1-2 TABLETS BY MOUTH EVERY 6 HOURS AS NEEDED FOR MODERATE OR SEVERE PAIN (POST OP PAIN). (Patient not taking: Reported on 01/31/2022)   [DISCONTINUED] linaclotide (LINZESS) 145 MCG CAPS capsule TAKE 1 CAPSULE BY MOUTH AT LEAST 30 MINUTES BEFORE THE FIRST MEAL OF THE DAY ON AN EMPTY STOMACH   [DISCONTINUED] methocarbamol (ROBAXIN) 750 MG tablet  (Patient not taking: Reported on 01/31/2022)   [DISCONTINUED] metroNIDAZOLE (FLAGYL) 500 MG tablet TAKE 4 TABLETS BY MOUTH AS 1 DOSE (Patient not taking: Reported on 01/31/2022)   [DISCONTINUED] Multiple Vitamins-Minerals (CENTRUM ADULT PO) Centrum (Patient not taking: Reported on 01/31/2022)   [DISCONTINUED] naproxen (NAPROSYN) 500 MG tablet  (Patient not taking: Reported on 01/31/2022)   [DISCONTINUED] nystatin-triamcinolone (MYCOLOG II) cream APPLY TO THE AFFECTED AREA(S) BY TOPICAL ROUTE 2 TIMES PER DAY IN [facility] AND EVENING (Patient not taking: Reported on 01/31/2022)   [DISCONTINUED] ofloxacin (OCUFLOX) 0.3 % ophthalmic solution INSTILL 5-10 DROPS TO EYES TWICE DAILY AS DIRECTED. (Patient not taking: Reported on 01/31/2022)   [DISCONTINUED] ondansetron (ZOFRAN-ODT) 4 MG disintegrating tablet Dissolve 1 tablet (4 mg total) by mouth every 8 (eight) hours as needed for nausea or vomiting. (Patient not taking: Reported on 01/31/2022)   [DISCONTINUED] pantoprazole (PROTONIX) 40 MG tablet Take 1 tablet by mouth once a day (Patient not taking: Reported on 01/31/2022)   [DISCONTINUED] Vitamin D, Ergocalciferol, 50000 units CAPS Take 1 capsule by mouth once a week. (Patient not taking: Reported on 01/31/2022)    No facility-administered encounter medications on file as of 01/31/2022.     Past Medical History:  Diagnosis Date   Arthritis    SVD (spontaneous vaginal delivery)    x 3    Past Surgical History:  Procedure Laterality Date   CHOLECYSTECTOMY     DILITATION & CURRETTAGE/HYSTROSCOPY WITH NOVASURE ABLATION N/A 07/25/2013   Procedure: DILATATION & CURETTAGE/HYSTEROSCOPY WITH NOVASURE ABLATION;  Surgeon: Gus Height, MD;  Location: [facility];  Service: Gynecology;  Laterality: N/A;   TONSILLECTOMY     TOTAL HIP ARTHROPLASTY Right 03/24/2020   Procedure: RIGHT TOTAL HIP ARTHROPLASTY ANTERIOR APPROACH;  Surgeon: Melrose Nakayama, MD;  Location: [facility];  Service: Orthopedics;  Laterality: Right;   TUBAL LIGATION     WISDOM TOOTH EXTRACTION      Family History  Problem Relation Age of Onset   Heart failure Mother    Heart attack Father     Social History   Social History Narrative   Not on file     Review of Systems General: Denies fevers, chills, weight loss CV: Denies chest pain, shortness of breath, palpitations Musculoskeletal: Upper back and neck pain Breasts: Pendulous breasts without complaints of nipple discharge masses or nipple abnormalities.  Physical Exam    01/31/2022  10:43 AM 12/14/2021   11:07 AM 12/06/2021   10:48 AM  Vitals with BMI  Height 5\' 4"  5\' 4"  5\' 4"   Weight 242 lbs 250 lbs 10 oz 246 lbs  BMI 41.52 20.94 70.96  Systolic 283 662 947  Diastolic 96 82 74  Pulse 96 77 95    General:  No acute distress,  Alert and oriented, Non-Toxic, Normal speech and affect BMI 41.5, BSA 2.23. Breast: The patient has bilateral grade 3 ptosis.  Her sternal notch to nipple distance on the right is 38 cm sternal notch to nipple distance on the left is 37 cm fold to nipple distance on the left is 15 cm.  Breast exam is unremarkable with no dominant masses no nipple discharge no nipple retraction.  Assessment/Plan Symptomatic macromastia, breast ptosis: Patient has  bilateral breast ptosis the left breast is larger than the right however on my physical exam I suspect that I will be able to remove at most 400 g per breast.  I discussed both breast reduction and mastopexy with the patient.  As both are done in a similar manner I showed her where the scars would be in the circumareolar position in the vertical and horizontal positions on the bottom side of the breast and in the inframammary fold.  We discussed the risks of bleeding, infection, seroma formation.  We discussed the unpredictable nature of scarring.  We discussed the possibility of nipple loss due to ischemia, and the possibility of changes in sensation in the nipple.  Discussed the use of drains which would remain overnight,  and the need for early ambulation.  I will submit the case for consideration of a bilateral breast reduction.  If this is not successful she will return and we will discuss the possibility of a of an aesthetic mastopexy.  She would like to defer surgery until after the first of the year.  I agree with this as she should be at least 3 months postop from her abdominoplasty prior to her breast surgery.  Sandra Bird 01/31/2022, 11:10 AM

## 2022-02-01 ENCOUNTER — Telehealth: Payer: Self-pay

## 2022-02-01 NOTE — Telephone Encounter (Signed)
Faxed release of information form to [facility] to obtain mammogram results; received success confirmation.

## 2022-02-02 ENCOUNTER — Other Ambulatory Visit (HOSPITAL_COMMUNITY): Payer: Self-pay

## 2022-02-02 ENCOUNTER — Ambulatory Visit (INDEPENDENT_AMBULATORY_CARE_PROVIDER_SITE_OTHER): Payer: BC Managed Care – PPO | Admitting: Family Medicine

## 2022-02-02 VITALS — BP 116/82 | HR 79 | Temp 98.1°F | Resp 16 | Wt 242.6 lb

## 2022-02-02 DIAGNOSIS — Z114 Encounter for screening for human immunodeficiency virus [HIV]: Secondary | ICD-10-CM

## 2022-02-02 DIAGNOSIS — Z130 Encounter for screening for diseases of the blood and blood-forming organs and certain disorders involving the immune mechanism: Secondary | ICD-10-CM | POA: Diagnosis not present

## 2022-02-02 DIAGNOSIS — Z13220 Encounter for screening for lipoid disorders: Secondary | ICD-10-CM

## 2022-02-02 DIAGNOSIS — Z1329 Encounter for screening for other suspected endocrine disorder: Secondary | ICD-10-CM | POA: Diagnosis not present

## 2022-02-02 DIAGNOSIS — Z0000 Encounter for general adult medical examination without abnormal findings: Secondary | ICD-10-CM | POA: Diagnosis not present

## 2022-02-02 DIAGNOSIS — Z13228 Encounter for screening for other metabolic disorders: Secondary | ICD-10-CM

## 2022-02-02 DIAGNOSIS — Z1159 Encounter for screening for other viral diseases: Secondary | ICD-10-CM

## 2022-02-02 MED ORDER — EUCRISA 2 % EX OINT: TOPICAL_OINTMENT | CUTANEOUS | 1 refills | Status: DC

## 2022-02-03 ENCOUNTER — Ambulatory Visit (INDEPENDENT_AMBULATORY_CARE_PROVIDER_SITE_OTHER): Payer: BC Managed Care – PPO | Admitting: Plastic Surgery

## 2022-02-03 ENCOUNTER — Other Ambulatory Visit (HOSPITAL_COMMUNITY): Payer: Self-pay

## 2022-02-03 ENCOUNTER — Other Ambulatory Visit (HOSPITAL_COMMUNITY)
Admission: RE | Admit: 2022-02-03 | Discharge: 2022-02-03 | Disposition: A | Discharge status: Home / Self Care | Payer: BC Managed Care – PPO | Attending: Plastic Surgery | Admitting: Plastic Surgery

## 2022-02-03 ENCOUNTER — Encounter: Payer: Self-pay | Admitting: Family Medicine

## 2022-02-03 DIAGNOSIS — Z9889 Other specified postprocedural states: Secondary | ICD-10-CM | POA: Insufficient documentation

## 2022-02-03 MED ORDER — DOXYCYCLINE HYCLATE 100 MG PO TABS
100.0000 mg | ORAL_TABLET | Freq: Two times a day (BID) | ORAL | 0 refills | Status: DC
  Filled 2022-02-03: qty 14, 7d supply, fill #0

## 2022-02-03 NOTE — Progress Notes (Signed)
Operative Note   DATE OF OPERATION: 02/03/2022  LOCATION:  Office  SURGICAL DEPARTMENT: Plastic Surgery  PREOPERATIVE DIAGNOSES: Small abscess or suture abscess left of center abdomen incision  POSTOPERATIVE DIAGNOSES:  same  PROCEDURE:  I&D of small abscess left of center abdomen  SURGEON: Melene Plan. Deondrea Aguado, MD  ANESTHESIA:  Local  COMPLICATIONS: None.   INDICATIONS FOR PROCEDURE:  The patient, Sandra Bird is a 51 y.o. female born on [birth year], is here for treatment of small postoperative abscess along incision MRN: [id number]  CONSENT:  Informed consent was obtained directly from the patient. Risks, benefits and alternatives were fully discussed. Specific risks including but not limited to bleeding, infection, hematoma, seroma, scarring, pain, infection, wound healing problems, and need for further surgery were all discussed. The patient did have an ample opportunity to have questions answered to satisfaction.   DESCRIPTION OF PROCEDURE:  Local anesthesia was administered. The patient's operative site was prepped and draped in a sterile fashion. A time out was performed and all information was confirmed to be correct.  The patient tolerated the procedure well.  The area of concern was injected with local anesthetic 1% lidocaine with epinephrine.  A scissor was used to spread the area and a small amount of seropurulent drainage was expelled.  This was cultured.  A packing strip was then placed in the wound.  Mepilex border was placed over this.  There were no complications.

## 2022-02-03 NOTE — Progress Notes (Signed)
Established Patient Office Visit  Subjective    Patient ID: Sandra Bird, female    DOB: 1970-07-19  Age: 51 y.o. MRN: [id number]  CC: No chief complaint on file.   HPI [name] presents for routine annual exam. Patient denies acute complaints or concerns.    Outpatient Encounter Medications as of 02/02/2022  Medication Sig   Boric Acid Vaginal (AZO BORIC ACID) 600 MG SUPP Compounded Boric Acid 620m vaginal capsules  Boric Acid 605mvaginal capsules.  DO NOT TAKE ORALLY/TOXIC  insert 1 suppository weekly x 6 weeks; then use prn   celecoxib (CELEBREX) 200 MG capsule Take 1 capsule by mouth once a day with food as needed for pain   celecoxib (CELEBREX) 50 MG capsule Take by mouth. (Patient not taking: Reported on 02/03/2022)   clobetasol cream (TEMOVATE) 0.05 % Apply to affected area two times daily   clotrimazole-betamethasone (LOTRISONE) cream APPLY A THIN COAT DAILY AS NEEDED   Crisaborole (EUCRISA) 2 % OINT Use 1 (one) Application two times daily   latanoprost (XALATAN) 0.005 % ophthalmic solution Place 1 drop into both eyes nightly.   levofloxacin (LEVAQUIN) 500 MG tablet  (Patient not taking: Reported on 02/03/2022)   linaclotide (LINZESS) 145 MCG CAPS capsule Take 1 capsule (145 mcg total) by mouth daily. Before the first meal of the day on and empty stomach.   Liraglutide -Weight Management (SAXENDA) 18 MG/3ML SOPN Inject 3 mg under the skin daily   Lorcaserin HCl 10 MG TABS TK 1 T PO  BID (Patient not taking: Reported on 02/03/2022)   medroxyPROGESTERone (PROVERA) 10 MG tablet    phentermine 37.5 MG capsule Take 1 capsule (37.5 mg total) by mouth daily.   polyethylene glycol-electrolytes (NULYTELY) 420 g solution See admin instructions. (Patient not taking: Reported on 02/03/2022)   [DISCONTINUED] Crisaborole (EUCRISA) 2 % OINT Apply topically two times daily as directed   No facility-administered encounter medications on file as of 02/02/2022.    Past  Medical History:  Diagnosis Date   Arthritis    SVD (spontaneous vaginal delivery)    x 3    Past Surgical History:  Procedure Laterality Date   CHOLECYSTECTOMY     DILITATION & CURRETTAGE/HYSTROSCOPY WITH NOVASURE ABLATION N/A 07/25/2013   Procedure: DILATATION & CURETTAGE/HYSTEROSCOPY WITH NOVASURE ABLATION;  Surgeon: AlGus HeightMD;  [facility];  Service: Gynecology;  Laterality: N/A;   TONSILLECTOMY     TOTAL HIP ARTHROPLASTY Right 03/24/2020   Procedure: RIGHT TOTAL HIP ARTHROPLASTY ANTERIOR APPROACH;  Surgeon: DaMelrose NakayamaMD;  Location: [facility];  Service: Orthopedics;  Laterality: Right;   TUBAL LIGATION     WISDOM TOOTH EXTRACTION      Family History  Problem Relation Age of Onset   Heart failure Mother    Heart attack Father     Social History   Socioeconomic History   Marital status: Single    Spouse name: Not on file   Number of children: Not on file   Years of education: Not on file   Highest education level: Not on file  Occupational History   Not on file  Tobacco Use   Smoking status: Never   Smokeless tobacco: Never  Vaping Use   Vaping Use: Never used  Substance and Sexual Activity   Alcohol use: Yes    Comment: socially   Drug use: No   Sexual activity: Yes    Birth control/protection: Surgical  Other Topics Concern   Not on file  Social History Narrative   Not on file   Social Determinants of Health   Financial Resource Strain: Not on file  Food Insecurity: Not on file  Transportation Needs: Not on file  Physical Activity: Not on file  Stress: Not on file  Social Connections: Not on file  Intimate Partner Violence: Not on file    Review of Systems  All other systems reviewed and are negative.       Objective    BP 116/82   Pulse 79   Temp 98.1 F (36.7 C) (Oral)   Resp 16   Wt 242 lb 9.6 oz (110 kg)   SpO2 98%   BMI 41.64 kg/m   Physical Exam Vitals and nursing note reviewed.  Constitutional:      General:  She is not in acute distress.    Appearance: She is obese.  HENT:     Head: Normocephalic and atraumatic.     Right Ear: Tympanic membrane, ear canal and external ear normal.     Left Ear: Tympanic membrane, ear canal and external ear normal.     Nose: Nose normal.     Mouth/Throat:     Mouth: Mucous membranes are moist.     Pharynx: Oropharynx is clear.  Eyes:     Conjunctiva/sclera: Conjunctivae normal.     Pupils: Pupils are equal, round, and reactive to light.  Neck:     Thyroid: No thyromegaly.  Cardiovascular:     Rate and Rhythm: Normal rate and regular rhythm.     Heart sounds: Normal heart sounds. No murmur heard. Pulmonary:     Effort: Pulmonary effort is normal. No respiratory distress.     Breath sounds: Normal breath sounds.  Abdominal:     General: There is no distension.     Palpations: Abdomen is soft. There is no mass.     Tenderness: There is no abdominal tenderness.  Musculoskeletal:        General: Normal range of motion.     Cervical back: Normal range of motion and neck supple.  Skin:    General: Skin is warm and dry.  Neurological:     General: No focal deficit present.     Mental Status: She is alert and oriented to person, place, and time.  Psychiatric:        Mood and Affect: Mood normal.        Behavior: Behavior normal.         Assessment & Plan:   1. Annual physical exam  - CMP14+EGFR  2. Screening for deficiency anemia  - CBC with Differential  3. Screening for lipid disorders  - Lipid Panel  4. Screening for endocrine/metabolic/immunity disorders  - Vitamin D, 25-hydroxy - TSH - Hemoglobin A1c  5. Screening for HIV (human immunodeficiency virus)  - HIV antibody (with reflex)  6. Need for hepatitis C screening test  - Hepatitis C Antibody    No follow-ups on file.   [name], MD

## 2022-02-05 ENCOUNTER — Other Ambulatory Visit (HOSPITAL_COMMUNITY): Payer: Self-pay

## 2022-02-07 ENCOUNTER — Other Ambulatory Visit (HOSPITAL_COMMUNITY): Payer: Self-pay

## 2022-02-07 ENCOUNTER — Ambulatory Visit (INDEPENDENT_AMBULATORY_CARE_PROVIDER_SITE_OTHER): Payer: BC Managed Care – PPO | Admitting: Student

## 2022-02-07 ENCOUNTER — Other Ambulatory Visit: Payer: Self-pay | Admitting: Family Medicine

## 2022-02-07 DIAGNOSIS — Z9889 Other specified postprocedural states: Secondary | ICD-10-CM

## 2022-02-07 MED ORDER — CIPROFLOXACIN HCL 500 MG PO TABS
500.0000 mg | ORAL_TABLET | Freq: Two times a day (BID) | ORAL | 0 refills | Status: AC
  Filled 2022-02-07: qty 14, 7d supply, fill #0

## 2022-02-07 NOTE — Progress Notes (Signed)
Patient is a 51 year old female who underwent panniculectomy with upper abdominal liposuction, plication and transposition of the umbilicus with Dr. Erin Hearing on 12/23/2021.  Patient saw Dr. Erin Hearing on 02/03/2022.  At this visit, patient was found to have a small abscess or suture abscess just left of midline to her abdominal incision.  Patient underwent I&D of the small abscess with Dr. Erin Hearing on 02/03/2022.  The drainage was cultured and the wound was packed.  Patient was placed on doxycycline.    Preliminary culture results show abundant E. coli.  Today, patient reports she is doing well.  She denies any issues with the procedure site.  She states that she changed the packing on Saturday without issue.  She denies any drainage from the area or redness surrounding it.  She denies any fevers or chills.  She has no other issues or complaints at this time.  I discussed the results of the culture with the patient.  Chaperone present on exam.  On exam, patient is sitting upright in no acute distress.  Abdomen is soft and nontender.  Packing is noted within the wound to the midline of the incision.  Packing was removed.  The wound is approximately 2 cm x 1 cm x 0.5 cm.  The wound bed appears healthy and appears to be healing well.  There is no purulent drainage or surrounding erythema.  There is a small pinpoint wound noted to the left lateral aspect of the abdominal incision.  There is no drainage or redness surrounding this.  Incision is otherwise intact and healing well.  The wound to midline was repacked with packing strip.  Gauze and Medipore tape were placed over it.  I discussed with the patient that she can repack the wound on Wednesday and cover with gauze and tape.  I discussed with the patient that she can come in on Friday and we can take the packing out to evaluate the wound and to assess if she needs to continue packing.  Patient expressed understanding and agreed.   I discussed with the  patient that we will switch her over to another antibiotic that is susceptible to E. coli.  We will plan to send ciprofloxacin 500 mg every 12 hours for 1 week. I discussed with the patient to stop her doxycycline.   I discussed the plan with Dr. Erin Hearing.   I instructed the patient in the meantime to call us if she has any questions or concerns.  I discussed with her that she should call us if the area becomes more red, has increased drainage or if she develops fevers or chills.  Patient expressed understanding.  Patient to follow up on Friday  Pictures were obtained of the patient and placed in the chart with the patient's or guardian's permission.

## 2022-02-08 ENCOUNTER — Other Ambulatory Visit (HOSPITAL_COMMUNITY): Payer: Self-pay

## 2022-02-08 LAB — HIV ANTIBODY (ROUTINE TESTING W REFLEX): HIV Screen 4th Generation wRfx: NONREACTIVE

## 2022-02-08 LAB — CMP14+EGFR
ALT: 11 IU/L (ref 0–32)
AST: 16 IU/L (ref 0–40)
Albumin/Globulin Ratio: 1.8 (ref 1.2–2.2)
Albumin: 4.6 g/dL (ref 3.8–4.9)
Alkaline Phosphatase: 84 IU/L (ref 44–121)
BUN/Creatinine Ratio: 14 (ref 9–23)
BUN: 13 mg/dL (ref 6–24)
Bilirubin Total: 0.3 mg/dL (ref 0.0–1.2)
CO2: 26 mmol/L (ref 20–29)
Calcium: 9.7 mg/dL (ref 8.7–10.2)
Chloride: 104 mmol/L (ref 96–106)
Creatinine, Ser: 0.95 mg/dL (ref 0.57–1.00)
Globulin, Total: 2.6 g/dL (ref 1.5–4.5)
Glucose: 76 mg/dL (ref 70–99)
Potassium: 4.1 mmol/L (ref 3.5–5.2)
Sodium: 144 mmol/L (ref 134–144)
Total Protein: 7.2 g/dL (ref 6.0–8.5)
eGFR: 72 mL/min/1.73

## 2022-02-08 LAB — LIPID PANEL W/O CHOL/HDL RATIO
Cholesterol, Total: 226 mg/dL — ABNORMAL HIGH (ref 100–199)
HDL: 67 mg/dL (ref >39)
LDL Chol Calc (NIH): 135 mg/dL — ABNORMAL HIGH (ref 0–99)
Triglycerides: 139 mg/dL (ref 0–149)
VLDL Cholesterol Cal: 24 mg/dL (ref 5–40)

## 2022-02-08 LAB — CBC WITH DIFFERENTIAL/PLATELET
Basophils Absolute: 0 10*3/uL (ref 0.0–0.2)
Basos: 1 %
EOS (ABSOLUTE): 0.2 10*3/uL (ref 0.0–0.4)
Eos: 3 %
Hematocrit: 37.8 % (ref 34.0–46.6)
Hemoglobin: 11.8 g/dL (ref 11.1–15.9)
Immature Grans (Abs): 0 10*3/uL (ref 0.0–0.1)
Immature Granulocytes: 0 %
Lymphocytes Absolute: 2.5 10*3/uL (ref 0.7–3.1)
Lymphs: 46 %
MCH: 26.5 pg — ABNORMAL LOW (ref 26.6–33.0)
MCHC: 31.2 g/dL — ABNORMAL LOW (ref 31.5–35.7)
MCV: 85 fL (ref 79–97)
Monocytes Absolute: 0.3 10*3/uL (ref 0.1–0.9)
Monocytes: 6 %
Neutrophils Absolute: 2.4 10*3/uL (ref 1.4–7.0)
Neutrophils: 44 %
Platelets: 326 10*3/uL (ref 150–450)
RBC: 4.45 x10E6/uL (ref 3.77–5.28)
RDW: 13.1 % (ref 11.7–15.4)
WBC: 5.4 10*3/uL (ref 3.4–10.8)

## 2022-02-08 LAB — HEPATITIS C ANTIBODY: Hep C Virus Ab: NONREACTIVE

## 2022-02-08 LAB — TSH: TSH: 1.19 u[IU]/mL (ref 0.450–4.500)

## 2022-02-08 LAB — HGB A1C W/O EAG: Hgb A1c MFr Bld: 5.4 % (ref 4.8–5.6)

## 2022-02-08 LAB — VITAMIN D 25 HYDROXY (VIT D DEFICIENCY, FRACTURES): Vit D, 25-Hydroxy: 44.2 ng/mL (ref 30.0–100.0)

## 2022-02-09 LAB — AEROBIC/ANAEROBIC CULTURE W GRAM STAIN (SURGICAL/DEEP WOUND)

## 2022-02-11 ENCOUNTER — Telehealth: Payer: Self-pay | Admitting: *Deleted

## 2022-02-11 ENCOUNTER — Encounter: Payer: Self-pay | Admitting: Surgical

## 2022-02-11 ENCOUNTER — Ambulatory Visit (INDEPENDENT_AMBULATORY_CARE_PROVIDER_SITE_OTHER): Payer: BC Managed Care – PPO | Admitting: Surgical

## 2022-02-11 DIAGNOSIS — Z9889 Other specified postprocedural states: Secondary | ICD-10-CM

## 2022-02-11 DIAGNOSIS — M793 Panniculitis, unspecified: Secondary | ICD-10-CM

## 2022-02-11 NOTE — Progress Notes (Signed)
Patient is a 51 year old female who underwent panniculectomy/abdominoplasty add-on with Dr. Erin Hearing on 12/23/2021.  She is 7 weeks postop.  She did have a small abscess/suture abscess on 02/03/2022 which was drained in the office with Dr. Erin Hearing.  Culture subsequently showed E. coli and patient was placed on Cipro for 1 week.  Patient reports today that she is overall doing well.  She reports she is feeling much better after having the stitch abscess drained.  She has been on Cipro for a few days.  She is not having any infectious symptoms.  She is overall very pleased.  She reports she has continued to wear compressive garments because it feels comfortable.  Chaperone present on exam Exam bilateral abdominal incisions are intact, healing well.  She does have a small scabbing wound centrally where the stitch abscess was drained.  There is no erythema or cellulitic changes noted.  No subcutaneous fluid collection noted.  Mild tenderness of central abdomen noted with palpation.  Umbilicus is viable, incision intact healing well.  A/P:  Patient is doing well after abdominoplasty with Dr. Erin Hearing on 12/23/2021.  She is approximately 7 weeks postop.  She is not having any infectious symptoms.  Recommend completing her 1 week of ciprofloxacin, has approximately 4 more days left.  There is no signs of concern on exam today.  Recommend following up in 2 to 3 weeks for reevaluation.  All of her questions were answered to her content.  She can begin using scar cream on the lateral abdominal incisions where no wounds are present.  Pictures were obtained of the patient and placed in the chart with the patient's or guardian's permission.

## 2022-02-14 NOTE — Telephone Encounter (Signed)
Error

## 2022-02-15 ENCOUNTER — Telehealth: Payer: Self-pay | Admitting: *Deleted

## 2022-02-15 NOTE — Telephone Encounter (Signed)
Auth submitted for [id number] via [email or web address]  [id number]

## 2022-02-28 ENCOUNTER — Other Ambulatory Visit: Payer: Self-pay | Admitting: Family Medicine

## 2022-02-28 ENCOUNTER — Other Ambulatory Visit (HOSPITAL_COMMUNITY): Payer: Self-pay

## 2022-02-28 MED ORDER — PANTOPRAZOLE SODIUM 40 MG PO TBEC
40.0000 mg | DELAYED_RELEASE_TABLET | Freq: Every day | ORAL | 5 refills | Status: DC
  Filled 2022-02-28: qty 30, 30d supply, fill #0
  Filled 2022-04-04: qty 30, 30d supply, fill #1
  Filled 2022-05-04: qty 30, 30d supply, fill #2
  Filled 2022-06-13: qty 30, 30d supply, fill #3
  Filled 2022-07-12: qty 30, 30d supply, fill #4
  Filled 2022-08-24: qty 30, 30d supply, fill #5

## 2022-03-01 ENCOUNTER — Ambulatory Visit (INDEPENDENT_AMBULATORY_CARE_PROVIDER_SITE_OTHER): Payer: BC Managed Care – PPO | Admitting: Physician Assistant

## 2022-03-01 DIAGNOSIS — Z9889 Other specified postprocedural states: Secondary | ICD-10-CM

## 2022-03-01 NOTE — Telephone Encounter (Signed)
Requested medication (s) are due for refill today: For review  Requested medication (s) are on the active medication list: yes    Last refill: 06/04/21  7.35ml  3 refills  Future visit scheduled no  Notes to clinic:   Historical provider. Notes discontinued 12/14/21 due to Change in therapy, remains on current med profile, changed from 'Evening' to 'Nightly' only change noted. Please review. Thank you.  Requested Prescriptions  Pending Prescriptions Disp Refills   latanoprost (XALATAN) 0.005 % ophthalmic solution 7.5 mL 3    Sig: Place 1 drop into both eyes every evening     Ophthalmology:  Glaucoma Passed - 02/28/2022  9:58 AM      Passed - Valid encounter within last 12 months    Recent Outpatient Visits           3 weeks ago Annual physical exam   [facility], MD   2 months ago Macromastia   [facility] at [facility], Clyde Canterbury, MD   3 months ago Class 3 severe obesity due to excess calories without serious comorbidity with body mass index (BMI) of 40.0 to 44.9 in adult [facility])   [facility] Care at [facility], MD       Future Appointments             In 2 days Tawnya Crook, MD [place], [facility]

## 2022-03-01 NOTE — Progress Notes (Signed)
Patient is a very pleasant 51 year old female who underwent panniculectomy with upper abdominal liposuction, plication, and transposition of the umbilicus with Dr. Erin Hearing on 12/23/2021.  Patient presents to the clinic for postoperative follow-up.   She was last seen here in clinic on 02/11/2022.  At that time, she was feeling much improved after having an incisional abscess drained 02/03/2022 by Dr. Erin Hearing.  A culture obtained showed E. coli sensitive to ciprofloxacin and she was improved after taking those antibiotics.  There was no evidence concerning for infection at her 02/11/2022 appointment.  Plan is for application of scar cream laterally where no wounds are present and to return in 2 to 3 weeks for examination.  Today, patient is doing well.  She states that she has continued to heal nicely.  She does have what she believes to be a protruding suture from the left side of the incision that she would like to have removed.  Otherwise, no complaints and she is quite pleased with the outcome of her abdominoplasty.  Physical exam is entirely reassuring.  Abdomen is soft, nontender.  No overlying skin changes.  Incisions all CDI.  There is a single protruding stitch from the left side of the incision that is removed here in clinic without complication or difficulty.  No evidence concerning for infection.  At this point, she appears to be well-healed from a postoperative standpoint for her abdominoplasty.  Picture(s) obtained of the patient and placed in the chart were with the patient's or guardian's permission.  Recommending continued silicone scar mitigation treatments.  She then inquires about possible breast reduction surgery and states that she has been going to her nutritionist, as directed.  She has already completed PT.  Discussed the differences between a breast reduction and cosmetic mastopexy.  She also tells me that she is interested in facial rejuvenation.  We will set her up with a consult  to discuss options for her with one of our surgeons.

## 2022-03-03 ENCOUNTER — Other Ambulatory Visit (HOSPITAL_COMMUNITY): Payer: Self-pay

## 2022-03-03 ENCOUNTER — Ambulatory Visit: Payer: BC Managed Care – PPO | Admitting: Family Medicine

## 2022-03-03 ENCOUNTER — Encounter: Payer: Self-pay | Admitting: Family Medicine

## 2022-03-03 VITALS — BP 132/78 | HR 75 | Temp 98.2°F | Ht 62.0 in | Wt 241.5 lb

## 2022-03-03 DIAGNOSIS — Z6841 Body Mass Index (BMI) 40.0 and over, adult: Secondary | ICD-10-CM | POA: Diagnosis not present

## 2022-03-03 DIAGNOSIS — M1611 Unilateral primary osteoarthritis, right hip: Secondary | ICD-10-CM | POA: Diagnosis not present

## 2022-03-03 MED ORDER — PHENTERMINE HCL 37.5 MG PO CAPS
37.5000 mg | ORAL_CAPSULE | Freq: Every day | ORAL | 0 refills | Status: DC
  Filled 2022-03-03: qty 30, 30d supply, fill #0

## 2022-03-03 MED ORDER — SAXENDA 18 MG/3ML ~~LOC~~ SOPN
PEN_INJECTOR | SUBCUTANEOUS | 0 refills | Status: AC
  Filled 2022-03-03 – 2022-07-18 (×3): qty 12, 35d supply, fill #0
  Filled 2023-02-28: qty 12, 30d supply, fill #0

## 2022-03-03 MED ORDER — CELECOXIB 200 MG PO CAPS
200.0000 mg | ORAL_CAPSULE | Freq: Every day | ORAL | 1 refills | Status: DC | PRN
  Filled 2022-03-03: qty 90, 90d supply, fill #0

## 2022-03-03 NOTE — Progress Notes (Signed)
New Patient Office Visit  Subjective:  Patient ID: Sandra Bird, female    DOB: 05-03-1970  Age: 51 y.o. MRN: [id number]  CC:  Chief Complaint  Patient presents with   Establish Care    Need new pcp Fasting      HPI Sandra Bird presents for new pt.    Had annual on 02/02/22.  Changing docs.  Labs-concerned w/chol.  3 mo out from abdominoplasty so will restart exercise Eczema hands-eucrisa. Obesity-lost 70# w/saxenda and phentermine.  Off 3 mo but wasn't working as well to suppress appetite. No SI.  If on phentermine alone, still hungry.  Added saxenda and good OA knees-has done injections.  Celebrex not working.  None of the other nsaids work either.    Past Medical History:  Diagnosis Date   Arthritis    SVD (spontaneous vaginal delivery)    x 3    Past Surgical History:  Procedure Laterality Date   ABDOMINOPLASTY/PANNICULECTOMY     CHOLECYSTECTOMY     DILITATION & CURRETTAGE/HYSTROSCOPY WITH NOVASURE ABLATION N/A 07/25/2013   Procedure: DILATATION & CURETTAGE/HYSTEROSCOPY WITH NOVASURE ABLATION;  Surgeon: Miguel Aschoff, MD;  Location: [facility];  Service: Gynecology;  Laterality: N/A;   TONSILLECTOMY     TOTAL HIP ARTHROPLASTY Right 03/24/2020   Procedure: RIGHT TOTAL HIP ARTHROPLASTY ANTERIOR APPROACH;  Surgeon: Marcene Corning, MD;  Location: [facility];  Service: Orthopedics;  Laterality: Right;   TUBAL LIGATION     WISDOM TOOTH EXTRACTION      Family History  Problem Relation Age of Onset   Diabetes Mother    Heart failure Mother    Heart attack Father 22    Social History   Socioeconomic History   Marital status: Single    Spouse name: Not on file   Number of children: 3   Years of education: Not on file   Highest education level: Not on file  Occupational History   Not on file  Tobacco Use   Smoking status: Never   Smokeless tobacco: Never  Vaping Use   Vaping Use: Never used  Substance and Sexual Activity   Alcohol use: Yes    Comment:  socially   Drug use: No   Sexual activity: Yes    Birth control/protection: Surgical  Other Topics Concern   Not on file  Social History Narrative   Retired-teacher.  Disabled from hip   Social Determinants of Health   Financial Resource Strain: Not on file  Food Insecurity: No Food Insecurity (03/03/2022)   Hunger Vital Sign    Worried About Running Out of Food in the Last Year: Never true    Ran Out of Food in the Last Year: Never true  Transportation Needs: Not on file  Physical Activity: Not on file  Stress: Not on file  Social Connections: Not on file  Intimate Partner Violence: Not on file    ROS  ROS: Gen: no fever, chills  Skin: eczema ENT: no ear pain, ear drainage, nasal congestion, rhinorrhea, sinus pressure, sore throat Eyes: no blurry vision, double vision Resp: no cough, wheeze,SOB CV: no CP, palpitations, LE edema,  GI: no heartburn, n/v/d/c, abd pain GU: no dysuria, urgency, frequency, hematuria MSK: HPI Neuro: no dizziness, headache, weakness, vertigo Psych: no depression, anxiety, insomnia, SI   Objective:   Today's Vitals: BP 132/78   Pulse 75   Temp 98.2 F (36.8 C) (Temporal)   Ht 5\' 2"  (1.575 m)   Wt 241 lb 8 oz (109.5  kg)   SpO2 98%   BMI 44.17 kg/m   Physical Exam  Gen: WDWN NAD MOAAF HEENT: NCAT, conjunctiva not injected, sclera nonicteric  NECK:  supple, no thyromegaly, no nodes, no carotid bruits CARDIAC: RRR, S1S2+, no murmur. DP 2+B LUNGS: CTAB. No wheezes ABDOMEN:  BS+, soft, NTND, No HSM, no masses EXT:  no edema MSK: limps  NEURO: A&O x3.  CN II-XII intact.  PSYCH: normal mood. Good eye contact   Reviewed chart/labs  Assessment & Plan:   Problem List Items Addressed This Visit       Musculoskeletal and Integument   Primary osteoarthritis of right hip - Primary   Relevant Medications   celecoxib (CELEBREX) 200 MG capsule   Other Relevant Orders   Ambulatory referral to Physical Therapy   Other Visit Diagnoses      Morbid obesity with BMI of 40.0-44.9, adult (HCC)       Relevant Medications   Liraglutide -Weight Management (SAXENDA) 18 MG/3ML SOPN   phentermine 37.5 MG capsule     1.  Osteoarthritis knees/hips-chronic.  Meds not holding.  Discussed that I do not do narcotics for long-term pain management.  Continue Celebrex 200 mg daily, add Tylenol 1000 mg 3 times daily, turmeric and ginger.  Try to keep active and and continue to work on weight loss.  She can follow-up with Ortho/sports medicine for injections if she desires (have not been working lately).  Patient requesting to go back to physical therapy and possible dry needling.  Referral placed 2.  Morbid obesity-she had success with Saxenda and phentermine.  She underwent abdominoplasty and stopped the medications.  She did not feel that they were being that effective prior to stopping.  We discussed the meds that her under approval and will be available hopefully in the next several months.  If insurance covers them they may be other alternative options.  In the meantime, will restart Saxenda-from the beginning so upward titration.  And phentermine 37.5 mg daily.  (PDMP checked) follow-up in 1 month.  Continue diet.  Discussed exercise even if 5 minutes several times a day-she has multiple limitations due to arthritis.  Outpatient Encounter Medications as of 03/03/2022  Medication Sig   Boric Acid Vaginal (AZO BORIC ACID) 600 MG SUPP Compounded Boric Acid 600mg  vaginal capsules  Boric Acid 600mg  vaginal capsules.  DO NOT TAKE ORALLY/TOXIC  insert 1 suppository weekly x 6 weeks; then use prn   clobetasol cream (TEMOVATE) 0.05 % Apply to affected area two times daily   clotrimazole-betamethasone (LOTRISONE) cream APPLY A THIN COAT DAILY AS NEEDED   Crisaborole (EUCRISA) 2 % OINT Use 1 (one) Application two times daily   latanoprost (XALATAN) 0.005 % ophthalmic solution Place 1 drop into both eyes nightly.   linaclotide (LINZESS) 145 MCG CAPS  capsule Take 1 capsule (145 mcg total) by mouth daily. Before the first meal of the day on and empty stomach.   Liraglutide -Weight Management (SAXENDA) 18 MG/3ML SOPN Inject 0.6 mg into the skin daily for 7 days, THEN 1.2 mg daily for 7 days, THEN 1.8 mg daily for 7 days, THEN 2.4 mg daily for 7 days, THEN 3 mg daily for 7 days.   pantoprazole (PROTONIX) 40 MG tablet Take 1 tablet (40 mg total) by mouth daily.   [DISCONTINUED] celecoxib (CELEBREX) 200 MG capsule Take 1 capsule by mouth once a day with food as needed for pain   [DISCONTINUED] Liraglutide -Weight Management (SAXENDA) 18 MG/3ML SOPN Inject 3 mg  under the skin daily   [DISCONTINUED] phentermine 37.5 MG capsule Take 1 capsule (37.5 mg total) by mouth daily.   celecoxib (CELEBREX) 200 MG capsule Take 1 capsule (200 mg total) by mouth daily with food as needed for pain.   phentermine 37.5 MG capsule Take 1 capsule (37.5 mg total) by mouth daily.   [DISCONTINUED] celecoxib (CELEBREX) 50 MG capsule Take by mouth. (Patient not taking: Reported on 03/03/2022)   [DISCONTINUED] Lorcaserin HCl 10 MG TABS  (Patient not taking: Reported on 03/03/2022)   [DISCONTINUED] medroxyPROGESTERone (PROVERA) 10 MG tablet  (Patient not taking: Reported on 03/03/2022)   [DISCONTINUED] polyethylene glycol-electrolytes (NULYTELY) 420 g solution See admin instructions. (Patient not taking: Reported on 03/03/2022)   No facility-administered encounter medications on file as of 03/03/2022.    Follow-up: Return in about 4 weeks (around 03/31/2022) for wt.   Wellington Hampshire, MD

## 2022-03-03 NOTE — Patient Instructions (Signed)
Welcome to [facility]! It was a pleasure meeting you today.  As discussed, Please schedule a 1 month follow up visit today.  Tumeric and ginger capsules.  Celebrex daily and tylenol max 1000mg  3x/day.   PLEASE NOTE:  If you had any LAB tests please let know if you have not heard back within a few days. You may see your results on MyChart before we have a chance to review them but we will give you a call once they are reviewed by [place]. If we ordered any REFERRALS today, please let us know if you have not heard from their office within the next week.  Let us know through MyChart if you are needing REFILLS, or have your pharmacy send [place] the request. You can also use MyChart to communicate with me or any office staff.  Please try these tips to maintain a healthy lifestyle:  Eat most of your calories during the day when you are active. Eliminate processed foods including packaged sweets (pies, cakes, cookies), reduce intake of potatoes, white bread, white pasta, and white rice. Look for whole grain options, oat flour or almond flour.  Each meal should contain half fruits/vegetables, one quarter protein, and one quarter carbs (no bigger than a computer mouse).  Cut down on sweet beverages. This includes juice, soda, and sweet tea. Also watch fruit intake, though this is a healthier sweet option, it still contains natural sugar! Limit to 3 servings daily.  Drink at least 1 glass of water with each meal and aim for at least 8 glasses per day  Exercise at least 150 minutes every week.

## 2022-03-04 ENCOUNTER — Other Ambulatory Visit (HOSPITAL_COMMUNITY): Payer: Self-pay

## 2022-03-04 MED ORDER — LATANOPROST 0.005 % OP SOLN
OPHTHALMIC | 0 refills | Status: DC
  Filled 2022-03-04: qty 7.5, 75d supply, fill #0

## 2022-03-07 ENCOUNTER — Other Ambulatory Visit (HOSPITAL_COMMUNITY): Payer: Self-pay

## 2022-03-11 ENCOUNTER — Other Ambulatory Visit (HOSPITAL_COMMUNITY): Payer: Self-pay

## 2022-03-11 IMAGING — CR DG CHEST 2V
2 series · 2 of 2 positions shown · non-contrast
Comparison: June 08, 2015.

CLINICAL DATA: Preop for hip surgery.

EXAM:
CHEST - 2 VIEW

[w chest pa]
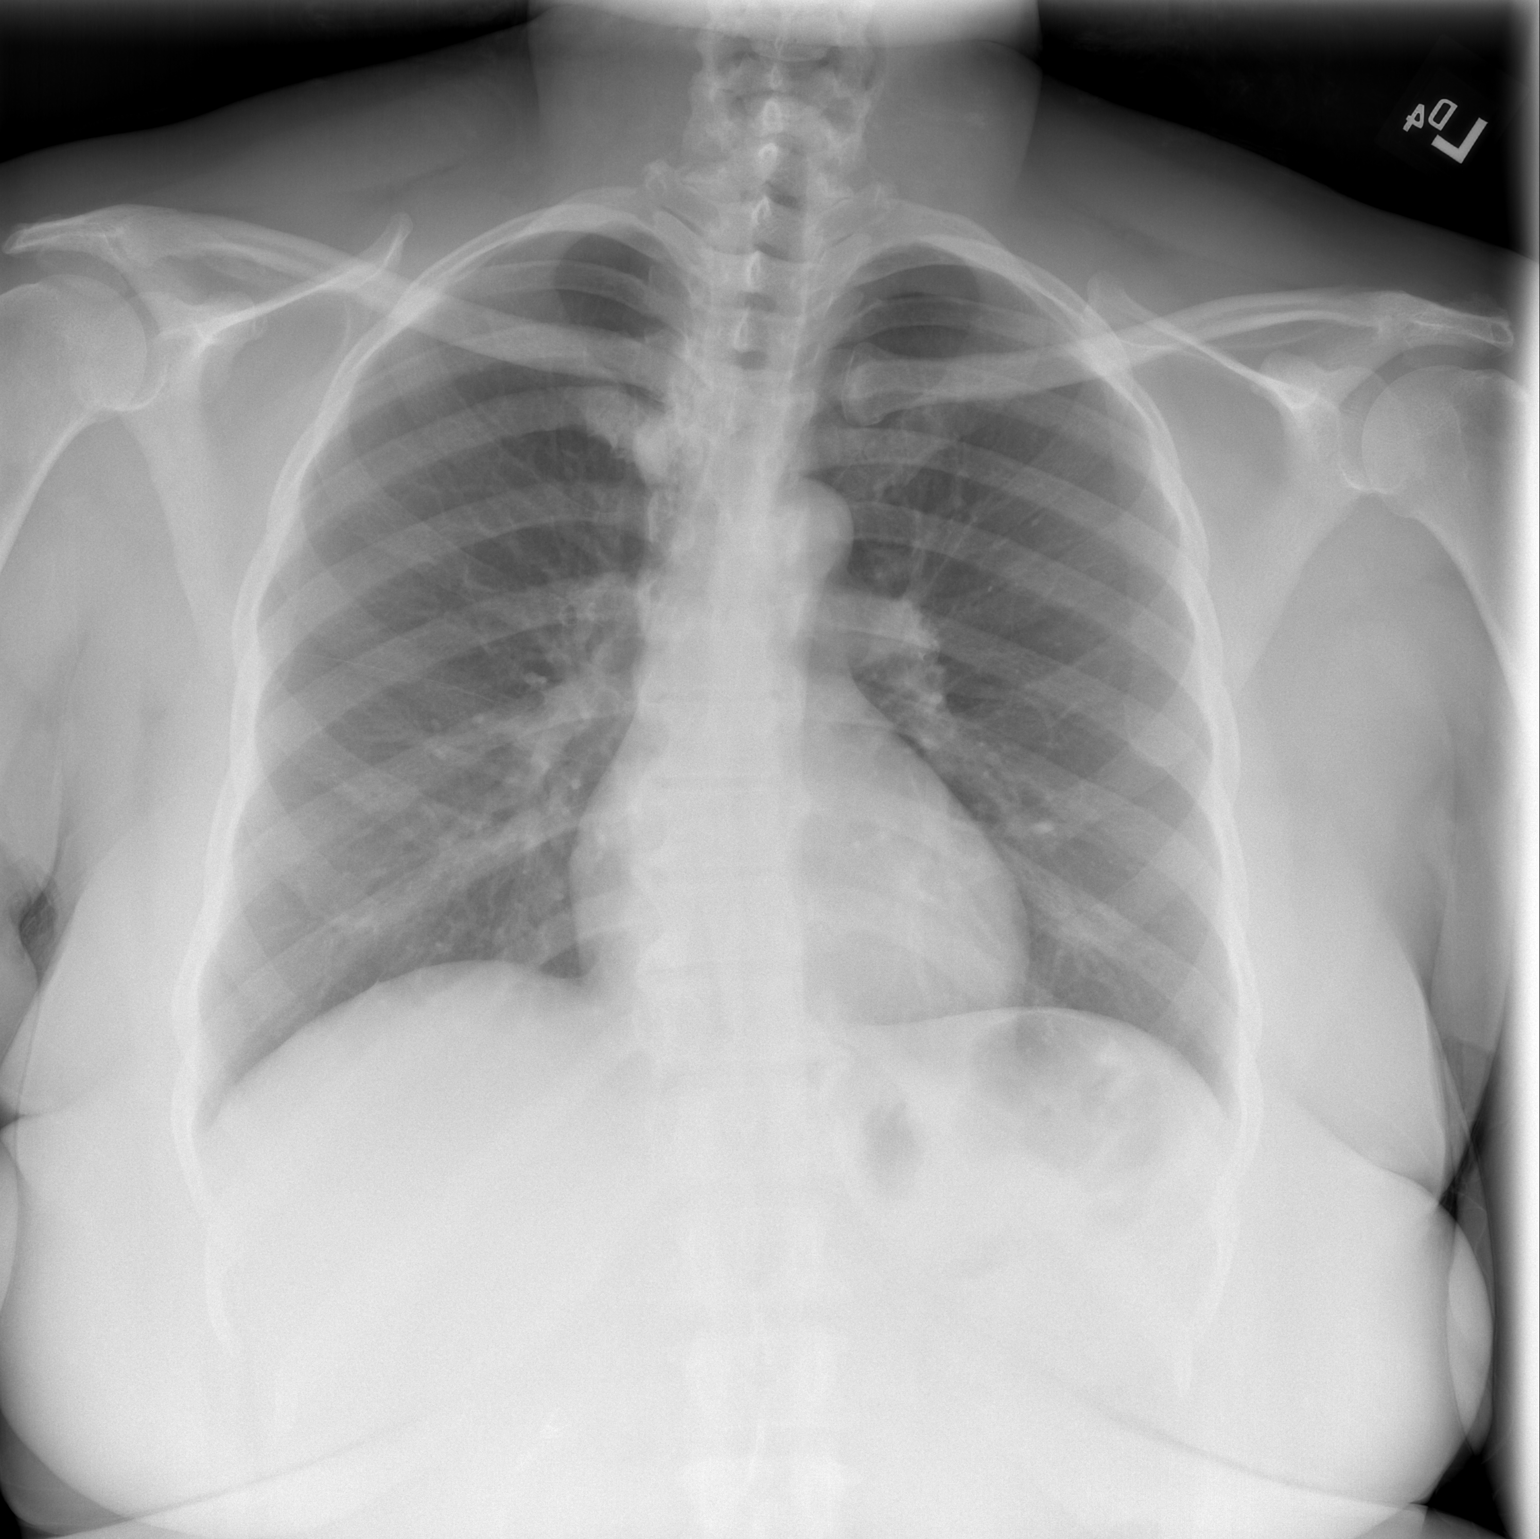

[w chest lat]
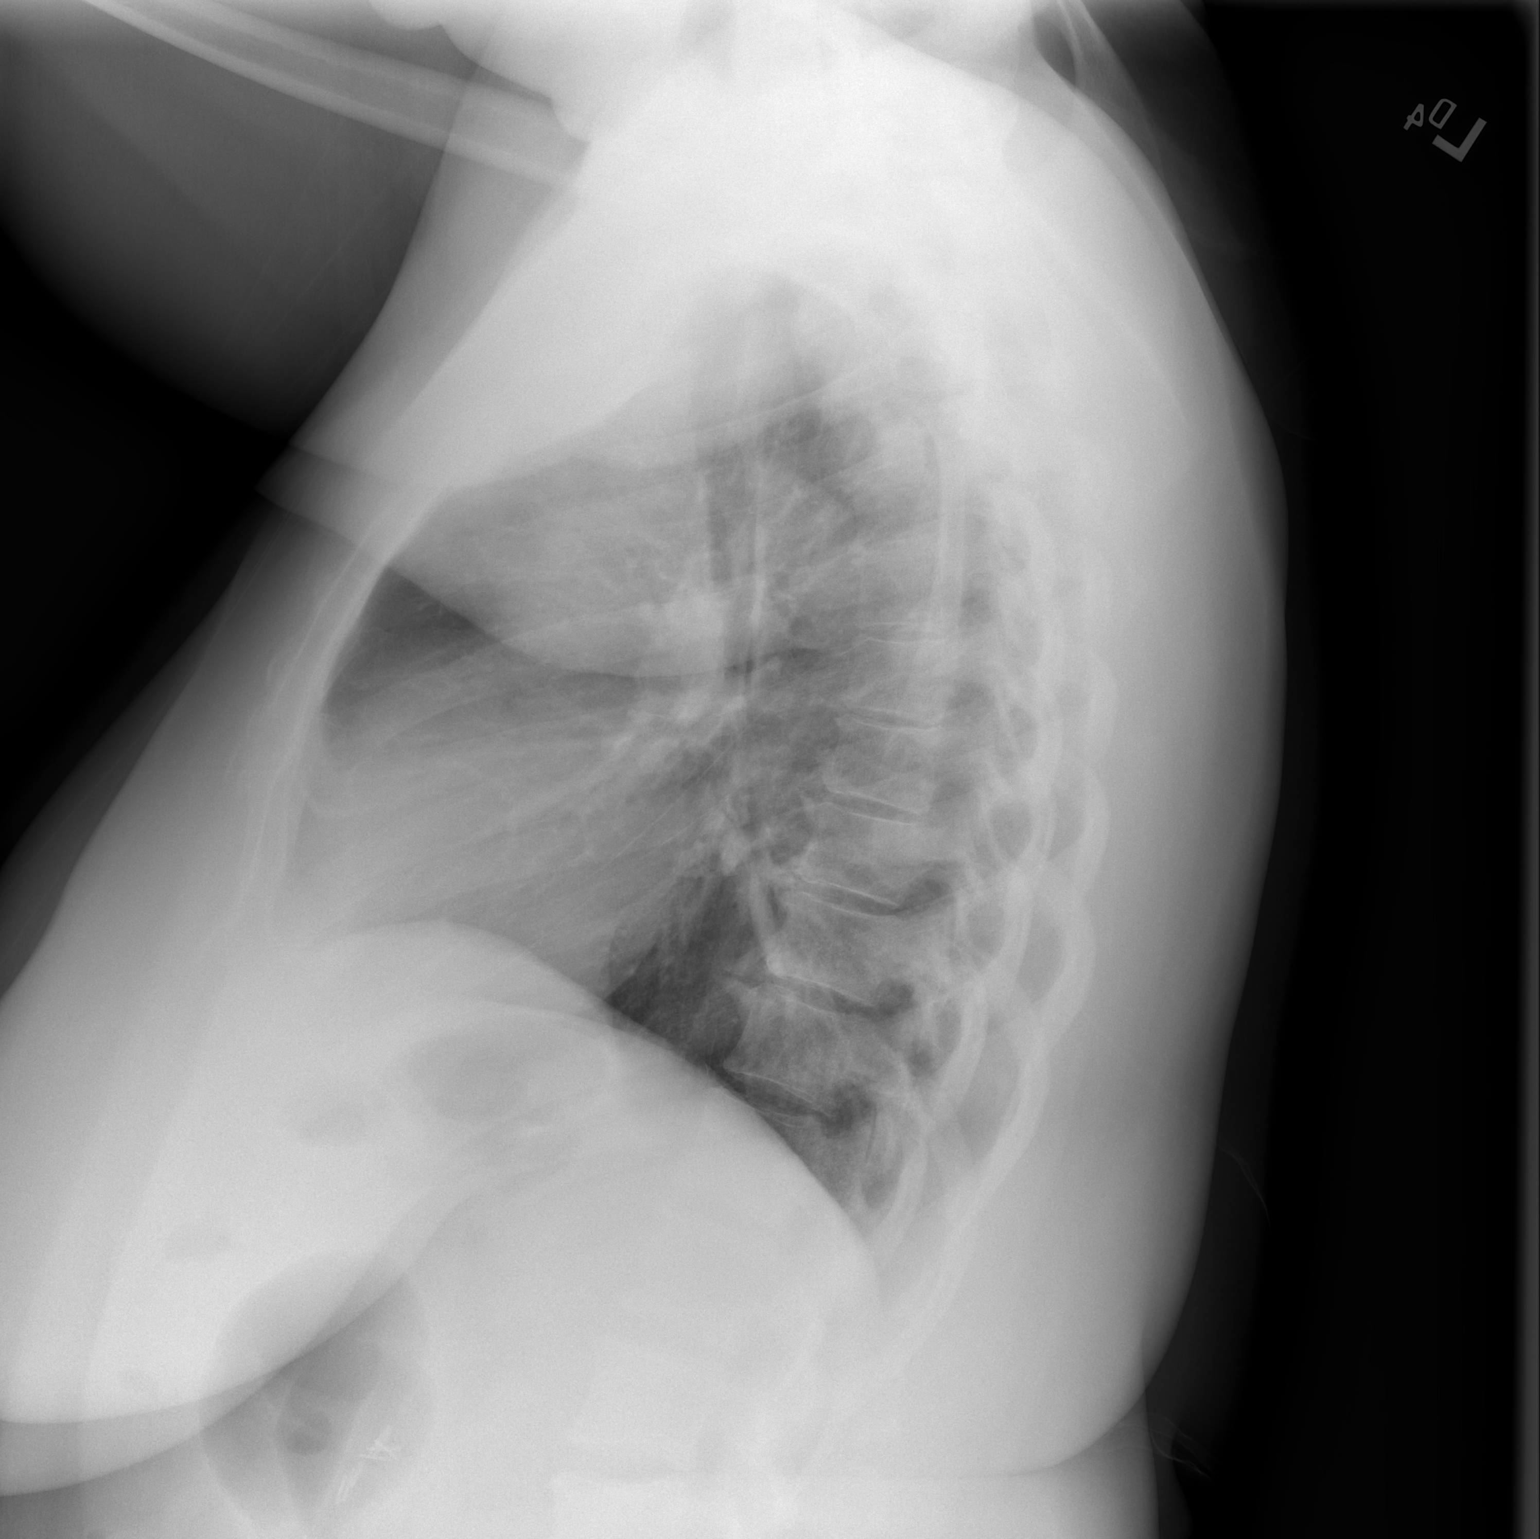

[2 of 2 positions shown; findings below may reference images not displayed]

FINDINGS: The heart size and mediastinal contours are within normal limits.
Both lungs are clear. The visualized skeletal structures are
unremarkable.
IMPRESSION: No active cardiopulmonary disease.

## 2022-03-18 ENCOUNTER — Other Ambulatory Visit (HOSPITAL_COMMUNITY): Payer: Self-pay

## 2022-03-19 ENCOUNTER — Other Ambulatory Visit (HOSPITAL_COMMUNITY): Payer: Self-pay

## 2022-03-19 ENCOUNTER — Other Ambulatory Visit: Payer: Self-pay | Admitting: Family Medicine

## 2022-03-19 MED ORDER — SAXENDA 18 MG/3ML ~~LOC~~ SOPN
3.0000 mg | PEN_INJECTOR | Freq: Every day | SUBCUTANEOUS | 1 refills | Status: DC
  Filled 2022-03-19 – 2022-04-01 (×2): qty 15, 30d supply, fill #0
  Filled 2022-05-04: qty 15, 30d supply, fill #1

## 2022-03-24 ENCOUNTER — Ambulatory Visit: Payer: BC Managed Care – PPO | Attending: Family Medicine | Admitting: Physical Therapy

## 2022-03-28 ENCOUNTER — Other Ambulatory Visit (HOSPITAL_COMMUNITY): Payer: Self-pay

## 2022-03-31 ENCOUNTER — Other Ambulatory Visit (HOSPITAL_COMMUNITY): Payer: Self-pay

## 2022-03-31 ENCOUNTER — Encounter: Payer: Self-pay | Admitting: Family Medicine

## 2022-03-31 ENCOUNTER — Ambulatory Visit (INDEPENDENT_AMBULATORY_CARE_PROVIDER_SITE_OTHER): Payer: BC Managed Care – PPO | Admitting: Family Medicine

## 2022-03-31 DIAGNOSIS — Z6841 Body Mass Index (BMI) 40.0 and over, adult: Secondary | ICD-10-CM | POA: Diagnosis not present

## 2022-03-31 MED ORDER — PHENTERMINE HCL 37.5 MG PO CAPS
37.5000 mg | ORAL_CAPSULE | Freq: Every day | ORAL | 0 refills | Status: DC
  Filled 2022-03-31 – 2022-05-04 (×2): qty 30, 30d supply, fill #0

## 2022-03-31 NOTE — Progress Notes (Signed)
Subjective:     Patient ID: Sandra Bird, female    DOB: [birth year], 51 y.o.   MRN: [id number]  Chief Complaint  Patient presents with   Follow-up    4 week follow-up for weight Fasting     HPI Weight-still hasn't gotten saxenda-needing PA.  Taking phentermine.  Not working on food choices.  Not drinking enough.  Not eating enough and late.   Elevated blood pressure-not sleeping well-knee and hip pain.  Meds not holding.  Missed PT appt.  No cp, sob.   Health Maintenance Due  Topic Date Due   DTaP/Tdap/Td (1 - Tdap) Never done   PAP SMEAR-Modifier  04/04/2017   Zoster Vaccines- Shingrix (1 of 2) Never done    Past Medical History:  Diagnosis Date   Arthritis    SVD (spontaneous vaginal delivery)    x 3    Past Surgical History:  Procedure Laterality Date   ABDOMINOPLASTY/PANNICULECTOMY     CHOLECYSTECTOMY     DILITATION & CURRETTAGE/HYSTROSCOPY WITH NOVASURE ABLATION N/A 07/25/2013   Procedure: DILATATION & CURETTAGE/HYSTEROSCOPY WITH NOVASURE ABLATION;  Surgeon: Gus Height, MD;  Location: [facility];  Service: Gynecology;  Laterality: N/A;   TONSILLECTOMY     TOTAL HIP ARTHROPLASTY Right 03/24/2020   Procedure: RIGHT TOTAL HIP ARTHROPLASTY ANTERIOR APPROACH;  Surgeon: Melrose Nakayama, MD;  Location: [facility];  Service: Orthopedics;  Laterality: Right;   TUBAL LIGATION     WISDOM TOOTH EXTRACTION      Outpatient Medications Prior to Visit  Medication Sig Dispense Refill   Boric Acid Vaginal (AZO BORIC ACID) 600 MG SUPP Compounded Boric Acid 600mg  vaginal capsules  Boric Acid 600mg  vaginal capsules.  DO NOT TAKE ORALLY/TOXIC  insert 1 suppository weekly x 6 weeks; then use prn     celecoxib (CELEBREX) 200 MG capsule Take 1 capsule (200 mg total) by mouth daily with food as needed for pain. 90 capsule 1   clobetasol cream (TEMOVATE) 0.05 % Apply to affected area two times daily 15 g 1   clotrimazole-betamethasone (LOTRISONE) cream APPLY A THIN COAT DAILY AS NEEDED  45 g 2   Crisaborole (EUCRISA) 2 % OINT Use 1 (one) Application two times daily 60 g 1   latanoprost (XALATAN) 0.005 % ophthalmic solution Place 1 drop into both eyes every evening 7.5 mL 0   linaclotide (LINZESS) 145 MCG CAPS capsule Take 1 capsule (145 mcg total) by mouth daily. Before the first meal of the day on and empty stomach. 30 capsule 6   Liraglutide -Weight Management (SAXENDA) 18 MG/3ML SOPN Inject 0.6 mg into the skin daily for 7 days, THEN 1.2 mg daily for 7 days, THEN 1.8 mg daily for 7 days, THEN 2.4 mg daily for 7 days, THEN 3 mg daily for 7 days. 12 mL 0   pantoprazole (PROTONIX) 40 MG tablet Take 1 tablet (40 mg total) by mouth daily. 30 tablet 5   phentermine 37.5 MG capsule Take 1 capsule (37.5 mg total) by mouth daily. 30 capsule 0   Liraglutide -Weight Management (SAXENDA) 18 MG/3ML SOPN Inject 3 mg into the skin daily. (Patient not taking: Reported on 03/31/2022) 15 mL 1   latanoprost (XALATAN) 0.005 % ophthalmic solution Place 1 drop into both eyes nightly.     No facility-administered medications prior to visit.    Allergies  Allergen Reactions   Penicillins Rash and Other (See Comments)    Has patient had a PCN reaction causing immediate rash, facial/tongue/throat swelling, SOB or  lightheadedness with hypotension: Yes Has patient had a PCN reaction causing severe rash involving mucus membranes or skin necrosis: No Has patient had a PCN reaction that required hospitalization No Has patient had a PCN reaction occurring within the last 10 years: No If all of the above answers are "NO", then may proceed with Cephalosporin use.    ROS neg/noncontributory except as noted HPI/below      Objective:     BP 120/86 (BP Location: Left Arm, Patient Position: Sitting, Cuff Size: Large)   Pulse 98   Temp 98.7 F (37.1 C) (Temporal)   Ht 5\' 2"  (1.575 m)   Wt 241 lb 8 oz (109.5 kg)   SpO2 99%   BMI 44.17 kg/m  Wt Readings from Last 3 Encounters:  03/31/22 241 lb 8 oz  (109.5 kg)  03/03/22 241 lb 8 oz (109.5 kg)  02/02/22 242 lb 9.6 oz (110 kg)    Physical Exam   Gen: WDWN NAD HEENT: NCAT, conjunctiva not injected, sclera nonicteric CARDIAC: RRR, S1S2+, no murmur.  EXT:  no edema MSK: no gross abnormalities.  NEURO: A&O x3.  CN II-XII intact.  PSYCH: normal mood. Good eye contact     Assessment & Plan:   Problem List Items Addressed This Visit       Other   Morbid (severe) obesity due to excess calories (HCC) - Primary   Relevant Medications   phentermine 37.5 MG capsule   Other Visit Diagnoses     Morbid obesity with BMI of 40.0-44.9, adult (HCC)       Relevant Medications   phentermine 37.5 MG capsule      Obesity-awaiting PA on Saxenda still.  Cont phentermine 37.5mg .  discussed proper diet, drinking more water and exercise.   NO excuses!.  Encouraged pt as well.  F/u 1 mo  Meds ordered this encounter  Medications   phentermine 37.5 MG capsule    Sig: Take 1 capsule (37.5 mg total) by mouth daily.    Dispense:  30 capsule    Refill:  0    04-09-1992, MD

## 2022-03-31 NOTE — Patient Instructions (Signed)
It was very nice to see you today!  Merry Christmas! Schedule physical therapy   PLEASE NOTE:  If you had any lab tests please let us know if you have not heard back within a few days. You may see your results on MyChart before we have a chance to review them but we will give you a call once they are reviewed by [place]. If we ordered any referrals today, please let us know if you have not heard from their office within the next week.   Please try these tips to maintain a healthy lifestyle:  Eat most of your calories during the day when you are active. Eliminate processed foods including packaged sweets (pies, cakes, cookies), reduce intake of potatoes, white bread, white pasta, and white rice. Look for whole grain options, oat flour or almond flour.  Each meal should contain half fruits/vegetables, one quarter protein, and one quarter carbs (no bigger than a computer mouse).  Cut down on sweet beverages. This includes juice, soda, and sweet tea. Also watch fruit intake, though this is a healthier sweet option, it still contains natural sugar! Limit to 3 servings daily.  Drink at least 1 glass of water with each meal and aim for at least 8 glasses per day  Exercise at least 150 minutes every week.

## 2022-04-01 ENCOUNTER — Other Ambulatory Visit (HOSPITAL_COMMUNITY): Payer: Self-pay

## 2022-04-04 ENCOUNTER — Other Ambulatory Visit (HOSPITAL_COMMUNITY): Payer: Self-pay

## 2022-04-06 ENCOUNTER — Telehealth: Payer: Self-pay | Admitting: *Deleted

## 2022-04-06 NOTE — Telephone Encounter (Signed)
No longer a pt of practice

## 2022-04-06 NOTE — Telephone Encounter (Signed)
Patient was called regarding medication. Patient informed me that she no longer see provider.

## 2022-04-28 ENCOUNTER — Other Ambulatory Visit (HOSPITAL_COMMUNITY): Payer: Self-pay

## 2022-04-28 ENCOUNTER — Ambulatory Visit: Payer: BC Managed Care – PPO | Admitting: Family Medicine

## 2022-04-28 ENCOUNTER — Encounter: Payer: Self-pay | Admitting: Family Medicine

## 2022-04-28 VITALS — BP 120/70 | HR 118 | Temp 98.7°F | Ht 62.0 in | Wt 243.2 lb

## 2022-04-28 DIAGNOSIS — R0981 Nasal congestion: Secondary | ICD-10-CM

## 2022-04-28 DIAGNOSIS — Z6841 Body Mass Index (BMI) 40.0 and over, adult: Secondary | ICD-10-CM | POA: Diagnosis not present

## 2022-04-28 DIAGNOSIS — R519 Headache, unspecified: Secondary | ICD-10-CM

## 2022-04-28 LAB — POC COVID19 BINAXNOW: SARS Coronavirus 2 Ag: NEGATIVE

## 2022-04-28 MED ORDER — DOXYCYCLINE HYCLATE 100 MG PO TABS
100.0000 mg | ORAL_TABLET | Freq: Two times a day (BID) | ORAL | 0 refills | Status: AC
  Filled 2022-04-28: qty 20, 10d supply, fill #0

## 2022-04-28 NOTE — Patient Instructions (Addendum)
It was very nice to see you today!  Hope you feel better.  Tylenol, ibuprofen, cold meds   PLEASE NOTE:  If you had any lab tests please let us know if you have not heard back within a few days. You may see your results on MyChart before we have a chance to review them but we will give you a call once they are reviewed by [place]. If we ordered any referrals today, please let us know if you have not heard from their office within the next week.   Please try these tips to maintain a healthy lifestyle:  Eat most of your calories during the day when you are active. Eliminate processed foods including packaged sweets (pies, cakes, cookies), reduce intake of potatoes, white bread, white pasta, and white rice. Look for whole grain options, oat flour or almond flour.  Each meal should contain half fruits/vegetables, one quarter protein, and one quarter carbs (no bigger than a computer mouse).  Cut down on sweet beverages. This includes juice, soda, and sweet tea. Also watch fruit intake, though this is a healthier sweet option, it still contains natural sugar! Limit to 3 servings daily.  Drink at least 1 glass of water with each meal and aim for at least 8 glasses per day  Exercise at least 150 minutes every week.

## 2022-04-28 NOTE — Progress Notes (Signed)
Subjective:     Patient ID: Sandra Bird, female    DOB: [birth year], 52 y.o.   MRN: [id number]  Chief Complaint  Patient presents with   Follow-up    4 week follow-up on weight    Headache   Nasal Congestion   Sinus Problem    Sx started this week    HPI  Obesity-never got Phentermine 37.5mg -has gained 2 pounds.  Has Saxenda on hand.  Hasn't started as has been sick.  Nasal congestion, HA, sinus pain.  Last wk, GI symptoms.  Then the congestion, etc since 1/1.  Yesterday starting coughing/bad frontal ha.  No f/c, no myalgias, occ sob.  Didn't check covid. No appetite/taste Hip-disabled.  Needs knees done.    Health Maintenance Due  Topic Date Due   DTaP/Tdap/Td (1 - Tdap) Never done   COLONOSCOPY (Pts 45-3yrs Insurance coverage will need to be confirmed)  Never done   PAP SMEAR-Modifier  04/04/2017   Zoster Vaccines- Shingrix (1 of 2) Never done    Past Medical History:  Diagnosis Date   Arthritis    SVD (spontaneous vaginal delivery)    x 3    Past Surgical History:  Procedure Laterality Date   ABDOMINOPLASTY/PANNICULECTOMY     CHOLECYSTECTOMY     DILITATION & CURRETTAGE/HYSTROSCOPY WITH NOVASURE ABLATION N/A 07/25/2013   Procedure: DILATATION & CURETTAGE/HYSTEROSCOPY WITH NOVASURE ABLATION;  Surgeon: Gus Height, MD;  Location: [facility];  Service: Gynecology;  Laterality: N/A;   TONSILLECTOMY     TOTAL HIP ARTHROPLASTY Right 03/24/2020   Procedure: RIGHT TOTAL HIP ARTHROPLASTY ANTERIOR APPROACH;  Surgeon: Melrose Nakayama, MD;  Location: [facility];  Service: Orthopedics;  Laterality: Right;   TUBAL LIGATION     WISDOM TOOTH EXTRACTION      Outpatient Medications Prior to Visit  Medication Sig Dispense Refill   Boric Acid Vaginal (AZO BORIC ACID) 600 MG SUPP Compounded Boric Acid 600mg  vaginal capsules  Boric Acid 600mg  vaginal capsules.  DO NOT TAKE ORALLY/TOXIC  insert 1 suppository weekly x 6 weeks; then use prn     celecoxib (CELEBREX) 200 MG capsule Take 1  capsule (200 mg total) by mouth daily with food as needed for pain. 90 capsule 1   clobetasol cream (TEMOVATE) 0.05 % Apply to affected area two times daily 15 g 1   clotrimazole-betamethasone (LOTRISONE) cream APPLY A THIN COAT DAILY AS NEEDED 45 g 2   Crisaborole (EUCRISA) 2 % OINT Use 1 (one) Application two times daily 60 g 1   latanoprost (XALATAN) 0.005 % ophthalmic solution Place 1 drop into both eyes every evening 7.5 mL 0   linaclotide (LINZESS) 145 MCG CAPS capsule Take 1 capsule (145 mcg total) by mouth daily. Before the first meal of the day on and empty stomach. 30 capsule 6   Liraglutide -Weight Management (SAXENDA) 18 MG/3ML SOPN Inject 3 mg into the skin daily. 15 mL 1   pantoprazole (PROTONIX) 40 MG tablet Take 1 tablet (40 mg total) by mouth daily. 30 tablet 5   phentermine 37.5 MG capsule Take 1 capsule (37.5 mg total) by mouth daily. 30 capsule 0   No facility-administered medications prior to visit.    Allergies  Allergen Reactions   Penicillins Rash and Other (See Comments)    Has patient had a PCN reaction causing immediate rash, facial/tongue/throat swelling, SOB or lightheadedness with hypotension: Yes Has patient had a PCN reaction causing severe rash involving mucus membranes or skin necrosis: No Has patient had a  PCN reaction that required hospitalization No Has patient had a PCN reaction occurring within the last 10 years: No If all of the above answers are "NO", then may proceed with Cephalosporin use.    ROS neg/noncontributory except as noted HPI/below      Objective:     BP 120/70   Pulse (!) 118   Temp 98.7 F (37.1 C) (Temporal)   Ht 5\' 2"  (1.575 m)   Wt 243 lb 4 oz (110.3 kg)   SpO2 99%   BMI 44.49 kg/m  Wt Readings from Last 3 Encounters:  04/28/22 243 lb 4 oz (110.3 kg)  03/31/22 241 lb 8 oz (109.5 kg)  03/03/22 241 lb 8 oz (109.5 kg)    Physical Exam   Gen: WDWN NAD HEENT: NCAT, conjunctiva not injected, sclera nonicteric TM WNL  B, OP moist, no exudates  very congested.  Frontal sinuses very tender to percussion. NECK:  supple, no thyromegaly, no nodes, no carotid bruits CARDIAC: tachy RRR, S1S2+, no murmur. LUNGS: CTAB. No wheezes EXT:  no edema MSK: no gross abnormalities.  NEURO: A&O x3.  CN II-XII intact.  PSYCH: normal mood. Good eye contact  Results for orders placed or performed in visit on 04/28/22  POC COVID-19  Result Value Ref Range   SARS Coronavirus 2 Ag Negative Negative        Assessment & Plan:   Problem List Items Addressed This Visit   None Visit Diagnoses     Nonintractable headache, unspecified chronicity pattern, unspecified headache type    -  Primary   Relevant Orders   POC COVID-19   Sinus congestion       Relevant Orders   POC COVID-19   Morbid obesity with BMI of 40.0-44.9, adult (HCC)          Sinus congestion-symptomatic tx. Doxy 100 bid x 10d Morbid obesity-has been sick so not started [place] yet nor phentermine-she will start when feeling better.  F/u 6 wks.   No orders of the defined types were placed in this encounter.   Wellington Hampshire, MD

## 2022-05-04 ENCOUNTER — Other Ambulatory Visit: Payer: Self-pay

## 2022-05-04 ENCOUNTER — Other Ambulatory Visit (HOSPITAL_COMMUNITY): Payer: Self-pay

## 2022-05-04 ENCOUNTER — Other Ambulatory Visit: Payer: Self-pay | Admitting: Family Medicine

## 2022-05-05 ENCOUNTER — Other Ambulatory Visit (HOSPITAL_COMMUNITY): Payer: Self-pay

## 2022-05-05 MED ORDER — LATANOPROST 0.005 % OP SOLN
OPHTHALMIC | 3 refills | Status: DC
  Filled 2022-05-05: qty 7.5, 75d supply, fill #0
  Filled 2022-08-08: qty 7.5, 75d supply, fill #1
  Filled 2022-10-13: qty 7.5, 75d supply, fill #2
  Filled 2022-12-23: qty 7.5, 75d supply, fill #3

## 2022-05-09 ENCOUNTER — Other Ambulatory Visit (HOSPITAL_COMMUNITY): Payer: Self-pay

## 2022-05-30 NOTE — Therapy (Incomplete)
OUTPATIENT PHYSICAL THERAPY LOWER EXTREMITY EVALUATION   Patient Name: Sandra Bird MRN: [id number] DOB:Aug 07, 1970, 52 y.o., female Today's Date: 05/30/2022  END OF SESSION:   Past Medical History:  Diagnosis Date   Arthritis    SVD (spontaneous vaginal delivery)    x 3   Past Surgical History:  Procedure Laterality Date   ABDOMINOPLASTY/PANNICULECTOMY     CHOLECYSTECTOMY     DILITATION & CURRETTAGE/HYSTROSCOPY WITH NOVASURE ABLATION N/A 07/25/2013   Procedure: DILATATION & CURETTAGE/HYSTEROSCOPY WITH NOVASURE ABLATION;  Surgeon: Gus Height, MD;  Location: [facility];  Service: Gynecology;  Laterality: N/A;   TONSILLECTOMY     TOTAL HIP ARTHROPLASTY Right 03/24/2020   Procedure: RIGHT TOTAL HIP ARTHROPLASTY ANTERIOR APPROACH;  Surgeon: Melrose Nakayama, MD;  Location: [facility];  Service: Orthopedics;  Laterality: Right;   TUBAL LIGATION     WISDOM TOOTH EXTRACTION     Patient Active Problem List   Diagnosis Date Noted   Abnormal Pap smear of cervix 12/14/2021   Hypercholesteremia 11/19/2021   OA (osteoarthritis) 11/19/2021   Microscopic hematuria 09/08/2021   Hyperlipidemia 12/09/2020   Menometrorrhagia 12/09/2020   Tinea cruris 12/09/2020   S/P endometrial ablation 07/21/2020   Suspected sleep apnea 07/16/2020   Primary localized osteoarthritis of right hip 03/24/2020   Primary osteoarthritis of right hip 03/24/2020   Osteoarthritis of right hip 03/24/2020   Hypercholesterolemia 07/12/2018   IFG (impaired fasting glucose) 08/12/2012   Morbid (severe) obesity due to excess calories ([place]) 06/12/2012   OBSTRUCTIVE SLEEP APNEA 05/27/2009    PCP: Tawnya Crook, MD  REFERRING PROVIDER: Tawnya Crook, MD  REFERRING DIAG: Primary osteoarthritis of right hip  THERAPY DIAG:  No diagnosis found.  Rationale for Evaluation and Treatment: Rehabilitation  ONSET DATE: ***   SUBJECTIVE:  SUBJECTIVE STATEMENT: ***  PERTINENT HISTORY: ***  PAIN:  Are you having  pain? Yes:  NPRS scale: ***/10 Pain location: *** Pain description: *** Aggravating factors: *** Relieving factors: ***  PRECAUTIONS: None  WEIGHT BEARING RESTRICTIONS: No  FALLS:  Has patient fallen in last 6 months? No  OCCUPATION: Disability  PLOF: Independent  PATIENT GOALS: ***   OBJECTIVE:  PATIENT SURVEYS:  FOTO ***  COGNITION: Overall cognitive status: Within functional limits for tasks assessed     SENSATION: WFL  MUSCLE LENGTH: ***  POSTURE:   ***  PALPATION: ***  LOWER EXTREMITY ROM:  {AROM/PROM:27142} ROM Right eval Left eval  Hip flexion    Hip extension    Hip abduction    Hip adduction    Hip internal rotation    Hip external rotation    Knee flexion    Knee extension    Ankle dorsiflexion    Ankle plantarflexion    Ankle inversion    Ankle eversion     (Blank rows = not tested)  LOWER EXTREMITY MMT:  MMT Right eval Left eval  Hip flexion    Hip extension    Hip abduction    Hip adduction    Hip internal rotation    Hip external rotation    Knee flexion    Knee extension    Ankle dorsiflexion    Ankle plantarflexion    Ankle inversion    Ankle eversion     (Blank rows = not tested)  LOWER EXTREMITY SPECIAL TESTS:  {LEspecialtests:26242}  FUNCTIONAL TESTS:  {Functional tests:24029}  GAIT: Distance walked: *** Assistive device utilized: {Assistive devices:23999} Level of assistance: {Levels of assistance:24026} Comments: ***   TODAY'S TREATMENT:  [facility] Adult PT Treatment:                                                DATE: 05/31/2022 Therapeutic Exercise: ***  PATIENT EDUCATION:  Education details: Exam findings, POC, HEP Person educated: Patient Education method: Explanation, Demonstration, Tactile cues, Verbal cues, and Handouts Education comprehension: verbalized understanding, returned demonstration, verbal cues required, tactile cues required, and needs further education  HOME EXERCISE  PROGRAM: ***   ASSESSMENT: CLINICAL IMPRESSION: Patient is a 52 y.o. female who was seen today for physical therapy evaluation and treatment for ***.   OBJECTIVE IMPAIRMENTS: {opptimpairments:25111}.   ACTIVITY LIMITATIONS: {activitylimitations:27494}  PARTICIPATION LIMITATIONS: {participationrestrictions:25113}  PERSONAL FACTORS: {Personal factors:25162} are also affecting patient's functional outcome.   REHAB POTENTIAL: {rehabpotential:25112}  CLINICAL DECISION MAKING: {clinical decision making:25114}  EVALUATION COMPLEXITY: {Evaluation complexity:25115}   GOALS: Goals reviewed with patient? {yes/no:20286}  SHORT TERM GOALS: Target date: ***  Patient will be I with initial HEP in order to progress with therapy. Baseline: HEP provided at eval Goal status: INITIAL  2.  PT will review FOTO with patient by 3rd visit in order to understand expected progress and outcome with therapy. Baseline: FOTO assessed at eval Goal status: INITIAL  3.  *** Baseline:  Goal status: INITIAL  LONG TERM GOALS: Target date: ***  Patient will be I with final HEP to maintain progress from PT. Baseline: HEP provided at eval Goal status: INITIAL  2.  Patient will report >/= ***% status on FOTO to indicate improved functional ability. Baseline:  Goal status: INITIAL  3.  *** Baseline:  Goal status: INITIAL  4.  *** Baseline:  Goal status: INITIAL   PLAN: PT FREQUENCY: {rehab frequency:25116}  PT DURATION: {rehab duration:25117}  PLANNED INTERVENTIONS: {rehab planned interventions:25118::"Therapeutic exercises","Therapeutic activity","Neuromuscular re-education","Balance training","Gait training","Patient/Family education","Self Care","Joint mobilization"}  PLAN FOR NEXT SESSION: Review HEP and progress PRN, ***   Hilda Blades, PT, DPT, LAT, ATC 05/30/22  9:16 AM Phone: [phone] Fax: [phone]

## 2022-05-31 ENCOUNTER — Ambulatory Visit: Payer: BC Managed Care – PPO | Admitting: Physical Therapy

## 2022-06-01 ENCOUNTER — Ambulatory Visit (INDEPENDENT_AMBULATORY_CARE_PROVIDER_SITE_OTHER): Payer: BC Managed Care – PPO | Admitting: Family Medicine

## 2022-06-01 ENCOUNTER — Other Ambulatory Visit (HOSPITAL_COMMUNITY): Payer: Self-pay

## 2022-06-01 ENCOUNTER — Encounter: Payer: Self-pay | Admitting: Family Medicine

## 2022-06-01 VITALS — BP 130/80 | HR 80 | Temp 98.7°F | Ht 62.0 in | Wt 239.2 lb

## 2022-06-01 DIAGNOSIS — M170 Bilateral primary osteoarthritis of knee: Secondary | ICD-10-CM

## 2022-06-01 DIAGNOSIS — M1611 Unilateral primary osteoarthritis, right hip: Secondary | ICD-10-CM

## 2022-06-01 DIAGNOSIS — R052 Subacute cough: Secondary | ICD-10-CM

## 2022-06-01 DIAGNOSIS — Z6841 Body Mass Index (BMI) 40.0 and over, adult: Secondary | ICD-10-CM

## 2022-06-01 MED ORDER — SAXENDA 18 MG/3ML ~~LOC~~ SOPN
3.0000 mg | PEN_INJECTOR | Freq: Every day | SUBCUTANEOUS | 0 refills | Status: DC
  Filled 2022-06-01: qty 15, 30d supply, fill #0
  Filled 2022-07-12 – 2022-07-18 (×2): qty 15, 30d supply, fill #1
  Filled 2022-09-27 – 2022-10-10 (×2): qty 15, 30d supply, fill #2

## 2022-06-01 MED ORDER — PREDNISONE 20 MG PO TABS
40.0000 mg | ORAL_TABLET | Freq: Every day | ORAL | 0 refills | Status: AC
  Filled 2022-06-01: qty 10, 5d supply, fill #0

## 2022-06-01 MED ORDER — ALBUTEROL SULFATE HFA 108 (90 BASE) MCG/ACT IN AERS
2.0000 | INHALATION_SPRAY | Freq: Four times a day (QID) | RESPIRATORY_TRACT | 0 refills | Status: AC | PRN
  Filled 2022-06-01: qty 6.7, 25d supply, fill #0

## 2022-06-01 MED ORDER — PHENTERMINE HCL 37.5 MG PO TABS
37.5000 mg | ORAL_TABLET | Freq: Every day | ORAL | 2 refills | Status: DC
  Filled 2022-06-01: qty 30, 30d supply, fill #0
  Filled 2022-07-12: qty 30, 30d supply, fill #1
  Filled 2022-08-24: qty 30, 30d supply, fill #2

## 2022-06-01 MED ORDER — TECHLITE PEN NEEDLES 32G X 4 MM MISC
1.0000 | Freq: Every day | 1 refills | Status: DC
  Filled 2022-06-01: qty 100, 100d supply, fill #0

## 2022-06-01 NOTE — Progress Notes (Signed)
Subjective:     Patient ID: Sandra Bird, female    DOB: 1970/12/06, 52 y.o.   MRN: [id number]  Chief Complaint  Patient presents with   Follow-up    6 week follow-up on weight Not fasting    Cough    Non productive cough that started 6 weeks ago, not going away    HPI  Obesity-on saxenda-on 3.  On phentermine.  Doing well.  Exercising some-hips/knees inhibit some-doing "walk away the pounds".   Drinking Premier shakes in am, lunch some fruit, supper-chicken/spinach/cabbage.  Not a lot of starch.  Drinking water Cough for 1 mo.  Had URI.  OTC not helping.  Was on doxy and better.  Dry hacking cough.  No SOB. Poss asthma in distant past.  No f/c.   B hip/knee pain-wants referral to [facility].    Health Maintenance Due  Topic Date Due   DTaP/Tdap/Td (1 - Tdap) Never done   COLONOSCOPY (Pts 45-2yrs Insurance coverage will need to be confirmed)  Never done   PAP SMEAR-Modifier  04/04/2017    Past Medical History:  Diagnosis Date   Arthritis    SVD (spontaneous vaginal delivery)    x 3    Past Surgical History:  Procedure Laterality Date   ABDOMINOPLASTY/PANNICULECTOMY     CHOLECYSTECTOMY     DILITATION & CURRETTAGE/HYSTROSCOPY WITH NOVASURE ABLATION N/A 07/25/2013   Procedure: DILATATION & CURETTAGE/HYSTEROSCOPY WITH NOVASURE ABLATION;  Surgeon: Gus Height, MD;  Location: [facility];  Service: Gynecology;  Laterality: N/A;   TONSILLECTOMY     TOTAL HIP ARTHROPLASTY Right 03/24/2020   Procedure: RIGHT TOTAL HIP ARTHROPLASTY ANTERIOR APPROACH;  Surgeon: Melrose Nakayama, MD;  Location: [facility];  Service: Orthopedics;  Laterality: Right;   TUBAL LIGATION     WISDOM TOOTH EXTRACTION      Outpatient Medications Prior to Visit  Medication Sig Dispense Refill   Boric Acid Vaginal (AZO BORIC ACID) 600 MG SUPP Compounded Boric Acid 600mg  vaginal capsules  Boric Acid 600mg  vaginal capsules.  DO NOT TAKE ORALLY/TOXIC  insert 1 suppository weekly x 6  weeks; then use prn     celecoxib (CELEBREX) 200 MG capsule Take 1 capsule (200 mg total) by mouth daily with food as needed for pain. 90 capsule 1   clobetasol cream (TEMOVATE) 0.05 % Apply to affected area two times daily 15 g 1   clotrimazole-betamethasone (LOTRISONE) cream APPLY A THIN COAT DAILY AS NEEDED 45 g 2   Crisaborole (EUCRISA) 2 % OINT Use 1 (one) Application two times daily 60 g 1   latanoprost (XALATAN) 0.005 % ophthalmic solution Place 1 drop into both eyes at bedtime 7.5 mL 3   linaclotide (LINZESS) 145 MCG CAPS capsule Take 1 capsule (145 mcg total) by mouth daily. Before the first meal of the day on and empty stomach. 30 capsule 6   pantoprazole (PROTONIX) 40 MG tablet Take 1 tablet (40 mg total) by mouth daily. 30 tablet 5   Liraglutide -Weight Management (SAXENDA) 18 MG/3ML SOPN Inject 3 mg into the skin daily. 15 mL 1   phentermine 37.5 MG capsule Take 1 capsule (37.5 mg total) by mouth daily. 30 capsule 0   No facility-administered medications prior to visit.    Allergies  Allergen Reactions   Penicillins Rash and Other (See Comments)    Has patient had a PCN reaction causing immediate rash, facial/tongue/throat swelling, SOB or lightheadedness with hypotension: Yes Has patient had a PCN reaction causing severe  rash involving mucus membranes or skin necrosis: No Has patient had a PCN reaction that required hospitalization No Has patient had a PCN reaction occurring within the last 10 years: No If all of the above answers are "NO", then may proceed with Cephalosporin use.    ROS neg/noncontributory except as noted HPI/below      Objective:     BP 130/80   Pulse 80   Temp 98.7 F (37.1 C) (Temporal)   Ht 5\' 2"  (1.575 m)   Wt 239 lb 4 oz (108.5 kg)   SpO2 99%   BMI 43.76 kg/m  Wt Readings from Last 3 Encounters:  06/01/22 239 lb 4 oz (108.5 kg)  04/28/22 243 lb 4 oz (110.3 kg)  03/31/22 241 lb 8 oz (109.5 kg)    Physical Exam   Gen: WDWN  NAD HEENT: NCAT, conjunctiva not injected, sclera nonicteric NECK:  supple, no thyromegaly, no nodes, no carotid bruits CARDIAC: RRR, S1S2+, no murmur.  LUNGS: CTAB. No wheezes MSK: no gross abnormalities.  NEURO: A&O x3.  CN II-XII intact.  PSYCH: normal mood. Good eye contact     Assessment & Plan:   Problem List Items Addressed This Visit       Musculoskeletal and Integument   Primary osteoarthritis of right hip   Relevant Medications   predniSONE (DELTASONE) 20 MG tablet   Other Relevant Orders   Ambulatory referral to Pain Clinic   OA (osteoarthritis)   Relevant Medications   predniSONE (DELTASONE) 20 MG tablet   Other Relevant Orders   Ambulatory referral to Pain Clinic   Ambulatory referral to Pain Clinic   Other Visit Diagnoses     Morbid obesity with BMI of 40.0-44.9, adult ([place])    -  Primary   Relevant Medications   Liraglutide -Weight Management (SAXENDA) 18 MG/3ML SOPN   phentermine (ADIPEX-P) 37.5 MG tablet   Subacute cough          Morbid obesity-chronic.  Pt working on diet/exercise.  Doing well on saxenda 3mg  daily and phentermine 37.5mg  daily-renewed PDMP checked.  Has lost 4 #.  F/u 3 mo Cough-post infectious.  Pred 40mg  daily.  Albuterol inhaler 2 puffs q 6 hr prn. Osteoarthritis hips/knees-inhibits activity-pt req referral pain mgmt [facility]   Meds ordered this encounter  Medications   Liraglutide -Weight Management (SAXENDA) 18 MG/3ML SOPN    Sig: Inject 3 mg into the skin daily.    Dispense:  45 mL    Refill:  0   phentermine (ADIPEX-P) 37.5 MG tablet    Sig: Take 1 tablet (37.5 mg total) by mouth daily before breakfast.    Dispense:  30 tablet    Refill:  2   predniSONE (DELTASONE) 20 MG tablet    Sig: Take 2 tablets (40 mg total) by mouth daily with breakfast for 5 days.    Dispense:  10 tablet    Refill:  0   albuterol (VENTOLIN HFA) 108 (90 Base) MCG/ACT inhaler    Sig: Inhale 2 puffs into the lungs every 6 (six) hours as  needed for wheezing or shortness of breath.    Dispense:  8 g    Refill:  0   Insulin Pen Needle (PEN NEEDLES 5/16") 30G X 8 MM MISC    Sig: 1 each by Does not apply route daily in the afternoon.    Dispense:  100 each    Refill:  1    Wellington Hampshire, MD

## 2022-06-01 NOTE — Patient Instructions (Signed)
Keep up the good work  Referring to [employer]

## 2022-06-03 ENCOUNTER — Ambulatory Visit (INDEPENDENT_AMBULATORY_CARE_PROVIDER_SITE_OTHER): Payer: Self-pay | Admitting: Plastic Surgery

## 2022-06-03 ENCOUNTER — Encounter: Payer: Self-pay | Admitting: Plastic Surgery

## 2022-06-03 VITALS — BP 149/88 | HR 99

## 2022-06-03 DIAGNOSIS — Z719 Counseling, unspecified: Secondary | ICD-10-CM

## 2022-06-03 NOTE — Addendum Note (Signed)
Addended by: Lindon Romp on: 06/03/2022 01:59 PM   Modules accepted: Orders

## 2022-06-03 NOTE — Progress Notes (Signed)
The patient is a lovely 52 year old female here for evaluation of her face.  We have talked before.  She is interested in possible filler.  For the area that she is most concerned about is in her marionette lines.  What she really wants is skin tightening which filler is not going to really do.  I am going to send her information for Kybella and Sciton laser.

## 2022-06-07 ENCOUNTER — Ambulatory Visit: Payer: BC Managed Care – PPO | Admitting: Family Medicine

## 2022-06-07 NOTE — Therapy (Incomplete)
OUTPATIENT PHYSICAL THERAPY LOWER EXTREMITY EVALUATION   Patient Name: Sandra Bird MRN: [id number] DOB:1971/02/05, 52 y.o., female Today's Date: 06/07/2022  END OF SESSION:   Past Medical History:  Diagnosis Date   Arthritis    SVD (spontaneous vaginal delivery)    x 3   Past Surgical History:  Procedure Laterality Date   ABDOMINOPLASTY/PANNICULECTOMY     CHOLECYSTECTOMY     DILITATION & CURRETTAGE/HYSTROSCOPY WITH NOVASURE ABLATION N/A 07/25/2013   Procedure: DILATATION & CURETTAGE/HYSTEROSCOPY WITH NOVASURE ABLATION;  Surgeon: Gus Height, MD;  Location: [facility];  Service: Gynecology;  Laterality: N/A;   TONSILLECTOMY     TOTAL HIP ARTHROPLASTY Right 03/24/2020   Procedure: RIGHT TOTAL HIP ARTHROPLASTY ANTERIOR APPROACH;  Surgeon: Melrose Nakayama, MD;  Location: [facility];  Service: Orthopedics;  Laterality: Right;   TUBAL LIGATION     WISDOM TOOTH EXTRACTION     Patient Active Problem List   Diagnosis Date Noted   Encounter for counseling 06/03/2022   Abnormal Pap smear of cervix 12/14/2021   Hypercholesteremia 11/19/2021   OA (osteoarthritis) 11/19/2021   Microscopic hematuria 09/08/2021   Hyperlipidemia 12/09/2020   Menometrorrhagia 12/09/2020   Tinea cruris 12/09/2020   S/P endometrial ablation 07/21/2020   Suspected sleep apnea 07/16/2020   Primary localized osteoarthritis of right hip 03/24/2020   Primary osteoarthritis of right hip 03/24/2020   Osteoarthritis of right hip 03/24/2020   Hypercholesterolemia 07/12/2018   IFG (impaired fasting glucose) 08/12/2012   Morbid (severe) obesity due to excess calories ([place]) 06/12/2012   OBSTRUCTIVE SLEEP APNEA 05/27/2009    PCP: Tawnya Crook, MD  REFERRING PROVIDER: Tawnya Crook, MD  REFERRING DIAG: M16.11 (ICD-10-CM) - Primary osteoarthritis of right hip   THERAPY DIAG:  No diagnosis found.  Rationale for Evaluation and Treatment: Rehabilitation  ONSET DATE: chronic   SUBJECTIVE:   SUBJECTIVE  STATEMENT: ***  PERTINENT HISTORY: Rt THA 2021 PAIN:  Are you having pain? {OPRCPAIN:27236}  PRECAUTIONS: {Therapy precautions:24002}  WEIGHT BEARING RESTRICTIONS: {Yes ***/No:24003}  FALLS:  Has patient fallen in last 6 months? {fallsyesno:27318}  LIVING ENVIRONMENT: Lives with: {[facility] lives with:25569::"lives with their family"} Lives in: {Lives in:25570} Stairs: {opstairs:27293} Has following equipment at home: {Assistive devices:23999}  OCCUPATION: ***  PLOF: {PLOF:24004}  PATIENT GOALS: ***  NEXT MD VISIT: ***  OBJECTIVE:   DIAGNOSTIC FINDINGS: ***  PATIENT SURVEYS:  FOTO ***  COGNITION: Overall cognitive status: {cognition:24006}     SENSATION: {sensation:27233}  EDEMA:  {edema:24020}  MUSCLE LENGTH: Hamstrings: Right *** deg; Left *** deg Thomas test: Right *** deg; Left *** deg  POSTURE: {posture:25561}  PALPATION: ***  LOWER EXTREMITY ROM:  Active ROM Right eval Left eval  Hip flexion    Hip extension    Hip abduction    Hip adduction    Hip internal rotation    Hip external rotation    Knee flexion    Knee extension    Ankle dorsiflexion    Ankle plantarflexion    Ankle inversion    Ankle eversion     (Blank rows = not tested)  LOWER EXTREMITY MMT:  MMT Right eval Left eval  Hip flexion    Hip extension    Hip abduction    Hip adduction    Hip internal rotation    Hip external rotation    Knee flexion    Knee extension    Ankle dorsiflexion    Ankle plantarflexion    Ankle inversion    Ankle eversion     (  Blank rows = not tested)  LOWER EXTREMITY SPECIAL TESTS:    FUNCTIONAL TESTS:  5 x STS 2 MWT  GAIT: Distance walked: *** Assistive device utilized: {Assistive devices:23999} Level of assistance: {Levels of assistance:24026} Comments: ***   [facility] Adult PT Treatment:                                                DATE: 06/08/22 Therapeutic Exercise: *** Manual Therapy: *** Neuromuscular  re-ed: *** Therapeutic Activity: *** Modalities: *** Self Care: ***    PATIENT EDUCATION:  Education details: see treatment  Person educated: Patient Education method: Explanation, Demonstration, Tactile cues, Verbal cues, and Handouts Education comprehension: verbalized understanding, returned demonstration, verbal cues required, tactile cues required, and needs further education  HOME EXERCISE PROGRAM: ***  ASSESSMENT:  CLINICAL IMPRESSION: Patient is a 52 y.o. female who was seen today for physical therapy evaluation and treatment for chronic Rt hip pain.   OBJECTIVE IMPAIRMENTS: {opptimpairments:25111}.   ACTIVITY LIMITATIONS: {activitylimitations:27494}  PARTICIPATION LIMITATIONS: {participationrestrictions:25113}  PERSONAL FACTORS: {Personal factors:25162} are also affecting patient's functional outcome.   REHAB POTENTIAL: {rehabpotential:25112}  CLINICAL DECISION MAKING: {clinical decision making:25114}  EVALUATION COMPLEXITY: {Evaluation complexity:25115}   GOALS: Goals reviewed with patient? {yes/no:20286}  SHORT TERM GOALS: Target date: *** *** Baseline: Goal status: {GOALSTATUS:25110}  2.  *** Baseline:  Goal status: {GOALSTATUS:25110}  3.  *** Baseline:  Goal status: {GOALSTATUS:25110}  4.  *** Baseline:  Goal status: {GOALSTATUS:25110}  5.  *** Baseline:  Goal status: {GOALSTATUS:25110}  6.  *** Baseline:  Goal status: {GOALSTATUS:25110}  LONG TERM GOALS: Target date: ***  *** Baseline:  Goal status: {GOALSTATUS:25110}  2.  *** Baseline:  Goal status: {GOALSTATUS:25110}  3.  *** Baseline:  Goal status: {GOALSTATUS:25110}  4.  *** Baseline:  Goal status: {GOALSTATUS:25110}  5.  *** Baseline:  Goal status: {GOALSTATUS:25110}  6.  *** Baseline:  Goal status: {GOALSTATUS:25110}   PLAN:  PT FREQUENCY: {rehab frequency:25116}  PT DURATION: {rehab duration:25117}  PLANNED INTERVENTIONS: {rehab planned  interventions:25118::"Therapeutic exercises","Therapeutic activity","Neuromuscular re-education","Balance training","Gait training","Patient/Family education","Self Care","Joint mobilization"}  PLAN FOR NEXT SESSION: ***   Gwendolyn Grant, PT, DPT, ATC 06/07/22 2:53 PM

## 2022-06-08 ENCOUNTER — Ambulatory Visit: Payer: BC Managed Care – PPO | Attending: Family Medicine

## 2022-06-17 ENCOUNTER — Other Ambulatory Visit (HOSPITAL_COMMUNITY): Payer: Self-pay

## 2022-07-12 ENCOUNTER — Other Ambulatory Visit: Payer: Self-pay

## 2022-07-12 ENCOUNTER — Other Ambulatory Visit (HOSPITAL_COMMUNITY): Payer: Self-pay

## 2022-07-13 ENCOUNTER — Other Ambulatory Visit: Payer: Self-pay

## 2022-07-16 ENCOUNTER — Other Ambulatory Visit: Payer: Self-pay | Admitting: Family Medicine

## 2022-07-16 ENCOUNTER — Other Ambulatory Visit (HOSPITAL_COMMUNITY): Payer: Self-pay

## 2022-07-18 ENCOUNTER — Other Ambulatory Visit (HOSPITAL_COMMUNITY): Payer: Self-pay

## 2022-07-18 ENCOUNTER — Other Ambulatory Visit: Payer: Self-pay | Admitting: Family Medicine

## 2022-07-18 MED ORDER — FLUCONAZOLE 150 MG PO TABS
ORAL_TABLET | ORAL | 1 refills | Status: DC
  Filled 2022-07-18: qty 2, 3d supply, fill #0
  Filled 2022-08-24: qty 2, 3d supply, fill #1

## 2022-07-18 MED ORDER — TIRZEPATIDE 5 MG/0.5ML ~~LOC~~ SOAJ
5.0000 mg | SUBCUTANEOUS | 0 refills | Status: DC
  Filled 2022-07-18 – 2022-08-03 (×5): qty 2, 28d supply, fill #0

## 2022-07-18 NOTE — Telephone Encounter (Signed)
Spoke to pharmacy and she stated that the only issue she see is that they do not have it in stock and it is on backorder, estimated date is late April. [facility] in  [place] and [place], not in stock.

## 2022-07-18 NOTE — Progress Notes (Signed)
Spoke to pt.  Ok to change to mounjaro-will do 5mg  as on saxenda 3 right now.  May need to PA that as well.

## 2022-07-18 NOTE — Telephone Encounter (Signed)
Patient stated that when she spoke with pharmacy over the weekend, they stated that they had the lowest dose of mounjaro in stock, she would like to try mounjaro.

## 2022-07-19 ENCOUNTER — Other Ambulatory Visit (HOSPITAL_COMMUNITY): Payer: Self-pay

## 2022-07-21 ENCOUNTER — Other Ambulatory Visit (HOSPITAL_COMMUNITY): Payer: Self-pay

## 2022-07-25 ENCOUNTER — Other Ambulatory Visit (HOSPITAL_COMMUNITY): Payer: Self-pay

## 2022-07-26 ENCOUNTER — Other Ambulatory Visit (HOSPITAL_COMMUNITY): Payer: Self-pay

## 2022-07-26 ENCOUNTER — Ambulatory Visit (INDEPENDENT_AMBULATORY_CARE_PROVIDER_SITE_OTHER): Payer: BC Managed Care – PPO | Admitting: Dermatology

## 2022-07-26 ENCOUNTER — Encounter: Payer: Self-pay | Admitting: Dermatology

## 2022-07-26 DIAGNOSIS — L669 Cicatricial alopecia, unspecified: Secondary | ICD-10-CM | POA: Diagnosis not present

## 2022-07-26 DIAGNOSIS — L658 Other specified nonscarring hair loss: Secondary | ICD-10-CM

## 2022-07-26 DIAGNOSIS — L309 Dermatitis, unspecified: Secondary | ICD-10-CM | POA: Diagnosis not present

## 2022-07-26 MED ORDER — BETAMETHASONE DIPROPIONATE 0.05 % EX CREA
TOPICAL_CREAM | Freq: Two times a day (BID) | CUTANEOUS | 3 refills | Status: AC | PRN
  Filled 2022-07-26: qty 45, 14d supply, fill #0
  Filled 2022-11-25: qty 45, 14d supply, fill #1
  Filled 2023-01-21: qty 45, 14d supply, fill #2
  Filled 2023-04-25: qty 45, 30d supply, fill #3

## 2022-07-26 MED ORDER — BETAMETHASONE DIPROP-MINOXIDIL 0.05-7 % EX SOLN: 1.0000 | Freq: Every morning | CUTANEOUS | 2 refills | Status: DC

## 2022-07-26 NOTE — Progress Notes (Unsigned)
New Patient Visit  Subjective  Sandra Bird is a 52 y.o. female who presents for the following: Hair loss (Patient is here for hair loss. About three years ago, hair was coming out in chunks. Area of most concern is on the top. No longer coming out in chunks, generalized thinning now. Has tried OTC Kaleidescope shampoo, conditioner, and ointment with success in regrowth but hair would break off after growing back. ) and Rash (Rash on hands and elbows. Has had rash for a long time. PCP prescribed eucrisa but has stopped working. ).  Accompanied by husband  Objective  Well appearing patient in no apparent distress; mood and affect are within normal limits.  A focused examination was performed including scalp. Relevant physical exam findings are noted in the Assessment and Plan.  Hair line Hair thinning due to tight hairstyles or pulling of the hair  Scalp Hair loss with clinical signs of scarring   Left Hand - Posterior, Right Hand - Posterior Ill-defined pink papules/plaques with scale-crust    Assessment & Plan  Traction alopecia Hair line  Counseled patient explaining that traction alopecia is secondary to inflammation cause by repeated / chronic tension on the hair follicles. Treatment includes adjusting hair care practices, and controlling inflammation with topical and sometimes intralesional kenalog injection.   Hair growth can be achieved however we cannot predict or control how much hair grows back   Treatment Plan: -Pt to continue avoiding tight hairstyles -Rx's topical betamethsone and minoxidil daily  Cicatricial alopecia Scalp  Counseled the patient on the following:  The goal of therapy is to halt progression of disease and prevent further hair loss. In areas where the hair follicle has been replaced with fibrosis, regrowth is not possible. As the exact cause is not known, targeted therapy for CCCA is not available.  Treatment options for CCCA include  anti-inflammatory agents such as:  -Potent topical steroids (eg clobetasol) or intralesional steroids -Calcineurin inhibitors: tacrolimus ointment, pimecrolimus cream -Tetracyclines (eg doxycycline 100 mg twice daily, taken for several weeks to months) -Hydroxychloroquine -Hair transplantation can be considered in individuals with well-controlled CCCA for at least one year. However, graft survival is low.  Discontinuation of traumatic hair care practices is an essential aspect of treatment of CCCA.--> Pt currently natural and does not relax her hair or wear tight styles  Continue to Avoid tight braids and weaves/extensions Continue Avoid hair style practices associated with discomfort, scalp irritation or scale Hair transplantation can be considered in individuals with well-controlled CCCA that has been stable for at least one year. However, graft survival is low.   Treatment Plan:   -Pt will start with topical corticosteroid and minoxidil compound -If no improvement at next visit will discuss ILK  Eczema, unspecified type Left Hand - Posterior; Right Hand - Posterior  Plan: Counseling I counseled the patient regarding the following: Skin care: Patient should bathe using lukewarm water with a mild cleanser and moisturize immediately after. Emollients should be applied at least 2-3 times daily. Avoid scented detergents or fabric softeners. Keep fingernails short. Avoid excessive hand washing. Expectations: The patient is aware that eczema is chronic in nature and can improve with moisturizers and topical steroids and worsen with stress, scented soaps, detergents, scratching, dry skin, changes in weather and skin infections. Contact office if: Eczema worsens or fails to improve despite several weeks of treatment; patient develops skin infections (such as: yellow honey colored crusts or cold sores).  I recommended the following: Moisturizers  The following medication counseling was  provided: I discussed with the patient that prolonged use of topical steroids can result in the increased appearance of superficial blood vessels (telangiectasias), lightening (hypopigmentation) and thinning of the skin (atrophy). Patient understands to avoid using high potency steroids in skin folds, the groin or the face. The patient verbalized understanding of the proper use and possible adverse effects of topical steroids. All of the patient's questions and concerns were addressed.  Treatment Plan: - Betamethasone topical cream Bid for 2 weeks as needed for flares   Related Medications betamethasone dipropionate 0.05 % cream APPLY TO THE HANDS (RASH) TWICE DAILY AS NEEDED FOR TWO WEEKS THEN STOP   Return in about 3 months (around 10/25/2022) for hair loss.

## 2022-07-26 NOTE — Patient Instructions (Addendum)
What is central centrifugal cicatricial alopecia? Central Centrifugal Cicatricial Alopecia (CCCA) is a form of scarring alopecia on the scalp that results in permanent hair loss. It is the most common form of scarring hair loss seen in black women. However, it may be seen in men and among persons of all races and hair colour (though rarely). Middle-aged women are most commonly affected.  What is the cause of central centrifugal cicatricial alopecia? The exact cause of CCCA is unknown and is likely multifactorial. A genetic component has been suggested, with a link to mutations of the gene PADI3, which encodes peptidyl arginine deiminase, type III (PADI3), an enzyme that modifies proteins that are essential to formation of the hair-shaft. Hair care practices, such as the use of the hot comb, relaxers, tight extensions and weaves, have been implicated for decades, but studies have not shown a consistent link. Other proposed causative factors include fungal infections, bacterial infections, autoimmune disease, and genetics. One study has shown an association with medical conditions such as type 2 diabetes mellitus.  What patients often ask their dermatologist about [place] Board-certified dermatologists are the medical doctors who have the most experience diagnosing and treating hair loss, including CCCA. When patients see their dermatologist about CCCA, they often ask the following questions.  Why am I going bald in the center of my head? This type of hair loss often begins in the center of the scalp as a small, balding, and round patch that grows over time.  While more common in Black women, this type of hair loss develops in men and people of all races.  Central centrifugal cicatricial alopecia (CCCA) The first sign of CCCA is often noticeable hair loss in the center, or crown, of your scalp.  Woman with CCCA has hair loss in the center of her scalp If you have this type of hair loss, you want to  treat it early. Starting treatment early can prevent CCCA from spreading outward and causing more permanent hair loss. Some people also have hair regrowth when treatment starts early.  Early treatment is important because this disease destroys hair follicles. These are tiny pores (or openings) in your scalp from which your hair grows. Once a hair follicle has been destroyed, it is replaced by scar tissue. This is why hair loss can be permanent.  You can tell when scarring develops by looking at your scalp. After many hair follicles develop scars, you'll have a bald area that feels smooth to the touch.  Can CCCA be reversed? You may be able to reverse (or grow some hair) if you treat CCCA early before hair follicles develop scars. Once a hair follicle scars completely, treatment to regrow hair becomes difficult and hair loss is more likely to be permanent.  While treatment for CCCA may not always be able to reverse the disease and regrow hair, treatment can prevent CCCA from destroying more hair follicles. This means that the patch of hair loss that you have can remain the same size instead of getting larger. Without treatment, CCCA often continues to destroy hair follicles, and the patch of hair loss becomes larger and may eventually involve most of the scalp.  How is CCCA treated? You cannot effectively treat CCCA with hair loss treatments that you can buy online or in stores. A dermatologist must prescribe medication to treat this type of hair loss.  A dermatologist can also give you self-care tips that can make treatment more effective.  Even if you don't want to treat  the hair loss, it's important to see a board-certified dermatologist if you have patchy hair loss in the center of your head. Occasionally, CCCA is a sign of a medical problem like a thyroid condition. The hair loss could also be a sign that you need more iron or certain vitamins.  If you have noticeable hair loss on the top of  your head, dermatologists encourage you to make an appointment today. As tempting as it can be to hide a small area of hair loss, remember that the small area tends to get larger and larger without treatment.  Why does the baldness start from the top of the head? Exactly why CCCA usually starts on the top of the head is not completely understood.  In studying CCCA, dermatologists have learned that it is a unique type of hair loss that usually starts on the top of the head. As CCCA progresses, the round patch grows.  Studies have also found that:  Where this type of hair loss develops, there's inflammation.  CCCA is the most common type of scarring hair loss for women of African descent.  In the [place], it's the most frequent cause of scarring hair loss in [place] American women and usually begins during middle age.  CCCA runs in families.  Noticeable hair loss is one sign of CCCA. Some people feel small, raised bumps on their scalp. Many people who have untreated CCCA say that their scalp burns, stings, or itches.  You may develop other signs or symptoms. You'll find more information about these, along with pictures at Central centrifugal cicatricial alopecia: Symptoms.   Due to recent changes in healthcare laws, you may see results of your pathology and/or laboratory studies on MyChart before the doctors have had a chance to review them. We understand that in some cases there may be results that are confusing or concerning to you. Please understand that not all results are received at the same time and often the doctors may need to interpret multiple results in order to provide you with the best plan of care or course of treatment. Therefore, we ask that you please give [place] 2 business days to thoroughly review all your results before contacting the office for clarification. Should we see a critical lab result, you will be contacted sooner.   If You Need Anything After Your Visit  If  you have any questions or concerns for your doctor, please call our main line at [phone] If no one answers, please leave a voicemail as directed and we will return your call as soon as possible. Messages left after 4 pm will be answered the following business day.   You may also send [place] a message via [place]. We typically respond to MyChart messages within 1-2 business days.  For prescription refills, please ask your pharmacy to contact our office. Our fax number is [phone].  If you have an urgent issue when the clinic is closed that cannot wait until the next business day, you can page your doctor at the number below.    Please note that while we do our best to be available for urgent issues outside of office hours, we are not available 24/7.   If you have an urgent issue and are unable to reach [place], you may choose to seek medical care at your doctor's office, retail clinic, urgent care center, or emergency room.  If you have a medical emergency, please immediately call 911 or go to the emergency department. In the  event of inclement weather, please call our main line at [phone] for an update on the status of any delays or closures.  Dermatology Medication Tips: Please keep the boxes that topical medications come in in order to help keep track of the instructions about where and how to use these. Pharmacies typically print the medication instructions only on the boxes and not directly on the medication tubes.   If your medication is too expensive, please contact our office at [phone] or send [place] a message through [place].   We are unable to tell what your co-pay for medications will be in advance as this is different depending on your insurance coverage. However, we may be able to find a substitute medication at lower cost or fill out paperwork to get insurance to cover a needed medication.   If a prior authorization is required to get your medication covered by your  insurance company, please allow [place] 1-2 business days to complete this process.  Drug prices often vary depending on where the prescription is filled and some pharmacies may offer cheaper prices.  The website www.goodrx.com contains coupons for medications through different pharmacies. The prices here do not account for what the cost may be with help from insurance (it may be cheaper with your insurance), but the website can give you the price if you did not use any insurance.  - You can print the associated coupon and take it with your prescription to the pharmacy.  - You may also stop by our office during regular business hours and pick up a GoodRx coupon card.  - If you need your prescription sent electronically to a different pharmacy, notify our office through [facility] or by phone at [phone]    Your Prescription is a compound that went to a special pharmacy.  They will call you to schedule delivery.  If you dont hear from them in the next 48 hours here is the number to call.  [facility] [address], [address], [place] [address], [place] Directions [phone]

## 2022-07-27 ENCOUNTER — Other Ambulatory Visit (HOSPITAL_COMMUNITY): Payer: Self-pay

## 2022-07-27 ENCOUNTER — Encounter: Payer: Self-pay | Admitting: Dermatology

## 2022-07-28 ENCOUNTER — Telehealth: Payer: Self-pay | Admitting: Dermatology

## 2022-07-28 NOTE — Telephone Encounter (Signed)
The number listed for Sandra Bird above says "voicemailbox is full" I called the [facility] where we sent the rx and they said they have the prescription in the system.  Will attempt to call Pharmaist Sandra Bird again later

## 2022-07-28 NOTE — Telephone Encounter (Signed)
Looking for RX for prescribed hair compound. Please call Pharmacist Elmarie Shiley [phone] to give verbal order.

## 2022-08-03 ENCOUNTER — Other Ambulatory Visit (HOSPITAL_COMMUNITY): Payer: Self-pay

## 2022-08-03 MED ORDER — OXYCODONE-ACETAMINOPHEN 5-325 MG PO TABS
1.0000 | ORAL_TABLET | Freq: Three times a day (TID) | ORAL | 0 refills | Status: DC | PRN
  Filled 2022-08-03: qty 21, 7d supply, fill #0

## 2022-08-08 ENCOUNTER — Other Ambulatory Visit (HOSPITAL_COMMUNITY): Payer: Self-pay

## 2022-08-09 ENCOUNTER — Other Ambulatory Visit (HOSPITAL_COMMUNITY): Payer: Self-pay

## 2022-08-09 MED ORDER — VITAMIN D (ERGOCALCIFEROL) 1.25 MG (50000 UNIT) PO CAPS
50000.0000 [IU] | ORAL_CAPSULE | ORAL | 2 refills | Status: DC
  Filled 2022-08-09: qty 12, 84d supply, fill #0
  Filled 2022-10-29: qty 12, 84d supply, fill #1
  Filled 2023-01-26: qty 12, 84d supply, fill #2

## 2022-08-09 MED ORDER — NALOXONE HCL 4 MG/0.1ML NA LIQD
NASAL | 1 refills | Status: DC
  Filled 2022-08-09 – 2023-01-26 (×2): qty 2, 1d supply, fill #0

## 2022-08-09 MED ORDER — OXYCODONE-ACETAMINOPHEN 10-325 MG PO TABS
1.0000 | ORAL_TABLET | Freq: Three times a day (TID) | ORAL | 0 refills | Status: DC | PRN
  Filled 2022-08-09: qty 90, 30d supply, fill #0

## 2022-08-16 ENCOUNTER — Ambulatory Visit: Payer: BC Managed Care – PPO | Admitting: Dermatology

## 2022-08-24 ENCOUNTER — Other Ambulatory Visit (HOSPITAL_COMMUNITY): Payer: Self-pay

## 2022-08-24 ENCOUNTER — Other Ambulatory Visit: Payer: Self-pay

## 2022-08-24 MED ORDER — METRONIDAZOLE 500 MG PO TABS
500.0000 mg | ORAL_TABLET | Freq: Two times a day (BID) | ORAL | 0 refills | Status: DC
  Filled 2022-08-24: qty 14, 7d supply, fill #0

## 2022-08-24 MED ORDER — FLUCONAZOLE 150 MG PO TABS
150.0000 mg | ORAL_TABLET | ORAL | 0 refills | Status: DC
  Filled 2022-08-24: qty 2, 4d supply, fill #0

## 2022-08-25 ENCOUNTER — Other Ambulatory Visit (HOSPITAL_COMMUNITY): Payer: Self-pay

## 2022-08-25 ENCOUNTER — Other Ambulatory Visit: Payer: Self-pay

## 2022-08-26 ENCOUNTER — Other Ambulatory Visit (HOSPITAL_COMMUNITY): Payer: Self-pay

## 2022-08-26 MED ORDER — SULFAMETHOXAZOLE-TRIMETHOPRIM 800-160 MG PO TABS
1.0000 | ORAL_TABLET | Freq: Two times a day (BID) | ORAL | 0 refills | Status: DC
  Filled 2022-08-26: qty 10, 5d supply, fill #0

## 2022-08-31 ENCOUNTER — Ambulatory Visit: Payer: BC Managed Care – PPO | Admitting: Family Medicine

## 2022-08-31 ENCOUNTER — Other Ambulatory Visit (HOSPITAL_COMMUNITY): Payer: Self-pay

## 2022-08-31 ENCOUNTER — Encounter: Payer: Self-pay | Admitting: Family Medicine

## 2022-08-31 VITALS — BP 130/86 | HR 80 | Temp 98.3°F | Ht 62.0 in | Wt 236.0 lb

## 2022-08-31 DIAGNOSIS — Z6841 Body Mass Index (BMI) 40.0 and over, adult: Secondary | ICD-10-CM | POA: Diagnosis not present

## 2022-08-31 MED ORDER — ZEPBOUND 5 MG/0.5ML ~~LOC~~ SOAJ
5.0000 mg | SUBCUTANEOUS | 0 refills | Status: DC
  Filled 2022-08-31: qty 2, 28d supply, fill #0

## 2022-08-31 NOTE — Patient Instructions (Signed)
It was very nice to see you today!  Trying for zepbound again.      PLEASE NOTE:  If you had any lab tests please let us know if you have not heard back within a few days. You may see your results on MyChart before we have a chance to review them but we will give you a call once they are reviewed by [place]. If we ordered any referrals today, please let us know if you have not heard from their office within the next week.   Please try these tips to maintain a healthy lifestyle:  Eat most of your calories during the day when you are active. Eliminate processed foods including packaged sweets (pies, cakes, cookies), reduce intake of potatoes, white bread, white pasta, and white rice. Look for whole grain options, oat flour or almond flour.  Each meal should contain half fruits/vegetables, one quarter protein, and one quarter carbs (no bigger than a computer mouse).  Cut down on sweet beverages. This includes juice, soda, and sweet tea. Also watch fruit intake, though this is a healthier sweet option, it still contains natural sugar! Limit to 3 servings daily.  Drink at least 1 glass of water with each meal and aim for at least 8 glasses per day  Exercise at least 150 minutes every week.

## 2022-08-31 NOTE — Progress Notes (Signed)
Subjective:     Patient ID: Sandra Bird, female    DOB: 1970-06-18, 52 y.o.   MRN: [id number]  Chief Complaint  Patient presents with   Weight Check    3 month follow-up on weight Medication refill, testosterone cream    HPI Obesity-going to gym 3x/week(s) or more. Taking Saxenda and phentermine.  Losing inches but weight not changing much.  Gets hungry at night time and some bad choices.   Needs refill on testosterone 4% 40 mg/mL 2 clicks/day.  [facility]  [facility]  Health Maintenance Due  Topic Date Due   DTaP/Tdap/Td (1 - Tdap) Never done   COLONOSCOPY (Pts 45-41yrs Insurance coverage will need to be confirmed)  Never done   PAP SMEAR-Modifier  04/04/2017    Past Medical History:  Diagnosis Date   Arthritis    SVD (spontaneous vaginal delivery)    x 3    Past Surgical History:  Procedure Laterality Date   ABDOMINOPLASTY/PANNICULECTOMY     CHOLECYSTECTOMY     DILITATION & CURRETTAGE/HYSTROSCOPY WITH NOVASURE ABLATION N/A 07/25/2013   Procedure: DILATATION & CURETTAGE/HYSTEROSCOPY WITH NOVASURE ABLATION;  Surgeon: Miguel Aschoff, MD;  Location: [facility];  Service: Gynecology;  Laterality: N/A;   TONSILLECTOMY     TOTAL HIP ARTHROPLASTY Right 03/24/2020   Procedure: RIGHT TOTAL HIP ARTHROPLASTY ANTERIOR APPROACH;  Surgeon: Marcene Corning, MD;  Location: [facility];  Service: Orthopedics;  Laterality: Right;   TUBAL LIGATION     WISDOM TOOTH EXTRACTION       Current Outpatient Medications:    albuterol (VENTOLIN HFA) 108 (90 Base) MCG/ACT inhaler, Inhale 2 puffs into the lungs every 6 (six) hours as needed for wheezing or shortness of breath., Disp: 6.7 g, Rfl: 0   Betamethasone Diprop-Minoxidil 0.05-7 % SOLN, Apply 1 Application topically in the morning., Disp: 60 g, Rfl: 2   betamethasone dipropionate 0.05 % cream, APPLY TO THE HANDS (RASH) TWICE DAILY AS NEEDED FOR TWO WEEKS THEN STOP, Disp: 45 g, Rfl: 3   Boric Acid Vaginal (AZO BORIC ACID) 600  MG SUPP, Compounded Boric Acid 600mg  vaginal capsules  Boric Acid 600mg  vaginal capsules.  DO NOT TAKE ORALLY/TOXIC  insert 1 suppository weekly x 6 weeks; then use prn, Disp: , Rfl:    clobetasol cream (TEMOVATE) 0.05 %, Apply to affected area two times daily, Disp: 15 g, Rfl: 1   clotrimazole-betamethasone (LOTRISONE) cream, APPLY A THIN COAT DAILY AS NEEDED, Disp: 45 g, Rfl: 2   Crisaborole (EUCRISA) 2 % OINT, Use 1 (one) Application two times daily, Disp: 60 g, Rfl: 1   Insulin Pen Needle (TECHLITE PEN NEEDLES) 32G X 4 MM MISC, 1 each by Does not apply route daily in the afternoon., Disp: 100 each, Rfl: 1   latanoprost (XALATAN) 0.005 % ophthalmic solution, Place 1 drop into both eyes at bedtime, Disp: 7.5 mL, Rfl: 3   linaclotide (LINZESS) 145 MCG CAPS capsule, Take 1 capsule (145 mcg total) by mouth daily. Before the first meal of the day on and empty stomach., Disp: 30 capsule, Rfl: 6   Liraglutide -Weight Management (SAXENDA) 18 MG/3ML SOPN, Inject 3 mg into the skin daily., Disp: 45 mL, Rfl: 0   naloxone (NARCAN) nasal spray 4 mg/0.1 mL, 1 spray every 3 minutes; spray 1 dose into ONE nostril; alternate nostrils w each dose until help arrives, Disp: 2 each, Rfl: 1   oxyCODONE-acetaminophen (PERCOCET) 10-325 MG tablet, Take 1 tablet by mouth 3 (three) times daily as  needed., Disp: 90 tablet, Rfl: 0   pantoprazole (PROTONIX) 40 MG tablet, Take 1 tablet (40 mg total) by mouth daily., Disp: 30 tablet, Rfl: 5   phentermine (ADIPEX-P) 37.5 MG tablet, Take 1 tablet (37.5 mg total) by mouth daily before breakfast., Disp: 30 tablet, Rfl: 2   tirzepatide (ZEPBOUND) 5 MG/0.5ML Pen, Inject 5 mg into the skin once a week., Disp: 2 mL, Rfl: 0   Vitamin D, Ergocalciferol, (DRISDOL) 1.25 MG (50000 UNIT) CAPS capsule, Take 1 capsule (50,000 Units total) by mouth once a week., Disp: 12 capsule, Rfl: 2  Allergies  Allergen Reactions   Penicillins Rash and Other (See Comments)    Has patient had a PCN reaction  causing immediate rash, facial/tongue/throat swelling, SOB or lightheadedness with hypotension: Yes Has patient had a PCN reaction causing severe rash involving mucus membranes or skin necrosis: No Has patient had a PCN reaction that required hospitalization No Has patient had a PCN reaction occurring within the last 10 years: No If all of the above answers are "NO", then may proceed with Cephalosporin use.    ROS neg/noncontributory except as noted HPI/below      Objective:     BP 130/86   Pulse 80   Temp 98.3 F (36.8 C) (Temporal)   Ht 5\' 2"  (1.575 m)   Wt 236 lb (107 kg)   SpO2 99%   BMI 43.16 kg/m  Wt Readings from Last 3 Encounters:  08/31/22 236 lb (107 kg)  06/01/22 239 lb 4 oz (108.5 kg)  04/28/22 243 lb 4 oz (110.3 kg)    Physical Exam   Gen: WDWN NAD HEENT: NCAT, conjunctiva not injected, sclera nonicteric NECK:  supple, no thyromegaly, no nodes, no carotid bruits CARDIAC: RRR, S1S2+, no murmur. DP 2+B LUNGS: CTAB. No wheezes EXT:  no edema MSK: no gross abnormalities.  NEURO: A&O x3.  CN II-XII intact.  PSYCH: normal mood. Good eye contact  PDMP checked     Assessment & Plan:  Class 3 severe obesity due to excess calories without serious comorbidity with body mass index (BMI) of 40.0 to 44.9 in adult [facility])  Other orders -     Zepbound; Inject 5 mg into the skin once a week.  Dispense: 2 mL; Refill: 0   Obesity-working on diet/exercise.  Taking saxenda and helps, but very slow progress.  Phentermine-thinks helps but weight not down much.  Per patient, losing inches.  Will try to get zepbound approved 5 mg weekly and call monthly for titration  discussed increasing exercise again and trying a larger/healthy meal at supper and high protein snack near bedtime so doesn't crave as much.   Follow up 10mo Low libido-doing better on compounded testosterone.  PDMP checked.  Hand written prescription done for 5 months.   Angelena Sole, MD

## 2022-09-07 ENCOUNTER — Other Ambulatory Visit: Payer: Self-pay

## 2022-09-07 ENCOUNTER — Other Ambulatory Visit (HOSPITAL_COMMUNITY): Payer: Self-pay

## 2022-09-07 MED ORDER — OXYCODONE-ACETAMINOPHEN 10-325 MG PO TABS
1.0000 | ORAL_TABLET | Freq: Three times a day (TID) | ORAL | 0 refills | Status: DC | PRN
  Filled 2022-09-07: qty 90, 30d supply, fill #0

## 2022-09-26 ENCOUNTER — Other Ambulatory Visit (HOSPITAL_COMMUNITY): Payer: Self-pay

## 2022-09-26 MED ORDER — FLUCONAZOLE 150 MG PO TABS
150.0000 mg | ORAL_TABLET | ORAL | 0 refills | Status: AC
  Filled 2022-09-26: qty 2, 2d supply, fill #0

## 2022-09-26 MED ORDER — METRONIDAZOLE 500 MG PO TABS
500.0000 mg | ORAL_TABLET | Freq: Two times a day (BID) | ORAL | 0 refills | Status: DC
  Filled 2022-09-26: qty 14, 7d supply, fill #0

## 2022-09-27 ENCOUNTER — Other Ambulatory Visit (HOSPITAL_COMMUNITY): Payer: Self-pay

## 2022-09-27 ENCOUNTER — Other Ambulatory Visit: Payer: Self-pay | Admitting: Family Medicine

## 2022-09-27 MED ORDER — PHENTERMINE HCL 37.5 MG PO TABS: 37.5000 mg | ORAL_TABLET | Freq: Every day | ORAL | 2 refills | Status: DC

## 2022-09-28 ENCOUNTER — Other Ambulatory Visit (HOSPITAL_COMMUNITY): Payer: Self-pay

## 2022-09-28 LAB — HM PAP SMEAR: HM Pap smear: NEGATIVE

## 2022-09-28 MED ORDER — METRONIDAZOLE 0.75 % VA GEL
VAGINAL | 0 refills | Status: DC
  Filled 2022-09-28: qty 70, 5d supply, fill #0

## 2022-10-04 ENCOUNTER — Other Ambulatory Visit (HOSPITAL_COMMUNITY): Payer: Self-pay

## 2022-10-05 ENCOUNTER — Other Ambulatory Visit (HOSPITAL_COMMUNITY): Payer: Self-pay

## 2022-10-07 ENCOUNTER — Other Ambulatory Visit (HOSPITAL_COMMUNITY): Payer: Self-pay

## 2022-10-07 MED ORDER — OXYCODONE-ACETAMINOPHEN 10-325 MG PO TABS
1.0000 | ORAL_TABLET | Freq: Three times a day (TID) | ORAL | 0 refills | Status: DC | PRN
  Filled 2022-10-07: qty 90, 30d supply, fill #0

## 2022-10-10 ENCOUNTER — Other Ambulatory Visit (HOSPITAL_COMMUNITY): Payer: Self-pay

## 2022-10-10 ENCOUNTER — Other Ambulatory Visit: Payer: Self-pay | Admitting: Family Medicine

## 2022-10-12 ENCOUNTER — Other Ambulatory Visit (HOSPITAL_COMMUNITY): Payer: Self-pay

## 2022-10-13 ENCOUNTER — Other Ambulatory Visit (HOSPITAL_COMMUNITY): Payer: Self-pay

## 2022-10-13 ENCOUNTER — Other Ambulatory Visit: Payer: Self-pay | Admitting: Family Medicine

## 2022-10-13 MED ORDER — PHENTERMINE HCL 37.5 MG PO TABS
37.5000 mg | ORAL_TABLET | Freq: Every day | ORAL | 1 refills | Status: DC
  Filled 2022-10-13: qty 30, 30d supply, fill #0
  Filled 2022-11-11 (×2): qty 30, 30d supply, fill #1

## 2022-10-14 ENCOUNTER — Other Ambulatory Visit (HOSPITAL_COMMUNITY): Payer: Self-pay

## 2022-10-18 ENCOUNTER — Encounter: Payer: Self-pay | Admitting: Dermatology

## 2022-10-18 ENCOUNTER — Other Ambulatory Visit (HOSPITAL_COMMUNITY): Payer: Self-pay

## 2022-10-18 ENCOUNTER — Ambulatory Visit (INDEPENDENT_AMBULATORY_CARE_PROVIDER_SITE_OTHER): Payer: BC Managed Care – PPO | Admitting: Dermatology

## 2022-10-18 VITALS — BP 103/66 | HR 81

## 2022-10-18 DIAGNOSIS — L219 Seborrheic dermatitis, unspecified: Secondary | ICD-10-CM | POA: Diagnosis not present

## 2022-10-18 DIAGNOSIS — L669 Cicatricial alopecia, unspecified: Secondary | ICD-10-CM

## 2022-10-18 DIAGNOSIS — L658 Other specified nonscarring hair loss: Secondary | ICD-10-CM

## 2022-10-18 MED ORDER — BETAMETHASONE DIPROP-MINOXIDIL 0.05-7 % EX SOLN
1.0000 | Freq: Every morning | CUTANEOUS | 2 refills | Status: DC
  Filled 2022-10-18: qty 60, fill #0

## 2022-10-18 MED ORDER — BETAMETHASONE DIPROP-MINOXIDIL 0.05-7 % EX SOLN: CUTANEOUS | 4 refills | Status: DC

## 2022-10-18 NOTE — Progress Notes (Signed)
   Follow-Up Visit   Subjective  Sandra Bird is a 52 y.o. female who presents for the following: Traction alopecia and CCCA. Pt had been using Betamethasone-minoxidil compound solution once a day and states that it has helped. She says she ran out of drops about a month ago. She never got DHS or viviscal. She did dry needle and red light therapy yesterday to break up scar tissue and help re-growth.   The following portions of the chart were reviewed this encounter and updated as appropriate: medications, allergies, medical history  Review of Systems:  No other skin or systemic complaints except as noted in HPI or Assessment and Plan.  Objective  Well appearing patient in no apparent distress; mood and affect are within normal limits.   A focused examination was performed of the following areas: scalp  Hair: Noted regrowth in some areas of the scalp, particularly where previously prescribed betamethasone minoxidil was applied.   Scalp: Presence of scarring noted in certain areas, indicating non-reversible hair loss in those regions.  Excessive tightness of hairstyles was observed during this visit.        Assessment & Plan   Visit Summary: Sandra Bird returned for a 3 month  follow-up on her hair loss treatment. She has been using betamethasone minoxidil but ran out a month ago, noticing some regrowth. She recently started micro-needling and red light therapy and is scheduled for another session in two weeks. She has not obtained the previously recommended Viviscal supplements or DHS shampoo. The plan includes continuing betamethasone minoxidil, starting Viviscal supplements, and considering doing the needling and redlight therapy monthly. Follow-up is scheduled in four months to assess progress.   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 Renew prescription with four refills.    - Avoid tight  hairstyles to prevent further hair loss.    - Start taking Viviscal supplements to support stronger and fuller hair growth.  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 Renew prescription with four refills.    - Monitor the effectiveness of micro-needling and red light therapy sessions, with follow-up appointments every month instead of every two weeks.    - Mirconeedling therapy may provide limited benefits for CCCA on the crown due to scarring; consider the cost-benefit ratio before proceeding.    - Start taking Viviscal supplements to support stronger and fuller hair growth.  3. Seborrheic Dermatitis:    - Patient did not obtain the recommended DHS shampoo. Encourage the patient to use DHS shampoo to manage flaking and itching.    - Monitor scalp itchiness and adjust treatment as needed.  Follow-up: - Schedule a follow-up appointment in four months to assess hair growth progress and compare with previous pictures. - Reevaluate the need for additional treatments or adjustments to the current treatment plan at the follow-up appointment.   No follow-ups on file.  Owens Shark, CMA, am acting as scribe for [employer], DO.   Documentation: I have reviewed the above documentation for accuracy and completeness, and I agree with the above.  Langston Reusing, DO

## 2022-10-18 NOTE — Patient Instructions (Addendum)
Here is a summary of the key instructions and recommendations from today's consultation:  - Medications and Treatments:   - Continue using Betamethasone Minoxidil 7% compound drops. A new prescription with four refills has been sent to your [place] pharmacy.   - Start taking Viviscal supplements to support stronger, fuller hair growth.   - Continue with micro-needling and red light therapy sessions. I recommend doing treatment sessions every month instead of every 2 weeksand are beneficial for areas with active hair follicles.  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   - Limit the use of heating combs to once every two weeks to minimize heat damage.  - Follow-Up Care:   - We will monitor your progress and compare treatment results at your next appointment in four months.   Please ensure to follow these instructions closely to maximize your treatment outcomes. If you have any questions or need further assistance, do not hesitate to contact our office.      Due to recent changes in healthcare laws, you may see results of your pathology and/or laboratory studies on MyChart before the doctors have had a chance to review them. We understand that in some cases there may be results that are confusing or concerning to you. Please understand that not all results are received at the same time and often the doctors may need to interpret multiple results in order to provide you with the best plan of care or course of treatment. Therefore, we ask that you please give [place] 2 business days to thoroughly review all your results before contacting the office for clarification. Should we see a critical lab result, you will be contacted sooner.   If You Need Anything After Your Visit  If you have any questions or concerns for your doctor, please call our main line at [phone] If no one answers, please leave a voicemail as directed and we will return your  call as soon as possible. Messages left after 4 pm will be answered the following business day.   You may also send [place] a message via MyChart. We typically respond to MyChart messages within 1-2 business days.  For prescription refills, please ask your pharmacy to contact our office. Our fax number is [phone].  If you have an urgent issue when the clinic is closed that cannot wait until the next business day, you can page your doctor at the number below.    Please note that while we do our best to be available for urgent issues outside of office hours, we are not available 24/7.   If you have an urgent issue and are unable to reach [place], you may choose to seek medical care at your doctor's office, retail clinic, urgent care center, or emergency room.  If you have a medical emergency, please immediately call 911 or go to the emergency department. In the event of inclement weather, please call our main line at [phone] for an update on the status of any delays or closures.  Dermatology Medication Tips: Please keep the boxes that topical medications come in in order to help keep track of the instructions about where and how to use these. Pharmacies typically print the medication instructions only on the boxes and not directly on the medication tubes.   If your medication is too expensive, please contact our office at [phone] or send [place] a message through MyChart.   We are unable to tell what your co-pay for  medications will be in advance as this is different depending on your insurance coverage. However, we may be able to find a substitute medication at lower cost or fill out paperwork to get insurance to cover a needed medication.   If a prior authorization is required to get your medication covered by your insurance company, please allow [place] 1-2 business days to complete this process.  Drug prices often vary depending on where the prescription is filled and some pharmacies may offer  cheaper prices.  The website www.goodrx.com contains coupons for medications through different pharmacies. The prices here do not account for what the cost may be with help from insurance (it may be cheaper with your insurance), but the website can give you the price if you did not use any insurance.  - You can print the associated coupon and take it with your prescription to the pharmacy.  - You may also stop by our office during regular business hours and pick up a GoodRx coupon card.  - If you need your prescription sent electronically to a different pharmacy, notify our office through [facility] or by phone at [phone]

## 2022-10-19 ENCOUNTER — Other Ambulatory Visit (HOSPITAL_COMMUNITY): Payer: Self-pay

## 2022-10-19 ENCOUNTER — Other Ambulatory Visit (HOSPITAL_BASED_OUTPATIENT_CLINIC_OR_DEPARTMENT_OTHER): Payer: Self-pay

## 2022-10-19 ENCOUNTER — Other Ambulatory Visit: Payer: Self-pay | Admitting: Dermatology

## 2022-10-19 MED ORDER — BETAMETHASONE DIPROP-MINOXIDIL 0.05-7 % EX SOLN: 1.0000 | Freq: Every morning | CUTANEOUS | 2 refills | Status: DC

## 2022-10-25 ENCOUNTER — Ambulatory Visit: Payer: BC Managed Care – PPO | Admitting: Dermatology

## 2022-10-25 ENCOUNTER — Other Ambulatory Visit (HOSPITAL_COMMUNITY): Payer: Self-pay

## 2022-10-26 ENCOUNTER — Other Ambulatory Visit: Payer: Self-pay | Admitting: Family Medicine

## 2022-10-26 ENCOUNTER — Other Ambulatory Visit (HOSPITAL_COMMUNITY): Payer: Self-pay

## 2022-10-26 MED ORDER — PANTOPRAZOLE SODIUM 40 MG PO TBEC
40.0000 mg | DELAYED_RELEASE_TABLET | Freq: Every day | ORAL | 2 refills | Status: DC
  Filled 2022-10-26: qty 30, 30d supply, fill #0
  Filled 2022-12-14: qty 30, 30d supply, fill #1
  Filled 2023-01-26: qty 30, 30d supply, fill #2

## 2022-10-28 ENCOUNTER — Other Ambulatory Visit: Payer: Self-pay

## 2022-10-28 ENCOUNTER — Other Ambulatory Visit (HOSPITAL_COMMUNITY): Payer: Self-pay

## 2022-11-03 ENCOUNTER — Other Ambulatory Visit (HOSPITAL_COMMUNITY): Payer: Self-pay

## 2022-11-03 MED ORDER — OXYCODONE-ACETAMINOPHEN 10-325 MG PO TABS
1.0000 | ORAL_TABLET | Freq: Three times a day (TID) | ORAL | 0 refills | Status: DC | PRN
  Filled 2022-11-07: qty 90, 30d supply, fill #0

## 2022-11-04 ENCOUNTER — Other Ambulatory Visit (HOSPITAL_COMMUNITY): Payer: Self-pay

## 2022-11-07 ENCOUNTER — Other Ambulatory Visit (HOSPITAL_COMMUNITY): Payer: Self-pay

## 2022-11-07 MED ORDER — FLUCONAZOLE 150 MG PO TABS
ORAL_TABLET | ORAL | 0 refills | Status: DC
  Filled 2022-11-07: qty 2, 3d supply, fill #0

## 2022-11-08 ENCOUNTER — Other Ambulatory Visit (HOSPITAL_COMMUNITY): Payer: Self-pay

## 2022-11-08 ENCOUNTER — Other Ambulatory Visit: Payer: Self-pay | Admitting: Family Medicine

## 2022-11-08 NOTE — Telephone Encounter (Signed)
Left message to return my call.  

## 2022-11-09 ENCOUNTER — Telehealth: Payer: Self-pay | Admitting: Family Medicine

## 2022-11-09 ENCOUNTER — Other Ambulatory Visit (HOSPITAL_COMMUNITY): Payer: Self-pay

## 2022-11-09 ENCOUNTER — Other Ambulatory Visit: Payer: Self-pay | Admitting: Family Medicine

## 2022-11-09 MED ORDER — CELECOXIB 200 MG PO CAPS
200.0000 mg | ORAL_CAPSULE | Freq: Every day | ORAL | 0 refills | Status: DC | PRN
  Filled 2022-11-09: qty 90, 90d supply, fill #0

## 2022-11-09 NOTE — Telephone Encounter (Signed)
 Patient notified and verbalized understanding. 

## 2022-11-09 NOTE — Telephone Encounter (Signed)
Spoke with patient concerning Celebrex and she stated that she is still taking medication and requested the refill.

## 2022-11-09 NOTE — Telephone Encounter (Signed)
Tried calling patient, sound like someone picked up and then hung up.

## 2022-11-09 NOTE — Telephone Encounter (Signed)
Patient states she was returning Quaneisha's call. States she is available for call back.

## 2022-11-09 NOTE — Telephone Encounter (Signed)
Patient requesting refill of Phentermine, stating insurance does not want to pay for Saxenda. Also requesting refill of Celecoxib 200 mg, stated that she is still taking medication for pain, has one left. Patient uses [facility].

## 2022-11-10 ENCOUNTER — Other Ambulatory Visit (HOSPITAL_COMMUNITY): Payer: Self-pay

## 2022-11-11 ENCOUNTER — Other Ambulatory Visit (HOSPITAL_COMMUNITY): Payer: Self-pay

## 2022-11-26 ENCOUNTER — Other Ambulatory Visit (HOSPITAL_COMMUNITY): Payer: Self-pay

## 2022-12-01 ENCOUNTER — Other Ambulatory Visit (HOSPITAL_COMMUNITY): Payer: Self-pay

## 2022-12-01 MED ORDER — OXYCODONE-ACETAMINOPHEN 10-325 MG PO TABS
1.0000 | ORAL_TABLET | Freq: Three times a day (TID) | ORAL | 0 refills | Status: DC | PRN
  Filled 2022-12-07: qty 90, 30d supply, fill #0

## 2022-12-02 LAB — LAB REPORT - SCANNED: Creatinine, POC: 32.1 mg/dL

## 2022-12-07 ENCOUNTER — Other Ambulatory Visit (HOSPITAL_COMMUNITY): Payer: Self-pay

## 2022-12-08 ENCOUNTER — Encounter (INDEPENDENT_AMBULATORY_CARE_PROVIDER_SITE_OTHER): Payer: Self-pay

## 2022-12-14 ENCOUNTER — Other Ambulatory Visit: Payer: Self-pay | Admitting: Family Medicine

## 2022-12-14 ENCOUNTER — Other Ambulatory Visit (HOSPITAL_COMMUNITY): Payer: Self-pay

## 2022-12-14 MED ORDER — PHENTERMINE HCL 37.5 MG PO TABS
37.5000 mg | ORAL_TABLET | Freq: Every day | ORAL | 0 refills | Status: DC
  Filled 2022-12-14: qty 30, 30d supply, fill #0

## 2022-12-15 ENCOUNTER — Other Ambulatory Visit: Payer: Self-pay

## 2022-12-15 ENCOUNTER — Other Ambulatory Visit (HOSPITAL_COMMUNITY): Payer: Self-pay

## 2022-12-23 ENCOUNTER — Other Ambulatory Visit (HOSPITAL_COMMUNITY): Payer: Self-pay

## 2023-01-04 ENCOUNTER — Other Ambulatory Visit (HOSPITAL_COMMUNITY): Payer: Self-pay

## 2023-01-04 ENCOUNTER — Encounter: Payer: Self-pay | Admitting: Family Medicine

## 2023-01-04 ENCOUNTER — Other Ambulatory Visit: Payer: Self-pay

## 2023-01-04 ENCOUNTER — Ambulatory Visit: Payer: Medicare Other | Admitting: Family Medicine

## 2023-01-04 VITALS — BP 110/70 | HR 75 | Temp 98.3°F | Resp 18 | Ht 62.0 in | Wt 232.2 lb

## 2023-01-04 DIAGNOSIS — M1611 Unilateral primary osteoarthritis, right hip: Secondary | ICD-10-CM | POA: Diagnosis not present

## 2023-01-04 DIAGNOSIS — Z6841 Body Mass Index (BMI) 40.0 and over, adult: Secondary | ICD-10-CM

## 2023-01-04 DIAGNOSIS — G4700 Insomnia, unspecified: Secondary | ICD-10-CM | POA: Diagnosis not present

## 2023-01-04 MED ORDER — CELECOXIB 200 MG PO CAPS
200.0000 mg | ORAL_CAPSULE | Freq: Every day | ORAL | 1 refills | Status: DC | PRN
  Filled 2023-01-04 – 2023-01-26 (×2): qty 90, 90d supply, fill #0
  Filled 2023-04-21: qty 90, 90d supply, fill #1

## 2023-01-04 MED ORDER — PHENTERMINE HCL 37.5 MG PO TABS
37.5000 mg | ORAL_TABLET | Freq: Every day | ORAL | 2 refills | Status: DC
Start: 2023-01-04 — End: 2023-04-05
  Filled 2023-01-04 – 2023-01-26 (×2): qty 30, 30d supply, fill #0
  Filled 2023-02-28: qty 30, 30d supply, fill #1

## 2023-01-04 MED ORDER — HYDROXYZINE PAMOATE 25 MG PO CAPS
25.0000 mg | ORAL_CAPSULE | Freq: Three times a day (TID) | ORAL | 1 refills | Status: DC | PRN
  Filled 2023-01-04: qty 30, 10d supply, fill #0

## 2023-01-04 NOTE — Patient Instructions (Addendum)
It was very nice to see you today!  Send copy of labs  Take 2 wk break from phentermine   PLEASE NOTE:  If you had any lab tests please let us know if you have not heard back within a few days. You may see your results on MyChart before we have a chance to review them but we will give you a call once they are reviewed by [place]. If we ordered any referrals today, please let us know if you have not heard from their office within the next week.   Please try these tips to maintain a healthy lifestyle:  Eat most of your calories during the day when you are active. Eliminate processed foods including packaged sweets (pies, cakes, cookies), reduce intake of potatoes, white bread, white pasta, and white rice. Look for whole grain options, oat flour or almond flour.  Each meal should contain half fruits/vegetables, one quarter protein, and one quarter carbs (no bigger than a computer mouse).  Cut down on sweet beverages. This includes juice, soda, and sweet tea. Also watch fruit intake, though this is a healthier sweet option, it still contains natural sugar! Limit to 3 servings daily.  Drink at least 1 glass of water with each meal and aim for at least 8 glasses per day  Exercise at least 150 minutes every week.

## 2023-01-04 NOTE — Progress Notes (Signed)
Subjective:     Patient ID: Sandra Bird, female    DOB: Feb 06, 1971, 52 y.o.   MRN: [id number]  Chief Complaint  Patient presents with   Follow-up    Follow-up on weight Not sleeping well at night Lost celebrex, need refill    HPI Weight - Pt is on  phentermine 37.5 mg daily. States it does help suppress her appetite. Has noticed she has not lost much and her weight is stagnant. States she is exercising the best she can given limitations of hip pain..  was 255 1 yr ago  insurance quit paying for GLP-1  Sleep - Reports decreased sleep over the past several weeks. States she has been stressed more recently, partially attributed to family issues. Endorses trouble falling asleep and staying asleep. No SI  Pain - Complaint with 200 mg Celebrex daily. [place]brex [place] the [place].   Pap in December.   Health Maintenance Due  Topic Date Due   Medicare Annual Wellness (AWV)  Never done   DTaP/Tdap/Td (1 - Tdap) Never done   PAP SMEAR-Modifier  04/04/2017    Past Medical History:  Diagnosis Date   Arthritis    SVD (spontaneous vaginal delivery)    x 3    Past Surgical History:  Procedure Laterality Date   ABDOMINOPLASTY/PANNICULECTOMY     CHOLECYSTECTOMY     DILITATION & CURRETTAGE/HYSTROSCOPY WITH NOVASURE ABLATION N/A 07/25/2013   Procedure: DILATATION & CURETTAGE/HYSTEROSCOPY WITH NOVASURE ABLATION;  Surgeon: Miguel Aschoff, MD;  Location: [facility];  Service: Gynecology;  Laterality: N/A;   TONSILLECTOMY     TOTAL HIP ARTHROPLASTY Right 03/24/2020   Procedure: RIGHT TOTAL HIP ARTHROPLASTY ANTERIOR APPROACH;  Surgeon: Marcene Corning, MD;  Location: [facility];  Service: Orthopedics;  Laterality: Right;   TUBAL LIGATION     WISDOM TOOTH EXTRACTION       Current Outpatient Medications:    albuterol (VENTOLIN HFA) 108 (90 Base) MCG/ACT inhaler, Inhale 2 puffs into the lungs every 6 (six) hours as needed for wheezing or shortness of breath., Disp:  6.7 g, Rfl: 0   Betamethasone Diprop-Minoxidil 0.05-7 % SOLN, 1 application every morning, Disp: 60 g, Rfl: 4   Betamethasone Diprop-Minoxidil 0.05-7 % SOLN, Apply 1 Application topically in the morning., Disp: 60 g, Rfl: 2   betamethasone dipropionate 0.05 % cream, APPLY TO THE HANDS (RASH) TWICE DAILY AS NEEDED FOR TWO WEEKS THEN STOP, Disp: 45 g, Rfl: 3   Boric Acid Vaginal (AZO BORIC ACID) 600 MG SUPP, Compounded Boric Acid 600mg  vaginal capsules  Boric Acid 600mg  vaginal capsules.  DO NOT TAKE ORALLY/TOXIC  insert 1 suppository weekly x 6 weeks; then use prn, Disp: , Rfl:    clobetasol cream (TEMOVATE) 0.05 %, Apply to affected area two times daily, Disp: 15 g, Rfl: 1   clotrimazole-betamethasone (LOTRISONE) cream, APPLY A THIN COAT DAILY AS NEEDED, Disp: 45 g, Rfl: 2   Crisaborole (EUCRISA) 2 % OINT, Use 1 (one) Application two times daily, Disp: 60 g, Rfl: 1   hydrOXYzine (VISTARIL) 25 MG capsule, Take 1 capsule (25 mg total) by mouth every 8 (eight) hours as needed., Disp: 30 capsule, Rfl: 1   latanoprost (XALATAN) 0.005 % ophthalmic solution, Place 1 drop into both eyes at bedtime, Disp: 7.5 mL, Rfl: 3   linaclotide (LINZESS) 145 MCG CAPS capsule, Take 1 capsule (145 mcg total) by mouth daily. Before the first meal of the day on and empty stomach., Disp: 30 capsule,  Rfl: 6   naloxone (NARCAN) nasal spray 4 mg/0.1 mL, 1 spray every 3 minutes; spray 1 dose into ONE nostril; alternate nostrils w each dose until help arrives, Disp: 2 each, Rfl: 1   oxyCODONE-acetaminophen (PERCOCET) 10-325 MG tablet, Take 1 tablet by mouth 3 (three) times daily as needed., Disp: 90 tablet, Rfl: 0   oxyCODONE-acetaminophen (PERCOCET) 10-325 MG tablet, Take 1 tablet by mouth 3 (three) times daily as needed., Disp: 90 tablet, Rfl: 0   oxyCODONE-acetaminophen (PERCOCET) 10-325 MG tablet, Take 1 tablet by mouth 3 (three) times daily as needed., Disp: 90 tablet, Rfl: 0   pantoprazole (PROTONIX) 40 MG tablet, Take 1  tablet (40 mg total) by mouth daily., Disp: 30 tablet, Rfl: 2   Vitamin D, Ergocalciferol, (DRISDOL) 1.25 MG (50000 UNIT) CAPS capsule, Take 1 capsule (50,000 Units total) by mouth once a week., Disp: 12 capsule, Rfl: 2   celecoxib (CELEBREX) 200 MG capsule, Take 1 capsule (200 mg total) by mouth daily with food as needed for pain., Disp: 90 capsule, Rfl: 1   phentermine (ADIPEX-P) 37.5 MG tablet, Take 1 tablet (37.5 mg) by mouth daily before breakfast., Disp: 30 tablet, Rfl: 2  Allergies  Allergen Reactions   Metronidazole Nausea And Vomiting   Penicillins Rash and Other (See Comments)    Has patient had a PCN reaction causing immediate rash, facial/tongue/throat swelling, SOB or lightheadedness with hypotension: Yes Has patient had a PCN reaction causing severe rash involving mucus membranes or skin necrosis: No Has patient had a PCN reaction that required hospitalization No Has patient had a PCN reaction occurring within the last 10 years: No If all of the above answers are "NO", then may proceed with Cephalosporin use.    ROS neg/noncontributory except as noted HPI/below      Objective:     BP 110/70   Pulse 75   Temp 98.3 F (36.8 C) (Temporal)   Resp 18   Ht 5\' 2"  (1.575 m)   Wt 232 lb 4 oz (105.3 kg)   SpO2 99%   BMI 42.48 kg/m  Wt Readings from Last 3 Encounters:  01/04/23 232 lb 4 oz (105.3 kg)  08/31/22 236 lb (107 kg)  06/01/22 239 lb 4 oz (108.5 kg)    Physical Exam   Gen: WDWN NAD HEENT: NCAT, conjunctiva not injected, sclera nonicteric NECK:  supple, no thyromegaly, no nodes, no carotid bruits CARDIAC: RRR, S1S2+, no murmur. DP 2+B LUNGS: CTAB. No wheezes ABDOMEN:  BS+, soft, NTND, No HSM, no masses EXT:  no edema MSK: no gross abnormalities.  NEURO: A&O x3.  CN II-XII intact.  PSYCH: normal mood. Good eye contact  PDMP reviewed today, no red flags       Assessment & Plan:  Insomnia, unspecified type  Morbid obesity with BMI of 40.0-44.9, adult  (HCC)  Primary osteoarthritis of right hip  Other orders -     Celecoxib; Take 1 capsule (200 mg total) by mouth daily with food as needed for pain.  Dispense: 90 capsule; Refill: 1 -     Phentermine HCl; Take 1 tablet (37.5 mg) by mouth daily before breakfast.  Dispense: 30 tablet; Refill: 2 -     hydrOXYzine Pamoate; Take 1 capsule (25 mg total) by mouth every 8 (eight) hours as needed.  Dispense: 30 capsule; Refill: 1  1.  Insomnia-intermittent.  Exacerbated by stressors in life.  Very anxious.  Will trial hydroxyzine 25 mg at at bedtime as needed but can use during  the day if overly anxious.  Does not want daily meds for moods at this stage. 2.  Obesity-patient has been trying to work on diet.  She has limited exercise due to chronic hip pain.  Has been on phentermine for a long time.  Weight has been very slow going.  However, she did lose 4 pounds in 4 months.  Insurance no longer paying for GLP-1.  We discussed that she cannot stay on phentermine long-term.  I am concerned however, if we stop it, she will regain what she has lost.  Advised to take a 2-week break from the phentermine and then restart.  Follow-up in 3 months 3.  Chronic osteoarthritis right hip-patient is on oxycodone from pain management.  Also she is on Celebrex 200 mg daily.  Trying to be active, but is very limited.  Return in about 3 months (around 04/05/2023) for wt.  Germaine Pomfret Rice,acting as a scribe for Angelena Sole, MD.,have documented all relevant documentation on the behalf of Angelena Sole, MD,as directed by  Angelena Sole, MD while in the presence of Angelena Sole, MD.  I, Angelena Sole, MD, have reviewed all documentation for this visit. The documentation on 01/04/23 for the exam, diagnosis, procedures, and orders are all accurate and complete.     Angelena Sole, MD

## 2023-01-05 ENCOUNTER — Other Ambulatory Visit (HOSPITAL_COMMUNITY): Payer: Self-pay

## 2023-01-05 MED ORDER — OXYCODONE-ACETAMINOPHEN 10-325 MG PO TABS
1.0000 | ORAL_TABLET | Freq: Three times a day (TID) | ORAL | 0 refills | Status: DC | PRN
Start: 2023-01-05 — End: 2023-06-14
  Filled 2023-01-05: qty 90, 30d supply, fill #0

## 2023-01-06 NOTE — Progress Notes (Signed)
Reviewed.  Last labs April-please abstract

## 2023-01-10 ENCOUNTER — Encounter: Payer: Self-pay | Admitting: Family Medicine

## 2023-01-20 ENCOUNTER — Other Ambulatory Visit (HOSPITAL_COMMUNITY): Payer: Self-pay

## 2023-01-23 ENCOUNTER — Telehealth: Payer: Self-pay | Admitting: Family Medicine

## 2023-01-23 NOTE — Telephone Encounter (Signed)
Please see message below and advise.

## 2023-01-23 NOTE — Telephone Encounter (Signed)
Patient called to let pcp know that the hydroxyzine she was prescribed is not helping with issue that patient spoke with PCP about. Pt is requesting any other recommendations. Please Advise.

## 2023-01-25 ENCOUNTER — Encounter: Payer: Self-pay | Admitting: *Deleted

## 2023-01-26 ENCOUNTER — Other Ambulatory Visit (HOSPITAL_COMMUNITY): Payer: Self-pay

## 2023-01-26 MED ORDER — OXYCODONE-ACETAMINOPHEN 10-325 MG PO TABS
1.0000 | ORAL_TABLET | Freq: Four times a day (QID) | ORAL | 0 refills | Status: DC | PRN
  Filled 2023-01-26: qty 120, 30d supply, fill #0

## 2023-01-26 MED ORDER — ONDANSETRON HCL 4 MG PO TABS
8.0000 mg | ORAL_TABLET | ORAL | 0 refills | Status: AC
  Filled 2023-01-26: qty 42, 7d supply, fill #0

## 2023-01-26 NOTE — Telephone Encounter (Signed)
Patient stated that she has tried taking 2, may sleep for 1-2 hours and then back up. Patient advised per Dr. Ruthine Dose, to take 3 and let us know how she does, if it doesn't work we may need to try something different, but have to be mindful of the medications she is on due to interactions. Patient verbalized understanding.

## 2023-01-27 ENCOUNTER — Other Ambulatory Visit (HOSPITAL_COMMUNITY): Payer: Self-pay

## 2023-02-08 ENCOUNTER — Other Ambulatory Visit (HOSPITAL_COMMUNITY): Payer: Self-pay

## 2023-02-28 ENCOUNTER — Other Ambulatory Visit (HOSPITAL_COMMUNITY): Payer: Self-pay

## 2023-02-28 MED ORDER — CLOTRIMAZOLE-BETAMETHASONE 1-0.05 % EX CREA
TOPICAL_CREAM | CUTANEOUS | 2 refills | Status: DC
  Filled 2023-02-28: qty 45, 30d supply, fill #0
  Filled 2023-06-03 – 2023-06-23 (×2): qty 45, 30d supply, fill #1
  Filled 2023-07-18: qty 45, 30d supply, fill #2

## 2023-02-28 MED ORDER — OXYCODONE-ACETAMINOPHEN 10-325 MG PO TABS
1.0000 | ORAL_TABLET | Freq: Four times a day (QID) | ORAL | 0 refills | Status: DC | PRN
  Filled 2023-02-28: qty 120, 30d supply, fill #0

## 2023-03-02 ENCOUNTER — Telehealth: Payer: Self-pay | Admitting: Family Medicine

## 2023-03-02 ENCOUNTER — Other Ambulatory Visit (HOSPITAL_COMMUNITY): Payer: Self-pay

## 2023-03-02 NOTE — Telephone Encounter (Signed)
Patient called for an update on PA for saxenda. Has this been received? If so, any updates?

## 2023-03-03 ENCOUNTER — Encounter: Payer: Self-pay | Admitting: *Deleted

## 2023-03-03 NOTE — Telephone Encounter (Signed)
Patient notified of message below. Patient stated that she wanted to try [facility]. Patient sent mychart message.

## 2023-03-06 ENCOUNTER — Other Ambulatory Visit (HOSPITAL_COMMUNITY): Payer: Self-pay

## 2023-03-07 ENCOUNTER — Other Ambulatory Visit (HOSPITAL_COMMUNITY): Payer: Self-pay

## 2023-03-07 ENCOUNTER — Other Ambulatory Visit: Payer: Self-pay | Admitting: Family Medicine

## 2023-03-07 MED ORDER — WEGOVY 0.25 MG/0.5ML ~~LOC~~ SOAJ
0.2500 mg | SUBCUTANEOUS | 0 refills | Status: DC
  Filled 2023-03-07: qty 2, 28d supply, fill #0

## 2023-03-08 ENCOUNTER — Ambulatory Visit: Payer: Medicare Other | Admitting: Dermatology

## 2023-03-09 ENCOUNTER — Other Ambulatory Visit (HOSPITAL_COMMUNITY): Payer: Self-pay

## 2023-03-10 ENCOUNTER — Telehealth: Payer: Self-pay

## 2023-03-10 ENCOUNTER — Other Ambulatory Visit (HOSPITAL_COMMUNITY): Payer: Self-pay

## 2023-03-10 NOTE — Telephone Encounter (Signed)
Pharmacy Patient Advocate Encounter   Received notification from Pt Calls Messages that prior authorization for Wegovy 0.25mg /0.96ml is required/requested.   Insurance verification completed.   The patient is insured through [facility] MEDICAID .   Per test claim: PA required; PA submitted to above mentioned insurance via Fax Key/confirmation #/EOC -- Status is pending   Fax# [phone]

## 2023-03-13 ENCOUNTER — Other Ambulatory Visit (HOSPITAL_COMMUNITY): Payer: Self-pay

## 2023-03-16 ENCOUNTER — Other Ambulatory Visit: Payer: Self-pay

## 2023-03-20 ENCOUNTER — Other Ambulatory Visit (HOSPITAL_COMMUNITY): Payer: Self-pay

## 2023-03-28 ENCOUNTER — Other Ambulatory Visit (HOSPITAL_COMMUNITY): Payer: Self-pay

## 2023-03-29 ENCOUNTER — Other Ambulatory Visit (HOSPITAL_COMMUNITY): Payer: Self-pay

## 2023-03-29 MED ORDER — OXYCODONE-ACETAMINOPHEN 10-325 MG PO TABS
1.0000 | ORAL_TABLET | Freq: Four times a day (QID) | ORAL | 0 refills | Status: DC | PRN
  Filled 2023-03-29: qty 120, 30d supply, fill #0

## 2023-03-29 MED ORDER — NALOXONE HCL 4 MG/0.1ML NA LIQD
NASAL | 1 refills | Status: DC
  Filled 2023-03-29: qty 2, 30d supply, fill #0

## 2023-03-31 ENCOUNTER — Other Ambulatory Visit (HOSPITAL_COMMUNITY): Payer: Self-pay

## 2023-04-03 ENCOUNTER — Other Ambulatory Visit (HOSPITAL_COMMUNITY): Payer: Self-pay

## 2023-04-05 ENCOUNTER — Other Ambulatory Visit: Payer: Self-pay | Admitting: Family Medicine

## 2023-04-05 ENCOUNTER — Encounter: Payer: Self-pay | Admitting: Family Medicine

## 2023-04-05 ENCOUNTER — Ambulatory Visit: Payer: Medicare Other | Admitting: Family Medicine

## 2023-04-05 ENCOUNTER — Other Ambulatory Visit (HOSPITAL_COMMUNITY): Payer: Self-pay

## 2023-04-05 VITALS — BP 130/80 | HR 88 | Temp 97.9°F | Resp 18 | Ht 62.0 in | Wt 252.2 lb

## 2023-04-05 DIAGNOSIS — Z6841 Body Mass Index (BMI) 40.0 and over, adult: Secondary | ICD-10-CM

## 2023-04-05 DIAGNOSIS — G4700 Insomnia, unspecified: Secondary | ICD-10-CM

## 2023-04-05 MED ORDER — PANTOPRAZOLE SODIUM 40 MG PO TBEC
40.0000 mg | DELAYED_RELEASE_TABLET | Freq: Every day | ORAL | 2 refills | Status: DC
  Filled 2023-04-05 – 2023-04-24 (×2): qty 30, 30d supply, fill #0
  Filled 2023-06-03: qty 30, 30d supply, fill #1

## 2023-04-05 MED ORDER — WEGOVY 0.5 MG/0.5ML ~~LOC~~ SOAJ
0.5000 mg | SUBCUTANEOUS | 0 refills | Status: DC
  Filled 2023-04-05 (×2): qty 2, 28d supply, fill #0

## 2023-04-05 MED ORDER — HYDROXYZINE HCL 10 MG PO TABS
10.0000 mg | ORAL_TABLET | Freq: Every evening | ORAL | 0 refills | Status: DC
  Filled 2023-04-05: qty 30, 30d supply, fill #0

## 2023-04-05 MED ORDER — PHENTERMINE HCL 37.5 MG PO TABS
37.5000 mg | ORAL_TABLET | Freq: Every day | ORAL | 2 refills | Status: DC
  Filled 2023-04-05: qty 30, 30d supply, fill #0
  Filled 2023-05-08: qty 30, 30d supply, fill #1
  Filled 2023-06-03: qty 30, 30d supply, fill #2

## 2023-04-05 NOTE — Assessment & Plan Note (Signed)
Chronic.  Not controlled on hydroxyzine 50mg  caps.  Pt states her brother's HCL works better.  Will try the 10mg  tabs.  May need psych.

## 2023-04-05 NOTE — Progress Notes (Signed)
Subjective:     Patient ID: Sandra Bird, female    DOB: 12-08-1970, 52 y.o.   MRN: [id number]  Chief Complaint  Patient presents with   Follow-up    Follow-up on weight, doesn't feel like [name] is working, increase to next dose Discuss better medication for sleep, hydroxyzine not working    HPI [employer] 0.25mg .  not working yet.  On for 1 month.  Exercise-limited by hips and knees.  Doing some walking and arms and legs weights.  Diet-oatmeal and banana, salad at lunch w/cheese, ham and bacon and ranch, spaghetti, chix, rice, beans.always hungry and eating a lot of sweets. No SE  Insomnia-hydoxyzine 50 mg not working.  Took brothers hydroxyzine hcl and worked better  [employer] derm-meds too expensive  Health Maintenance Due  Topic Date Due   Medicare Annual Wellness (AWV)  Never done   DTaP/Tdap/Td (1 - Tdap) Never done    Past Medical History:  Diagnosis Date   Arthritis    SVD (spontaneous vaginal delivery)    x 3    Past Surgical History:  Procedure Laterality Date   ABDOMINOPLASTY/PANNICULECTOMY     CHOLECYSTECTOMY     DILITATION & CURRETTAGE/HYSTROSCOPY WITH NOVASURE ABLATION N/A 07/25/2013   Procedure: DILATATION & CURETTAGE/HYSTEROSCOPY WITH NOVASURE ABLATION;  Surgeon: Miguel Aschoff, MD;  Location: [facility];  Service: Gynecology;  Laterality: N/A;   TONSILLECTOMY     TOTAL HIP ARTHROPLASTY Right 03/24/2020   Procedure: RIGHT TOTAL HIP ARTHROPLASTY ANTERIOR APPROACH;  Surgeon: Marcene Corning, MD;  Location: [facility];  Service: Orthopedics;  Laterality: Right;   TUBAL LIGATION     WISDOM TOOTH EXTRACTION       Current Outpatient Medications:    albuterol (VENTOLIN HFA) 108 (90 Base) MCG/ACT inhaler, Inhale 2 puffs into the lungs every 6 (six) hours as needed for wheezing or shortness of breath., Disp: 6.7 g, Rfl: 0   Betamethasone Diprop-Minoxidil 0.05-7 % SOLN, 1 application every morning, Disp: 60 g, Rfl: 4   Betamethasone Diprop-Minoxidil  0.05-7 % SOLN, Apply 1 Application topically in the morning., Disp: 60 g, Rfl: 2   betamethasone dipropionate 0.05 % cream, APPLY TO THE HANDS (RASH) TWICE DAILY AS NEEDED FOR TWO WEEKS THEN STOP, Disp: 45 g, Rfl: 3   Boric Acid Vaginal (AZO BORIC ACID) 600 MG SUPP, Compounded Boric Acid 600mg  vaginal capsules  Boric Acid 600mg  vaginal capsules.  DO NOT TAKE ORALLY/TOXIC  insert 1 suppository weekly x 6 weeks; then use prn, Disp: , Rfl:    celecoxib (CELEBREX) 200 MG capsule, Take 1 capsule (200 mg total) by mouth daily with food as needed for pain., Disp: 90 capsule, Rfl: 1   clobetasol cream (TEMOVATE) 0.05 %, Apply to affected area two times daily, Disp: 15 g, Rfl: 1   clotrimazole-betamethasone (LOTRISONE) cream, APPLY A THIN COAT DAILY AS NEEDED, Disp: 45 g, Rfl: 2   Crisaborole (EUCRISA) 2 % OINT, Use 1 (one) Application two times daily, Disp: 60 g, Rfl: 1   hydrOXYzine (ATARAX) 10 MG tablet, Take 1 tablet (10 mg total) by mouth at bedtime., Disp: 30 tablet, Rfl: 0   latanoprost (XALATAN) 0.005 % ophthalmic solution, Place 1 drop into both eyes at bedtime, Disp: 7.5 mL, Rfl: 3   linaclotide (LINZESS) 145 MCG CAPS capsule, Take 1 capsule (145 mcg total) by mouth daily. Before the first meal of the day on and empty stomach., Disp: 30 capsule, Rfl: 6   naloxone (NARCAN) nasal spray 4 mg/0.1 mL, Use 1  spray every 3 minutes. Spray 1 dose into ONE nostril; alternate nostrils w each dose until help arrives, Disp: 2 each, Rfl: 1   naloxone (NARCAN) nasal spray 4 mg/0.1 mL, 1 spray every 3 minutes; spray 1 dose into ONE nostril; alternate nostrils w each dose until help arrives, Disp: 2 each, Rfl: 1   ondansetron (ZOFRAN) 4 MG tablet, Take 2 tablets (8 mg total) by mouth 2 to 3 times per day as needed for nausea and vomiting, Disp: 42 tablet, Rfl: 0   oxyCODONE-acetaminophen (PERCOCET) 10-325 MG tablet, Take 1 tablet by mouth 3 (three) times daily as needed., Disp: 90 tablet, Rfl: 0    oxyCODONE-acetaminophen (PERCOCET) 10-325 MG tablet, Take 1 tablet by mouth 3 (three) times daily as needed., Disp: 90 tablet, Rfl: 0   oxyCODONE-acetaminophen (PERCOCET) 10-325 MG tablet, Take 1 tablet by mouth 3 (three) times daily as needed., Disp: 90 tablet, Rfl: 0   oxyCODONE-acetaminophen (PERCOCET) 10-325 MG tablet, Take 1 tablet by mouth 3 (three) times daily as needed., Disp: 90 tablet, Rfl: 0   oxyCODONE-acetaminophen (PERCOCET) 10-325 MG tablet, Take 1 tablet by mouth 4 (four) times daily as needed., Disp: 120 tablet, Rfl: 0   oxyCODONE-acetaminophen (PERCOCET) 10-325 MG tablet, Take 1 tablet by mouth 4 (four) times daily as needed., Disp: 120 tablet, Rfl: 0   oxyCODONE-acetaminophen (PERCOCET) 10-325 MG tablet, Take 1 tablet by mouth 4 (four) times daily as needed., Disp: 120 tablet, Rfl: 0   pantoprazole (PROTONIX) 40 MG tablet, Take 1 tablet (40 mg total) by mouth daily., Disp: 30 tablet, Rfl: 2   Semaglutide-Weight Management (WEGOVY) 0.5 MG/0.5ML SOAJ, Inject 0.5 mg into the skin once a week., Disp: 2 mL, Rfl: 0   Vitamin D, Ergocalciferol, (DRISDOL) 1.25 MG (50000 UNIT) CAPS capsule, Take 1 capsule (50,000 Units total) by mouth once a week., Disp: 12 capsule, Rfl: 2   phentermine (ADIPEX-P) 37.5 MG tablet, Take 1 tablet (37.5 mg) by mouth daily before breakfast., Disp: 30 tablet, Rfl: 2  Allergies  Allergen Reactions   Metronidazole Nausea And Vomiting   Penicillin G Rash   Penicillins Rash and Other (See Comments)    Has patient had a PCN reaction causing immediate rash, facial/tongue/throat swelling, SOB or lightheadedness with hypotension: Yes Has patient had a PCN reaction causing severe rash involving mucus membranes or skin necrosis: No Has patient had a PCN reaction that required hospitalization No Has patient had a PCN reaction occurring within the last 10 years: No If all of the above answers are "NO", then may proceed with Cephalosporin use.    ROS  neg/noncontributory except as noted HPI/below      Objective:     BP 130/80   Pulse 88   Temp 97.9 F (36.6 C) (Temporal)   Resp 18   Ht 5\' 2"  (1.575 m)   Wt 252 lb 4 oz (114.4 kg)   SpO2 98%   BMI 46.14 kg/m  Wt Readings from Last 3 Encounters:  04/05/23 252 lb 4 oz (114.4 kg)  01/04/23 232 lb 4 oz (105.3 kg)  08/31/22 236 lb (107 kg)    Physical Exam   Gen: WDWN NAD HEENT: NCAT, conjunctiva not injected, sclera nonicteric CARDIAC: RRR, S1S2+, no murmur. MSK: no gross abnormalities.  NEURO: A&O x3.  CN II-XII intact.  PSYCH: normal mood. Good eye contact     Assessment & Plan:  Morbid obesity with BMI of 45.0-49.9, adult (HCC)  Insomnia, unspecified type Assessment & Plan: Chronic.  Not controlled  on hydroxyzine 50mg  caps.  Pt states her brother's HCL works better.  Will try the 10mg  tabs.  May need psych.  Orders: -     hydrOXYzine HCl; Take 1 tablet (10 mg total) by mouth at bedtime.  Dispense: 30 tablet; Refill: 0  Other orders -     Phentermine HCl; Take 1 tablet (37.5 mg) by mouth daily before breakfast.  Dispense: 30 tablet; Refill: 2 -     Wegovy; Inject 0.5 mg into the skin once a week.  Dispense: 2 mL; Refill: 0   Obesity-has gained but just started meds.  Will increase wegovy to 0.5mg  weekly for 1 month, then message to the 1mg  then the 1.7mg .  continue phentermine 37.5mg  daily PDMP checked.  Discussed better dietary choices and exercise.    Return in about 3 months (around 07/04/2023) for wt.  Angelena Sole, MD

## 2023-04-05 NOTE — Patient Instructions (Signed)

## 2023-04-07 ENCOUNTER — Other Ambulatory Visit (HOSPITAL_COMMUNITY): Payer: Self-pay

## 2023-04-10 ENCOUNTER — Other Ambulatory Visit (HOSPITAL_COMMUNITY): Payer: Self-pay

## 2023-04-12 ENCOUNTER — Other Ambulatory Visit (HOSPITAL_COMMUNITY): Payer: Self-pay

## 2023-04-14 ENCOUNTER — Other Ambulatory Visit (HOSPITAL_COMMUNITY): Payer: Self-pay

## 2023-04-14 ENCOUNTER — Encounter: Payer: Self-pay | Admitting: Family Medicine

## 2023-04-14 ENCOUNTER — Ambulatory Visit (INDEPENDENT_AMBULATORY_CARE_PROVIDER_SITE_OTHER): Payer: Medicare Other | Admitting: Family Medicine

## 2023-04-14 VITALS — BP 126/85 | HR 101 | Temp 98.0°F | Resp 18 | Ht 62.0 in

## 2023-04-14 DIAGNOSIS — R1030 Lower abdominal pain, unspecified: Secondary | ICD-10-CM

## 2023-04-14 DIAGNOSIS — R1032 Left lower quadrant pain: Secondary | ICD-10-CM

## 2023-04-14 LAB — COMPREHENSIVE METABOLIC PANEL
ALT: 26 U/L (ref 0–35)
AST: 35 U/L (ref 0–37)
Albumin: 4.4 g/dL (ref 3.5–5.2)
Alkaline Phosphatase: 108 U/L (ref 39–117)
BUN: 19 mg/dL (ref 6–23)
CO2: 28 meq/L (ref 19–32)
Calcium: 9.4 mg/dL (ref 8.4–10.5)
Chloride: 99 meq/L (ref 96–112)
Creatinine, Ser: 0.94 mg/dL (ref 0.40–1.20)
GFR: 68.97 mL/min (ref >60.00)
Glucose, Bld: 86 mg/dL (ref 70–99)
Potassium: 3.8 meq/L (ref 3.5–5.1)
Sodium: 138 meq/L (ref 135–145)
Total Bilirubin: 0.8 mg/dL (ref 0.2–1.2)
Total Protein: 8.2 g/dL (ref 6.0–8.3)

## 2023-04-14 LAB — POC URINALSYSI DIPSTICK (AUTOMATED)
Bilirubin, UA: NEGATIVE
Glucose, UA: NEGATIVE
Ketones, UA: NEGATIVE
Nitrite, UA: NEGATIVE
Protein, UA: NEGATIVE
Spec Grav, UA: 1.015 (ref 1.010–1.025)
Urobilinogen, UA: 0.2 U/dL
pH, UA: 6 (ref 5.0–8.0)

## 2023-04-14 LAB — CBC WITH DIFFERENTIAL/PLATELET
Basophils Absolute: 0 10*3/uL (ref 0.0–0.1)
Basophils Relative: 0.5 % (ref 0.0–3.0)
Eosinophils Absolute: 0.1 10*3/uL (ref 0.0–0.7)
Eosinophils Relative: 1.9 % (ref 0.0–5.0)
HCT: 42.6 % (ref 36.0–46.0)
Hemoglobin: 13.7 g/dL (ref 12.0–15.0)
Lymphocytes Relative: 31.8 % (ref 12.0–46.0)
Lymphs Abs: 2.2 10*3/uL (ref 0.7–4.0)
MCHC: 32.3 g/dL (ref 30.0–36.0)
MCV: 85.6 fL (ref 78.0–100.0)
Monocytes Absolute: 0.4 10*3/uL (ref 0.1–1.0)
Monocytes Relative: 5.8 % (ref 3.0–12.0)
Neutro Abs: 4.1 10*3/uL (ref 1.4–7.7)
Neutrophils Relative %: 60 % (ref 43.0–77.0)
Platelets: 338 10*3/uL (ref 150.0–400.0)
RBC: 4.97 Mil/uL (ref 3.87–5.11)
RDW: 15.1 % (ref 11.5–15.5)
WBC: 6.8 10*3/uL (ref 4.0–10.5)

## 2023-04-14 NOTE — Patient Instructions (Signed)
It was very nice to see you today!  Happy holidays  Can take ibuprofen if needed for pain.   Ordered CT scan    PLEASE NOTE:  If you had any lab tests please let us know if you have not heard back within a few days. You may see your results on MyChart before we have a chance to review them but we will give you a call once they are reviewed by [place]. If we ordered any referrals today, please let us know if you have not heard from their office within the next week.   Please try these tips to maintain a healthy lifestyle:  Eat most of your calories during the day when you are active. Eliminate processed foods including packaged sweets (pies, cakes, cookies), reduce intake of potatoes, white bread, white pasta, and white rice. Look for whole grain options, oat flour or almond flour.  Each meal should contain half fruits/vegetables, one quarter protein, and one quarter carbs (no bigger than a computer mouse).  Cut down on sweet beverages. This includes juice, soda, and sweet tea. Also watch fruit intake, though this is a healthier sweet option, it still contains natural sugar! Limit to 3 servings daily.  Drink at least 1 glass of water with each meal and aim for at least 8 glasses per day  Exercise at least 150 minutes every week.

## 2023-04-14 NOTE — Progress Notes (Signed)
Subjective:     Patient ID: Sandra Bird, female    DOB: 04/25/1971, 52 y.o.   MRN: [id number]  Chief Complaint  Patient presents with   Abdominal Pain    Left sided abdominal pain, possible hernia, still having pain    HPI L sided abd pain for few months.  Intermitt.  Had tummy tuck 1 yr ago. Feels "knot" at times.  When standing, hurts and feels like "knot".  At times, has to walk bent over.  Was concerned about cyst on ovaries, saw gyn.  Has u/s on 12/30 but told sounds more like hernia.  Sharp pain and "flutters".  No bulge.  Bloated at times. Can be sitting.  Constant but changes in intensity. Compression helps.  No v/d/c.  No f/c. Stools more "slender" past few days.  Colon 2022. No dysuria.  No ua at gyn   pain can go to [facility] but o/w no rad. No recent spine films.    Health Maintenance Due  Topic Date Due   Medicare Annual Wellness (AWV)  Never done    Past Medical History:  Diagnosis Date   Arthritis    SVD (spontaneous vaginal delivery)    x 3    Past Surgical History:  Procedure Laterality Date   ABDOMINOPLASTY/PANNICULECTOMY     CHOLECYSTECTOMY     DILITATION & CURRETTAGE/HYSTROSCOPY WITH NOVASURE ABLATION N/A 07/25/2013   Procedure: DILATATION & CURETTAGE/HYSTEROSCOPY WITH NOVASURE ABLATION;  Surgeon: Miguel Aschoff, MD;  Location: [facility];  Service: Gynecology;  Laterality: N/A;   TONSILLECTOMY     TOTAL HIP ARTHROPLASTY Right 03/24/2020   Procedure: RIGHT TOTAL HIP ARTHROPLASTY ANTERIOR APPROACH;  Surgeon: Marcene Corning, MD;  Location: [facility];  Service: Orthopedics;  Laterality: Right;   TUBAL LIGATION     WISDOM TOOTH EXTRACTION       Current Outpatient Medications:    albuterol (VENTOLIN HFA) 108 (90 Base) MCG/ACT inhaler, Inhale 2 puffs into the lungs every 6 (six) hours as needed for wheezing or shortness of breath., Disp: 6.7 g, Rfl: 0   Betamethasone Diprop-Minoxidil 0.05-7 % SOLN, 1 application every morning, Disp: 60 g, Rfl: 4   Betamethasone  Diprop-Minoxidil 0.05-7 % SOLN, Apply 1 Application topically in the morning., Disp: 60 g, Rfl: 2   betamethasone dipropionate 0.05 % cream, APPLY TO THE HANDS (RASH) TWICE DAILY AS NEEDED FOR TWO WEEKS THEN STOP, Disp: 45 g, Rfl: 3   Boric Acid Vaginal (AZO BORIC ACID) 600 MG SUPP, Compounded Boric Acid 600mg  vaginal capsules  Boric Acid 600mg  vaginal capsules.  DO NOT TAKE ORALLY/TOXIC  insert 1 suppository weekly x 6 weeks; then use prn, Disp: , Rfl:    celecoxib (CELEBREX) 200 MG capsule, Take 1 capsule (200 mg total) by mouth daily with food as needed for pain., Disp: 90 capsule, Rfl: 1   clobetasol cream (TEMOVATE) 0.05 %, Apply to affected area two times daily, Disp: 15 g, Rfl: 1   clotrimazole-betamethasone (LOTRISONE) cream, APPLY A THIN COAT DAILY AS NEEDED, Disp: 45 g, Rfl: 2   Crisaborole (EUCRISA) 2 % OINT, Use 1 (one) Application two times daily, Disp: 60 g, Rfl: 1   hydrOXYzine (ATARAX) 10 MG tablet, Take 1 tablet (10 mg total) by mouth at bedtime., Disp: 30 tablet, Rfl: 0   latanoprost (XALATAN) 0.005 % ophthalmic solution, Place 1 drop into both eyes at bedtime, Disp: 7.5 mL, Rfl: 3   linaclotide (LINZESS) 145 MCG CAPS capsule, Take 1 capsule (145 mcg total) by mouth daily.  Before the first meal of the day on and empty stomach., Disp: 30 capsule, Rfl: 6   naloxone (NARCAN) nasal spray 4 mg/0.1 mL, Use 1 spray every 3 minutes. Spray 1 dose into ONE nostril; alternate nostrils w each dose until help arrives, Disp: 2 each, Rfl: 1   naloxone (NARCAN) nasal spray 4 mg/0.1 mL, 1 spray every 3 minutes; spray 1 dose into ONE nostril; alternate nostrils w each dose until help arrives, Disp: 2 each, Rfl: 1   ondansetron (ZOFRAN) 4 MG tablet, Take 2 tablets (8 mg total) by mouth 2 to 3 times per day as needed for nausea and vomiting, Disp: 42 tablet, Rfl: 0   oxyCODONE-acetaminophen (PERCOCET) 10-325 MG tablet, Take 1 tablet by mouth 3 (three) times daily as needed., Disp: 90 tablet, Rfl: 0    oxyCODONE-acetaminophen (PERCOCET) 10-325 MG tablet, Take 1 tablet by mouth 3 (three) times daily as needed., Disp: 90 tablet, Rfl: 0   oxyCODONE-acetaminophen (PERCOCET) 10-325 MG tablet, Take 1 tablet by mouth 3 (three) times daily as needed., Disp: 90 tablet, Rfl: 0   oxyCODONE-acetaminophen (PERCOCET) 10-325 MG tablet, Take 1 tablet by mouth 3 (three) times daily as needed., Disp: 90 tablet, Rfl: 0   oxyCODONE-acetaminophen (PERCOCET) 10-325 MG tablet, Take 1 tablet by mouth 4 (four) times daily as needed., Disp: 120 tablet, Rfl: 0   oxyCODONE-acetaminophen (PERCOCET) 10-325 MG tablet, Take 1 tablet by mouth 4 (four) times daily as needed., Disp: 120 tablet, Rfl: 0   oxyCODONE-acetaminophen (PERCOCET) 10-325 MG tablet, Take 1 tablet by mouth 4 (four) times daily as needed., Disp: 120 tablet, Rfl: 0   pantoprazole (PROTONIX) 40 MG tablet, Take 1 tablet (40 mg total) by mouth daily., Disp: 30 tablet, Rfl: 2   phentermine (ADIPEX-P) 37.5 MG tablet, Take 1 tablet (37.5 mg) by mouth daily before breakfast., Disp: 30 tablet, Rfl: 2   Semaglutide-Weight Management (WEGOVY) 0.5 MG/0.5ML SOAJ, Inject 0.5 mg into the skin once a week., Disp: 2 mL, Rfl: 0   Vitamin D, Ergocalciferol, (DRISDOL) 1.25 MG (50000 UNIT) CAPS capsule, Take 1 capsule (50,000 Units total) by mouth once a week., Disp: 12 capsule, Rfl: 2  Allergies  Allergen Reactions   Metronidazole Nausea And Vomiting   Penicillin G Rash   Penicillins Rash and Other (See Comments)    Has patient had a PCN reaction causing immediate rash, facial/tongue/throat swelling, SOB or lightheadedness with hypotension: Yes Has patient had a PCN reaction causing severe rash involving mucus membranes or skin necrosis: No Has patient had a PCN reaction that required hospitalization No Has patient had a PCN reaction occurring within the last 10 years: No If all of the above answers are "NO", then may proceed with Cephalosporin use.    ROS  neg/noncontributory except as noted HPI/below      Objective:     BP 126/85   Pulse (!) 101   Temp 98 F (36.7 C) (Temporal)   Resp 18   Ht 5\' 2"  (1.575 m)   SpO2 99%   BMI 46.14 kg/m  Wt Readings from Last 3 Encounters:  04/05/23 252 lb 4 oz (114.4 kg)  01/04/23 232 lb 4 oz (105.3 kg)  08/31/22 236 lb (107 kg)    Physical Exam   Gen: WDWN NAD HEENT: NCAT, conjunctiva not injected, sclera nonicteric ABDOMEN:  BS+, soft, mod tender LLQ and suprapubic. No HSM, no masses. No inq nodes.   EXT:  no edema MSK: no gross abnormalities. SLR neg B,  some L  hip pain in groin w/motion NEURO: A&O x3.  CN II-XII intact.  PSYCH: normal mood. Good eye contact   Results for orders placed or performed in visit on 04/14/23  POCT Urinalysis Dipstick (Automated)   Collection Time: 04/14/23 11:30 AM  Result Value Ref Range   Color, UA YELLOW    Clarity, UA CLEAR    Glucose, UA Negative Negative   Bilirubin, UA NEG    Ketones, UA NEG    Spec Grav, UA 1.015 1.010 - 1.025   Blood, UA 3+    pH, UA 6.0 5.0 - 8.0   Protein, UA Negative Negative   Urobilinogen, UA 0.2 0.2 or 1.0 E.U./dL   Nitrite, UA NEG    Leukocytes, UA Small (1+) (A) Negative       Assessment & Plan:  LLQ pain -     CBC with Differential/Platelet -     Comprehensive metabolic panel -     POCT Urinalysis Dipstick (Automated)  Lower abdominal pain -     CT ABDOMEN PELVIS W CONTRAST; Future   Lower abd pain-?mass,cyst,hernia,tics,other.  Check labs and CT  worse, n/v, to ER.  Possibly ortho if other etiol r/o Microscopic hematuria.  Check cx.    Return if symptoms worsen or fail to improve.  Angelena Sole, MD

## 2023-04-15 LAB — URINE CULTURE
MICRO NUMBER:: 15877065
Result:: NO GROWTH
SPECIMEN QUALITY:: ADEQUATE

## 2023-04-15 NOTE — Progress Notes (Signed)
Labs normal.  Urine had some blood-did she have some vaginal bleeding recently?

## 2023-04-16 ENCOUNTER — Encounter: Payer: Self-pay | Admitting: Family Medicine

## 2023-04-16 NOTE — Progress Notes (Signed)
No uti-did she have a period or spotting recently?

## 2023-04-17 ENCOUNTER — Encounter: Payer: Self-pay | Admitting: Family Medicine

## 2023-04-17 ENCOUNTER — Other Ambulatory Visit (HOSPITAL_COMMUNITY): Payer: Self-pay

## 2023-04-21 ENCOUNTER — Other Ambulatory Visit (HOSPITAL_COMMUNITY): Payer: Self-pay

## 2023-04-24 ENCOUNTER — Ambulatory Visit
Admission: RE | Admit: 2023-04-24 | Discharge: 2023-04-24 | Disposition: A | Discharge status: Home / Self Care | Payer: Medicare Other | Source: Ambulatory Visit | Attending: Family Medicine | Admitting: Family Medicine

## 2023-04-24 ENCOUNTER — Other Ambulatory Visit: Payer: Self-pay | Admitting: Family Medicine

## 2023-04-24 ENCOUNTER — Other Ambulatory Visit (HOSPITAL_COMMUNITY): Payer: Self-pay

## 2023-04-24 DIAGNOSIS — R1032 Left lower quadrant pain: Secondary | ICD-10-CM

## 2023-04-24 DIAGNOSIS — R1030 Lower abdominal pain, unspecified: Secondary | ICD-10-CM

## 2023-04-24 MED ORDER — IOPAMIDOL (ISOVUE-300) INJECTION 61%: 100.0000 mL | Freq: Once | INTRAVENOUS | Status: DC | PRN

## 2023-04-25 ENCOUNTER — Other Ambulatory Visit (HOSPITAL_COMMUNITY): Payer: Self-pay

## 2023-04-27 ENCOUNTER — Other Ambulatory Visit (HOSPITAL_COMMUNITY): Payer: Self-pay

## 2023-04-28 ENCOUNTER — Other Ambulatory Visit (HOSPITAL_COMMUNITY): Payer: Self-pay

## 2023-05-02 ENCOUNTER — Other Ambulatory Visit (HOSPITAL_COMMUNITY): Payer: Self-pay

## 2023-05-02 ENCOUNTER — Other Ambulatory Visit: Payer: Self-pay

## 2023-05-02 MED ORDER — OXYCODONE-ACETAMINOPHEN 10-325 MG PO TABS
1.0000 | ORAL_TABLET | Freq: Four times a day (QID) | ORAL | 0 refills | Status: DC | PRN
  Filled 2023-05-02: qty 120, 30d supply, fill #0

## 2023-05-02 MED ORDER — NALOXONE HCL 4 MG/0.1ML NA LIQD
NASAL | 1 refills | Status: AC
  Filled 2023-05-02: qty 2, 2d supply, fill #0

## 2023-05-02 MED ORDER — CLOBETASOL PROPIONATE 0.05 % EX CREA
TOPICAL_CREAM | Freq: Two times a day (BID) | CUTANEOUS | 1 refills | Status: DC
  Filled 2023-05-02: qty 60, 30d supply, fill #0
  Filled 2023-06-03: qty 60, 30d supply, fill #1

## 2023-05-03 ENCOUNTER — Other Ambulatory Visit (HOSPITAL_COMMUNITY): Payer: Self-pay

## 2023-05-06 ENCOUNTER — Other Ambulatory Visit: Payer: Self-pay | Admitting: Family Medicine

## 2023-05-06 MED ORDER — ZEPBOUND 2.5 MG/0.5ML ~~LOC~~ SOAJ
2.5000 mg | SUBCUTANEOUS | 0 refills | Status: DC
  Filled 2023-05-06 – 2023-06-03 (×2): qty 2, 28d supply, fill #0

## 2023-05-08 ENCOUNTER — Other Ambulatory Visit (HOSPITAL_BASED_OUTPATIENT_CLINIC_OR_DEPARTMENT_OTHER): Payer: Self-pay

## 2023-05-08 ENCOUNTER — Other Ambulatory Visit: Payer: Self-pay

## 2023-05-09 ENCOUNTER — Other Ambulatory Visit (HOSPITAL_COMMUNITY): Payer: Self-pay

## 2023-05-11 LAB — HM MAMMOGRAPHY

## 2023-05-19 ENCOUNTER — Other Ambulatory Visit: Payer: Self-pay

## 2023-05-20 ENCOUNTER — Other Ambulatory Visit (HOSPITAL_COMMUNITY): Payer: Self-pay

## 2023-05-23 ENCOUNTER — Other Ambulatory Visit (HOSPITAL_COMMUNITY): Payer: Self-pay

## 2023-05-25 ENCOUNTER — Encounter: Payer: Self-pay | Admitting: *Deleted

## 2023-05-25 ENCOUNTER — Telehealth: Payer: Self-pay | Admitting: Pharmacist

## 2023-05-25 ENCOUNTER — Other Ambulatory Visit (HOSPITAL_COMMUNITY): Payer: Self-pay

## 2023-05-25 NOTE — Telephone Encounter (Signed)
Pharmacy Patient Advocate Encounter   Received notification from Patient Pharmacy that prior authorization for Zepbound 2.5MG /0.5ML pen-injectors is required/requested.   Insurance verification completed.   The patient is insured through [employer] .   Per test claim: PA required; PA submitted to above mentioned insurance via CoverMyMeds Key/confirmation #/EOC [id number] Status is pending

## 2023-05-25 NOTE — Telephone Encounter (Signed)
Pharmacy Patient Advocate Encounter  Received notification from [facility] that Prior Authorization for Zepbound 2.5MG /0.5ML pen-injectors has been DENIED.  See denial reason below. No denial letter attached in CMM. Will attach denial letter to Media tab once received.   PA #/Case ID/Reference #: [id number]

## 2023-05-25 NOTE — Telephone Encounter (Signed)
Patient notified of message below.

## 2023-05-30 ENCOUNTER — Other Ambulatory Visit (HOSPITAL_COMMUNITY): Payer: Self-pay

## 2023-05-30 MED ORDER — OXYCODONE-ACETAMINOPHEN 10-325 MG PO TABS
ORAL_TABLET | ORAL | 0 refills | Status: AC
  Filled 2023-05-30: qty 120, 30d supply, fill #0

## 2023-06-03 ENCOUNTER — Other Ambulatory Visit (HOSPITAL_COMMUNITY): Payer: Self-pay

## 2023-06-04 ENCOUNTER — Other Ambulatory Visit: Payer: Self-pay

## 2023-06-04 ENCOUNTER — Other Ambulatory Visit (HOSPITAL_COMMUNITY): Payer: Self-pay

## 2023-06-05 ENCOUNTER — Other Ambulatory Visit: Payer: Self-pay

## 2023-06-07 ENCOUNTER — Other Ambulatory Visit (HOSPITAL_COMMUNITY): Payer: Self-pay

## 2023-06-07 ENCOUNTER — Ambulatory Visit: Payer: Medicare PPO | Admitting: Family Medicine

## 2023-06-07 ENCOUNTER — Encounter: Payer: Self-pay | Admitting: Family Medicine

## 2023-06-07 VITALS — BP 148/90 | HR 77 | Temp 98.2°F | Resp 18 | Ht 62.0 in | Wt 256.5 lb

## 2023-06-07 DIAGNOSIS — R519 Headache, unspecified: Secondary | ICD-10-CM

## 2023-06-07 DIAGNOSIS — K5904 Chronic idiopathic constipation: Secondary | ICD-10-CM

## 2023-06-07 DIAGNOSIS — R030 Elevated blood-pressure reading, without diagnosis of hypertension: Secondary | ICD-10-CM | POA: Diagnosis not present

## 2023-06-07 DIAGNOSIS — F5101 Primary insomnia: Secondary | ICD-10-CM | POA: Diagnosis not present

## 2023-06-07 DIAGNOSIS — N951 Menopausal and female climacteric states: Secondary | ICD-10-CM

## 2023-06-07 MED ORDER — MIRTAZAPINE 15 MG PO TABS
15.0000 mg | ORAL_TABLET | Freq: Every day | ORAL | 0 refills | Status: DC
  Filled 2023-06-07: qty 30, 30d supply, fill #0

## 2023-06-07 MED ORDER — LINACLOTIDE 145 MCG PO CAPS
145.0000 ug | ORAL_CAPSULE | Freq: Every day | ORAL | 6 refills | Status: DC
  Filled 2023-06-07: qty 30, 30d supply, fill #0
  Filled 2023-07-18 – 2023-08-28 (×2): qty 30, 30d supply, fill #1
  Filled 2023-10-10: qty 30, 30d supply, fill #2
  Filled 2023-12-15: qty 30, 30d supply, fill #3
  Filled 2024-02-12: qty 30, 30d supply, fill #4
  Filled 2024-04-01: qty 30, 30d supply, fill #5
  Filled 2024-05-03: qty 30, 30d supply, fill #6

## 2023-06-07 NOTE — Progress Notes (Signed)
Subjective:     Patient ID: Sandra Bird, female    DOB: 1970-04-29, 53 y.o.   MRN: [id number]  Chief Complaint  Patient presents with   Menopause    Discuss menopause, having flashes during the day and at night Night sweats, mind is foggy   Headache    Pounding headache x 3 days with no relief     HPI Discussed the use of AI scribe software for clinical note transcription with the patient, who gave verbal consent to proceed.  History of Present Illness   Sandra Bird is a 53 year old female who presents with constipation and menopausal symptoms.  She has a history of constipation, which has worsened due to oxycodone use. Linzess 145 mg daily was previously effective but was discontinued when bowel movements normalized. Constipation has returned, and she requests a refill. She has been using Miralax and stool softeners with partial relief. Current medications include pantoprazole, Celebrex, and hydroxyzine, which she finds ineffective for sleep.  Menopausal symptoms include hot flashes, night sweats, and decreased libido, which have intensified over the past month. Night sweats are severe, requiring changing clothes and showering. Additional symptoms include headaches, facial flushing, and increased blood pressure. She underwent an ablation six to seven years ago and has not had a period since. She has a history of cervical lesions, monitored annually by Dr. Eustace Moore about 1 month ago  Headaches have been intermittent for about a month, with the last three days being particularly persistent and unresponsive to medication. No history of migraines, but she notes blurry vision, attributed to her glasses. No slurred speech or limb weakness.  Blood pressure has been elevated at 148/90 mmHg, which is new. No prior history of hypertension. This may be related to menopause, weight gain, or medication use.  She has difficulty sleeping, possibly related to menopausal symptoms.  Hydroxyzine has been ineffective for sleep.  She reports weight gain despite using phentermine and making lifestyle modifications such as healthy eating and walking. Her insurance does not cover Zepbound. She has a history of using Saxenda, which helped lower her cholesterol when she was losing weight.       Health Maintenance Due  Topic Date Due   Medicare Annual Wellness (AWV)  Never done    Past Medical History:  Diagnosis Date   Arthritis    SVD (spontaneous vaginal delivery)    x 3    Past Surgical History:  Procedure Laterality Date   ABDOMINOPLASTY/PANNICULECTOMY     CHOLECYSTECTOMY     DILITATION & CURRETTAGE/HYSTROSCOPY WITH NOVASURE ABLATION N/A 07/25/2013   Procedure: DILATATION & CURETTAGE/HYSTEROSCOPY WITH NOVASURE ABLATION;  Surgeon: Miguel Aschoff, MD;  Location: [facility];  Service: Gynecology;  Laterality: N/A;   TONSILLECTOMY     TOTAL HIP ARTHROPLASTY Right 03/24/2020   Procedure: RIGHT TOTAL HIP ARTHROPLASTY ANTERIOR APPROACH;  Surgeon: Marcene Corning, MD;  Location: [facility];  Service: Orthopedics;  Laterality: Right;   TUBAL LIGATION     WISDOM TOOTH EXTRACTION       Current Outpatient Medications:    albuterol (VENTOLIN HFA) 108 (90 Base) MCG/ACT inhaler, Inhale 2 puffs into the lungs every 6 (six) hours as needed for wheezing or shortness of breath., Disp: 6.7 g, Rfl: 0   Betamethasone Diprop-Minoxidil 0.05-7 % SOLN, 1 application every morning, Disp: 60 g, Rfl: 4   Betamethasone Diprop-Minoxidil 0.05-7 % SOLN, Apply 1 Application topically in the morning., Disp: 60 g, Rfl: 2   betamethasone dipropionate 0.05 % cream,  APPLY TO THE HANDS (RASH) TWICE DAILY AS NEEDED FOR TWO WEEKS THEN STOP, Disp: 45 g, Rfl: 3   Boric Acid Vaginal (AZO BORIC ACID) 600 MG SUPP, Compounded Boric Acid 600mg  vaginal capsules  Boric Acid 600mg  vaginal capsules.  DO NOT TAKE ORALLY/TOXIC  insert 1 suppository weekly x 6 weeks; then use prn, Disp: , Rfl:    celecoxib (CELEBREX) 200 MG  capsule, Take 1 capsule (200 mg total) by mouth daily with food as needed for pain., Disp: 90 capsule, Rfl: 1   clobetasol cream (TEMOVATE) 0.05 %, Apply topically 2 (two) times daily. Apply to affected areas of hands twice daily x 2 weeks., Disp: 60 g, Rfl: 1   clotrimazole-betamethasone (LOTRISONE) cream, APPLY A THIN COAT DAILY AS NEEDED, Disp: 45 g, Rfl: 2   Crisaborole (EUCRISA) 2 % OINT, Use 1 (one) Application two times daily, Disp: 60 g, Rfl: 1   hydrOXYzine (ATARAX) 10 MG tablet, Take 1 tablet (10 mg total) by mouth at bedtime., Disp: 30 tablet, Rfl: 0   latanoprost (XALATAN) 0.005 % ophthalmic solution, Place 1 drop into both eyes at bedtime, Disp: 7.5 mL, Rfl: 3   mirtazapine (REMERON) 15 MG tablet, Take 1 tablet (15 mg total) by mouth at bedtime., Disp: 30 tablet, Rfl: 0   naloxone (NARCAN) nasal spray 4 mg/0.1 mL, Use 1 spray every 3 minutes. Spray 1 dose into ONE nostril; alternate nostrils w each dose until help arrives, Disp: 2 each, Rfl: 1   naloxone (NARCAN) nasal spray 4 mg/0.1 mL, 1 spray every 3 minutes; spray 1 dose into ONE nostril; alternate nostrils w each dose until help arrives, Disp: 2 each, Rfl: 1   naloxone (NARCAN) nasal spray 4 mg/0.1 mL, 1 spray every 3 minutes; spray 1 dose into ONE nostril; alternate nostrils with each dose until help arrives, Disp: 2 each, Rfl: 1   ondansetron (ZOFRAN) 4 MG tablet, Take 2 tablets (8 mg total) by mouth 2 to 3 times per day as needed for nausea and vomiting, Disp: 42 tablet, Rfl: 0   oxyCODONE-acetaminophen (PERCOCET) 10-325 MG tablet, Take 1 tablet by mouth 3 (three) times daily as needed., Disp: 90 tablet, Rfl: 0   oxyCODONE-acetaminophen (PERCOCET) 10-325 MG tablet, Take 1 tablet by mouth 3 (three) times daily as needed., Disp: 90 tablet, Rfl: 0   oxyCODONE-acetaminophen (PERCOCET) 10-325 MG tablet, Take 1 tablet by mouth 3 (three) times daily as needed., Disp: 90 tablet, Rfl: 0   oxyCODONE-acetaminophen (PERCOCET) 10-325 MG tablet,  Take 1 tablet by mouth 3 (three) times daily as needed., Disp: 90 tablet, Rfl: 0   oxyCODONE-acetaminophen (PERCOCET) 10-325 MG tablet, Take 1 tablet by mouth 4 (four) times daily as needed., Disp: 120 tablet, Rfl: 0   oxyCODONE-acetaminophen (PERCOCET) 10-325 MG tablet, Take 1 tablet by mouth 4 (four) times daily as needed., Disp: 120 tablet, Rfl: 0   oxyCODONE-acetaminophen (PERCOCET) 10-325 MG tablet, Take 1 tablet by mouth 4 (four) times daily as needed., Disp: 120 tablet, Rfl: 0   oxyCODONE-acetaminophen (PERCOCET) 10-325 MG tablet, Take 1 (one) tablet by mouth four times daily, as needed, Disp: 120 tablet, Rfl: 0   pantoprazole (PROTONIX) 40 MG tablet, Take 1 tablet (40 mg total) by mouth daily., Disp: 30 tablet, Rfl: 2   phentermine (ADIPEX-P) 37.5 MG tablet, Take 1 tablet (37.5 mg) by mouth daily before breakfast., Disp: 30 tablet, Rfl: 2   Vitamin D, Ergocalciferol, (DRISDOL) 1.25 MG (50000 UNIT) CAPS capsule, Take 1 capsule (50,000 Units total) by mouth  once a week., Disp: 12 capsule, Rfl: 2   linaclotide (LINZESS) 145 MCG CAPS capsule, Take 1 capsule (145 mcg total) by mouth daily. Before the first meal of the day on an empty stomach., Disp: 30 capsule, Rfl: 6  Allergies  Allergen Reactions   Metronidazole Nausea And Vomiting    Other Reaction(s): vomiting  metronidazole   Penicillin G Rash   Penicillins Other (See Comments) and Rash    Has patient had a PCN reaction causing immediate rash, facial/tongue/throat swelling, SOB or lightheadedness with hypotension: Yes  Has patient had a PCN reaction causing severe rash involving mucus membranes or skin necrosis: No  Has patient had a PCN reaction that required hospitalization No  Has patient had a PCN reaction occurring within the last 10 years: No  If all of the above answers are "NO", then may proceed with Cephalosporin use.   ROS neg/noncontributory except as noted HPI/below      Objective:     BP (!) 148/90 (BP  Location: Left Arm, Patient Position: Sitting, Cuff Size: Large)   Pulse 77   Temp 98.2 F (36.8 C) (Temporal)   Resp 18   Ht 5\' 2"  (1.575 m)   Wt 256 lb 8 oz (116.3 kg)   SpO2 99%   BMI 46.91 kg/m  Wt Readings from Last 3 Encounters:  06/07/23 256 lb 8 oz (116.3 kg)  04/05/23 252 lb 4 oz (114.4 kg)  01/04/23 232 lb 4 oz (105.3 kg)    Physical Exam   Gen: WDWN NAD HEENT: NCAT, conjunctiva not injected, sclera nonicteric.  eomi NECK:  supple, no thyromegaly, no nodes, no carotid bruits CARDIAC: RRR, S1S2+, no murmur. DP 2+B LUNGS: CTAB. No wheezes ABDOMEN:  BS+, soft, NTND, No HSM, no masses EXT:  no edema MSK: no gross abnormalities.  NEURO: A&O x3.  CN II-XII intact.  PSYCH: normal mood. Good eye contact     Assessment & Plan:  Chronic idiopathic constipation -     linaCLOtide; Take 1 capsule (145 mcg total) by mouth daily. Before the first meal of the day on an empty stomach.  Dispense: 30 capsule; Refill: 6  Primary insomnia -     Mirtazapine; Take 1 tablet (15 mg total) by mouth at bedtime.  Dispense: 30 tablet; Refill: 0  Hot flashes due to menopause  Elevated blood pressure reading  Acute nonintractable headache, unspecified headache type    Return in about 1 week (around 06/14/2023) for bp, flashes.  Angelena Sole, MD

## 2023-06-07 NOTE — Patient Instructions (Addendum)
Call Dr. Huntley Dec about the wegovy/zepbound compounds  Watch blood pressures  Start mirtazipine for sleep  No phentermine  Celebrex 2x/day

## 2023-06-08 NOTE — Telephone Encounter (Signed)
Copied from CRM [id number]. Topic: Clinical - Request for Lab/Test Order >> Jun 08, 2023  1:16 PM Tiffany H wrote: Red Word: Extreme Pain - 10.   Patient originally called to schedule lab visit. Upon further questioning, patient advised that she has been having severe headaches for the past week. None of her medications are helping.   Patient requests an expedited order for a shot for her migraines. Please assist.   Patient saw Dr. Ruthine Dose yesterday and indicated that she has been having severe headaches. >> Jun 08, 2023  1:23 PM Tiffany H wrote: Patient will be coming tomorrow at 9A for labs. Dr. Ruthine Dose indicated in office visit notes that she wanted patient to have a Lipid Panel. Patient would also like a Comprehensive Metabolic Panel. Please assist.   Spoke to patient and she stated that she did want to see the provider and get labs done as well. Offered patient to come in today for toradol shot per pcp, she stated she would like to wait until tomorrow. Patient scheduled to see pcp at 10 am tomorrow.

## 2023-06-09 ENCOUNTER — Encounter: Payer: Self-pay | Admitting: Family Medicine

## 2023-06-09 ENCOUNTER — Other Ambulatory Visit: Payer: Medicare PPO

## 2023-06-09 ENCOUNTER — Other Ambulatory Visit (HOSPITAL_COMMUNITY): Payer: Self-pay

## 2023-06-09 ENCOUNTER — Ambulatory Visit (HOSPITAL_BASED_OUTPATIENT_CLINIC_OR_DEPARTMENT_OTHER)
Admission: RE | Admit: 2023-06-09 | Discharge: 2023-06-09 | Disposition: A | Discharge status: Home / Self Care | Payer: Medicare PPO | Source: Ambulatory Visit | Attending: Family Medicine | Admitting: Family Medicine

## 2023-06-09 ENCOUNTER — Ambulatory Visit (INDEPENDENT_AMBULATORY_CARE_PROVIDER_SITE_OTHER): Payer: Medicare PPO | Admitting: Family Medicine

## 2023-06-09 VITALS — BP 130/80 | HR 72 | Temp 97.5°F | Resp 18 | Ht 62.0 in | Wt 256.5 lb

## 2023-06-09 DIAGNOSIS — E7800 Pure hypercholesterolemia, unspecified: Secondary | ICD-10-CM

## 2023-06-09 DIAGNOSIS — G43909 Migraine, unspecified, not intractable, without status migrainosus: Secondary | ICD-10-CM | POA: Insufficient documentation

## 2023-06-09 DIAGNOSIS — R7303 Prediabetes: Secondary | ICD-10-CM | POA: Diagnosis not present

## 2023-06-09 DIAGNOSIS — E559 Vitamin D deficiency, unspecified: Secondary | ICD-10-CM

## 2023-06-09 DIAGNOSIS — R700 Elevated erythrocyte sedimentation rate: Secondary | ICD-10-CM

## 2023-06-09 DIAGNOSIS — R519 Headache, unspecified: Secondary | ICD-10-CM

## 2023-06-09 LAB — CBC WITH DIFFERENTIAL/PLATELET
Basophils Absolute: 0 10*3/uL (ref 0.0–0.1)
Basophils Relative: 0.7 % (ref 0.0–3.0)
Eosinophils Absolute: 0.1 10*3/uL (ref 0.0–0.7)
Eosinophils Relative: 1.7 % (ref 0.0–5.0)
HCT: 42.9 % (ref 36.0–46.0)
Hemoglobin: 14 g/dL (ref 12.0–15.0)
Lymphocytes Relative: 31.5 % (ref 12.0–46.0)
Lymphs Abs: 2.2 10*3/uL (ref 0.7–4.0)
MCHC: 32.6 g/dL (ref 30.0–36.0)
MCV: 85.2 fL (ref 78.0–100.0)
Monocytes Absolute: 0.4 10*3/uL (ref 0.1–1.0)
Monocytes Relative: 5.2 % (ref 3.0–12.0)
Neutro Abs: 4.3 10*3/uL (ref 1.4–7.7)
Neutrophils Relative %: 60.9 % (ref 43.0–77.0)
Platelets: 336 10*3/uL (ref 150.0–400.0)
RBC: 5.04 Mil/uL (ref 3.87–5.11)
RDW: 13.9 % (ref 11.5–15.5)
WBC: 7 10*3/uL (ref 4.0–10.5)

## 2023-06-09 LAB — LIPID PANEL
Cholesterol: 244 mg/dL — ABNORMAL HIGH (ref 0–200)
HDL: 59.4 mg/dL (ref >39.00)
LDL Cholesterol: 162 mg/dL — ABNORMAL HIGH (ref 0–99)
NonHDL: 184.6
Total CHOL/HDL Ratio: 4
Triglycerides: 111 mg/dL (ref 0.0–149.0)
VLDL: 22.2 mg/dL (ref 0.0–40.0)

## 2023-06-09 LAB — COMPREHENSIVE METABOLIC PANEL
ALT: 17 U/L (ref 0–35)
AST: 24 U/L (ref 0–37)
Albumin: 4.5 g/dL (ref 3.5–5.2)
Alkaline Phosphatase: 94 U/L (ref 39–117)
BUN: 19 mg/dL (ref 6–23)
CO2: 27 meq/L (ref 19–32)
Calcium: 9.6 mg/dL (ref 8.4–10.5)
Chloride: 98 meq/L (ref 96–112)
Creatinine, Ser: 0.83 mg/dL (ref 0.40–1.20)
GFR: 79.99 mL/min (ref >60.00)
Glucose, Bld: 74 mg/dL (ref 70–99)
Potassium: 4.3 meq/L (ref 3.5–5.1)
Sodium: 140 meq/L (ref 135–145)
Total Bilirubin: 0.8 mg/dL (ref 0.2–1.2)
Total Protein: 7.9 g/dL (ref 6.0–8.3)

## 2023-06-09 LAB — SEDIMENTATION RATE: Sed Rate: 88 mm/h — ABNORMAL HIGH (ref 0–30)

## 2023-06-09 LAB — TSH: TSH: 1.21 u[IU]/mL (ref 0.35–5.50)

## 2023-06-09 LAB — C-REACTIVE PROTEIN: CRP: <1 mg/dL (ref 0.5–20.0)

## 2023-06-09 LAB — HEMOGLOBIN A1C: Hgb A1c MFr Bld: 6 % (ref 4.6–6.5)

## 2023-06-09 LAB — VITAMIN D 25 HYDROXY (VIT D DEFICIENCY, FRACTURES): VITD: 28.61 ng/mL — ABNORMAL LOW (ref 30.00–100.00)

## 2023-06-09 MED ORDER — KETOROLAC TROMETHAMINE 60 MG/2ML IM SOLN
60.0000 mg | Freq: Once | INTRAMUSCULAR | Status: AC
  Administered 2023-06-09: 60 mg via INTRAMUSCULAR

## 2023-06-09 MED ORDER — CYCLOBENZAPRINE HCL 10 MG PO TABS
10.0000 mg | ORAL_TABLET | Freq: Three times a day (TID) | ORAL | 0 refills | Status: DC | PRN
  Filled 2023-06-09 (×2): qty 15, 5d supply, fill #0

## 2023-06-09 MED ORDER — PREDNISONE 20 MG PO TABS
60.0000 mg | ORAL_TABLET | Freq: Every day | ORAL | 0 refills | Status: DC
  Filled 2023-06-09: qty 30, 10d supply, fill #0

## 2023-06-09 NOTE — Patient Instructions (Signed)
Meds sent to pharmacy  Ordering MRI  Worse, ER

## 2023-06-09 NOTE — Telephone Encounter (Signed)
Discussed at appointment.

## 2023-06-09 NOTE — Addendum Note (Signed)
Addended by: Angelena Sole on: 06/09/2023 05:40 PM   Modules accepted: Orders

## 2023-06-09 NOTE — Progress Notes (Addendum)
Subjective:     Patient ID: Sandra Bird, female    DOB: 1970-11-25, 53 y.o.   MRN: [id number]  Chief Complaint  Patient presents with   Migraine    Headache has not gone away, wants a Toradol shot   Blood work    Here to have labs done, cholesterol, etc    HPI HA-AI is having issues so all details not avail.   Pt seen 2 days ago.  Some better today.  But still "severe" H/o hld,preDM  Discussed the use of AI scribe software for clinical note transcription with the patient, who gave verbal consent to proceed.  History of Present Illness   Sandra Bird is a 53 year old female who presents for follow-up on her cholesterol levels and headaches.  She experiences ongoing headaches, described as sharp pains behind her eyes, affecting her temples and the back of her head, sometimes causing tenderness. The headaches can be severe enough to impair daily activities, such as when she had to go straight home instead of picking up her medications. Today, the headaches are not as severe. She experiences occasional blurred vision but no double vision. She suspects the headaches might be related to muscle tension, especially with tight muscles in her neck and back. No fevers or chills, except during hot flashes followed by chills.  She returns for follow-up on her cholesterol levels. Previously, she was taking [place] and working on her diet and exercise, which resulted in improved cholesterol numbers, leading to the discontinuation of the medication. She continues to struggle with diet and exercise and is interested in knowing her current cholesterol levels.  She reports fluctuating blood pressure readings, with a recent high of 155/99, which concerned her. She is unsure about the threshold for seeking emergency care based on blood pressure readings. Currently, her blood pressure is stable.  She has a history of prediabetes, which resolved with diet and exercise, and she wants this checked  again.  She has a history of vitamin D deficiency and takes a prescription of 50,000 IUs of vitamin D once a week. She mentions the need for a dose level check. She is on multiple medications and requires monitoring of kidney and liver function, which was last done in December.       Health Maintenance Due  Topic Date Due   Medicare Annual Wellness (AWV)  Never done    Past Medical History:  Diagnosis Date   Arthritis    SVD (spontaneous vaginal delivery)    x 3    Past Surgical History:  Procedure Laterality Date   ABDOMINOPLASTY/PANNICULECTOMY     CHOLECYSTECTOMY     DILITATION & CURRETTAGE/HYSTROSCOPY WITH NOVASURE ABLATION N/A 07/25/2013   Procedure: DILATATION & CURETTAGE/HYSTEROSCOPY WITH NOVASURE ABLATION;  Surgeon: Miguel Aschoff, MD;  Location: [facility];  Service: Gynecology;  Laterality: N/A;   TONSILLECTOMY     TOTAL HIP ARTHROPLASTY Right 03/24/2020   Procedure: RIGHT TOTAL HIP ARTHROPLASTY ANTERIOR APPROACH;  Surgeon: Marcene Corning, MD;  Location: [facility];  Service: Orthopedics;  Laterality: Right;   TUBAL LIGATION     WISDOM TOOTH EXTRACTION       Current Outpatient Medications:    albuterol (VENTOLIN HFA) 108 (90 Base) MCG/ACT inhaler, Inhale 2 puffs into the lungs every 6 (six) hours as needed for wheezing or shortness of breath., Disp: 6.7 g, Rfl: 0   Betamethasone Diprop-Minoxidil 0.05-7 % SOLN, 1 application every morning, Disp: 60 g, Rfl: 4   Betamethasone Diprop-Minoxidil 0.05-7 %  SOLN, Apply 1 Application topically in the morning., Disp: 60 g, Rfl: 2   betamethasone dipropionate 0.05 % cream, APPLY TO THE HANDS (RASH) TWICE DAILY AS NEEDED FOR TWO WEEKS THEN STOP, Disp: 45 g, Rfl: 3   Boric Acid Vaginal (AZO BORIC ACID) 600 MG SUPP, Compounded Boric Acid 600mg  vaginal capsules  Boric Acid 600mg  vaginal capsules.  DO NOT TAKE ORALLY/TOXIC  insert 1 suppository weekly x 6 weeks; then use prn, Disp: , Rfl:    celecoxib (CELEBREX) 200 MG capsule, Take 1 capsule (200  mg total) by mouth daily with food as needed for pain., Disp: 90 capsule, Rfl: 1   clobetasol cream (TEMOVATE) 0.05 %, Apply topically 2 (two) times daily. Apply to affected areas of hands twice daily x 2 weeks., Disp: 60 g, Rfl: 1   clotrimazole-betamethasone (LOTRISONE) cream, APPLY A THIN COAT DAILY AS NEEDED, Disp: 45 g, Rfl: 2   Crisaborole (EUCRISA) 2 % OINT, Use 1 (one) Application two times daily, Disp: 60 g, Rfl: 1   cyclobenzaprine (FLEXERIL) 10 MG tablet, Take 1 tablet (10 mg total) by mouth 3 (three) times daily as needed for muscle spasms., Disp: 15 tablet, Rfl: 0   hydrOXYzine (ATARAX) 10 MG tablet, Take 1 tablet (10 mg total) by mouth at bedtime., Disp: 30 tablet, Rfl: 0   latanoprost (XALATAN) 0.005 % ophthalmic solution, Place 1 drop into both eyes at bedtime, Disp: 7.5 mL, Rfl: 3   linaclotide (LINZESS) 145 MCG CAPS capsule, Take 1 capsule (145 mcg total) by mouth daily. Before the first meal of the day on an empty stomach., Disp: 30 capsule, Rfl: 6   mirtazapine (REMERON) 15 MG tablet, Take 1 tablet (15 mg total) by mouth at bedtime., Disp: 30 tablet, Rfl: 0   naloxone (NARCAN) nasal spray 4 mg/0.1 mL, Use 1 spray every 3 minutes. Spray 1 dose into ONE nostril; alternate nostrils w each dose until help arrives, Disp: 2 each, Rfl: 1   naloxone (NARCAN) nasal spray 4 mg/0.1 mL, 1 spray every 3 minutes; spray 1 dose into ONE nostril; alternate nostrils w each dose until help arrives, Disp: 2 each, Rfl: 1   naloxone (NARCAN) nasal spray 4 mg/0.1 mL, 1 spray every 3 minutes; spray 1 dose into ONE nostril; alternate nostrils with each dose until help arrives, Disp: 2 each, Rfl: 1   ondansetron (ZOFRAN) 4 MG tablet, Take 2 tablets (8 mg total) by mouth 2 to 3 times per day as needed for nausea and vomiting, Disp: 42 tablet, Rfl: 0   oxyCODONE-acetaminophen (PERCOCET) 10-325 MG tablet, Take 1 tablet by mouth 3 (three) times daily as needed., Disp: 90 tablet, Rfl: 0   oxyCODONE-acetaminophen  (PERCOCET) 10-325 MG tablet, Take 1 tablet by mouth 3 (three) times daily as needed., Disp: 90 tablet, Rfl: 0   oxyCODONE-acetaminophen (PERCOCET) 10-325 MG tablet, Take 1 tablet by mouth 3 (three) times daily as needed., Disp: 90 tablet, Rfl: 0   oxyCODONE-acetaminophen (PERCOCET) 10-325 MG tablet, Take 1 tablet by mouth 3 (three) times daily as needed., Disp: 90 tablet, Rfl: 0   oxyCODONE-acetaminophen (PERCOCET) 10-325 MG tablet, Take 1 tablet by mouth 4 (four) times daily as needed., Disp: 120 tablet, Rfl: 0   oxyCODONE-acetaminophen (PERCOCET) 10-325 MG tablet, Take 1 tablet by mouth 4 (four) times daily as needed., Disp: 120 tablet, Rfl: 0   oxyCODONE-acetaminophen (PERCOCET) 10-325 MG tablet, Take 1 tablet by mouth 4 (four) times daily as needed., Disp: 120 tablet, Rfl: 0   oxyCODONE-acetaminophen (PERCOCET)  10-325 MG tablet, Take 1 (one) tablet by mouth four times daily, as needed, Disp: 120 tablet, Rfl: 0   pantoprazole (PROTONIX) 40 MG tablet, Take 1 tablet (40 mg total) by mouth daily., Disp: 30 tablet, Rfl: 2   phentermine (ADIPEX-P) 37.5 MG tablet, Take 1 tablet (37.5 mg) by mouth daily before breakfast., Disp: 30 tablet, Rfl: 2   Vitamin D, Ergocalciferol, (DRISDOL) 1.25 MG (50000 UNIT) CAPS capsule, Take 1 capsule (50,000 Units total) by mouth once a week., Disp: 12 capsule, Rfl: 2  Allergies  Allergen Reactions   Metronidazole Nausea And Vomiting    Other Reaction(s): vomiting  metronidazole   Penicillin G Rash   Penicillins Other (See Comments) and Rash    Has patient had a PCN reaction causing immediate rash, facial/tongue/throat swelling, SOB or lightheadedness with hypotension: Yes  Has patient had a PCN reaction causing severe rash involving mucus membranes or skin necrosis: No  Has patient had a PCN reaction that required hospitalization No  Has patient had a PCN reaction occurring within the last 10 years: No  If all of the above answers are "NO", then may proceed  with Cephalosporin use.   ROS neg/noncontributory except as noted HPI/below      Objective:     BP 130/80   Pulse 72   Temp (!) 97.5 F (36.4 C) (Temporal)   Resp 18   Ht 5\' 2"  (1.575 m)   Wt 256 lb 8 oz (116.3 kg)   SpO2 99%   BMI 46.91 kg/m  Wt Readings from Last 3 Encounters:  06/09/23 256 lb 8 oz (116.3 kg)  06/07/23 256 lb 8 oz (116.3 kg)  04/05/23 252 lb 4 oz (114.4 kg)    Physical Exam   Gen: WDWN NAD HEENT: NCAT, conjunctiva not injected, sclera nonicteric NECK:  supple, no thyromegaly, no nodes, no carotid bruits CARDIAC: RRR, S1S2+, no murmur.  ABDOMEN:  BS+, soft, NTND, No HSM, no masses EXT:  no edema MSK: no gross abnormalities.  NEURO: A&O x3.  CN II-XII intact.  PSYCH: normal mood. Good eye contact Kernig/brudzinski neg.  Some TTP scalp.  Tight neck muscles     Assessment & Plan:  Migraine without status migrainosus, not intractable, unspecified migraine type -     Ketorolac Tromethamine -     Sedimentation rate -     C-reactive protein -     CBC with Differential/Platelet -     MR BRAIN WO CONTRAST; Future  Pure hypercholesterolemia -     Comprehensive metabolic panel -     Lipid panel -     TSH  Prediabetes -     Comprehensive metabolic panel -     TSH -     Hemoglobin A1c  Vitamin D deficiency -     VITAMIN D 25 Hydroxy (Vit-D Deficiency, Fractures)  Other orders -     Cyclobenzaprine HCl; Take 1 tablet (10 mg total) by mouth 3 (three) times daily as needed for muscle spasms.  Dispense: 15 tablet; Refill: 0   HA-no h/o migraine.  Per pt "severe" and on oxy, celebrex.  Given injection of toradol.  Flexeril 10mg  tid prn.  Stat mri.  Worse, etc, ER.  Check labs HLD-chronic.  Was controlled on wt loss, but pt struggling.  Check labs.  On meds in past preDM-working on diet/some exercise.  Check labs Vitamin D def-chronic  on 50k weekly.  Check D Assessment and Plan    Headache Persistent headaches with varying severity,  including  sharp pains behind the eyes, temples, and back of the head, are noted. Physical exam shows tenderness and tight muscles in the neck and back. Differential diagnosis includes muscle tension headache, migraine, and other intracranial pathology. No fever or chills are present. Blood pressure fluctuations are noted, with a recent reading of 155/99 mmHg. Discussed Toradol shot, muscle relaxers, and imaging. Prefers to avoid MRI unless necessary. Administer Toradol shot and prescribe Flexeril. Order blood work and stat MRI of the brain. Advise ER visit if symptoms acutely worsen or new symptoms develop.  Hypertension Blood pressure shows significant variability, though today's reading is within normal range. Discussed the relationship between headaches and blood pressure. Monitor blood pressure regularly and advise ER visit if experiencing severe symptoms such as chest pain or severe headache.  Hyperlipidemia Previously managed with Saxenda, diet, and exercise, resulting in good cholesterol levels and discontinuation of medication. Struggles with diet and exercise, seeking current cholesterol level assessment. Order lipid panel.  Prediabetes Resolved with diet and exercise. Needs current status assessment. Order HbA1c test.  Vitamin D Deficiency On 50,000 IUs of vitamin D weekly. Needs dose level check. Order vitamin D level test.  Medication Monitoring On multiple medications requiring monitoring of kidney and liver function. Last tests done in December. Order kidney and liver function tests.  General Health Maintenance Scheduled to see a gynecologist on Monday for hot flashes. Follow up with gynecologist for hot flashes.  Follow-up Ensure phone availability for MRI scheduling and follow up on lab results.        Return if symptoms worsen or fail to improve.  Angelena Sole, MD  Addendum-labs ESR 88.  Concern for possibly giant cell arteritis d/t new onset ha and elevated ESR.  LMAM sending in  prednisone and refer IR for poss bx.

## 2023-06-09 NOTE — Addendum Note (Signed)
Addended by: Angelena Sole on: 06/09/2023 05:07 PM   Modules accepted: Orders

## 2023-06-10 ENCOUNTER — Encounter: Payer: Self-pay | Admitting: Family Medicine

## 2023-06-10 ENCOUNTER — Other Ambulatory Visit: Payer: Self-pay | Admitting: Family Medicine

## 2023-06-10 MED ORDER — PREDNISONE 20 MG PO TABS: 60.0000 mg | ORAL_TABLET | Freq: Every day | ORAL | 0 refills | Status: DC

## 2023-06-10 MED ORDER — ATORVASTATIN CALCIUM 20 MG PO TABS: 20.0000 mg | ORAL_TABLET | Freq: Every day | ORAL | 1 refills | Status: DC

## 2023-06-10 NOTE — Progress Notes (Signed)
D/w pt.  See note 2/15

## 2023-06-10 NOTE — Progress Notes (Signed)
Bp 110/74 Spoke to pt re labs/MRI and plan She may have giant cell arteritis.  Has not picked up prednisone yet and pharm closed so sent to [facility].  Explained possible course of GCA and NEED to be on steroids.  Vascular will do the bx.  She can call them on Monday to get set up.   Depending on results, will discuss next course of action.  HA better today.  Scalp was very TTP yesterday.  Elevated ESR can be other source but given new onset HA and tenderness scalp, need to r/o GCA.    HLD-on lipitor 20 in past but off when lost wt.  Cholesterol high again.  Will restart.    Keep f/u appt 2/21

## 2023-06-12 ENCOUNTER — Telehealth: Payer: Self-pay

## 2023-06-12 ENCOUNTER — Other Ambulatory Visit (HOSPITAL_COMMUNITY): Payer: Self-pay

## 2023-06-12 NOTE — Telephone Encounter (Signed)
Patient stated that she is not interested in surgery at this time.  She would expect to meet Dr Karin Lieu and discuss surgical need prior to scheduling.  Dr Karin Lieu and Dr Ruthine Dose informed.

## 2023-06-12 NOTE — Telephone Encounter (Signed)
 Attempted to call for surgery scheduling. LVM

## 2023-06-14 ENCOUNTER — Ambulatory Visit (INDEPENDENT_AMBULATORY_CARE_PROVIDER_SITE_OTHER): Payer: Medicare PPO | Admitting: Vascular Surgery

## 2023-06-14 ENCOUNTER — Encounter (HOSPITAL_COMMUNITY): Payer: Self-pay | Admitting: Vascular Surgery

## 2023-06-14 ENCOUNTER — Encounter: Payer: Self-pay | Admitting: Vascular Surgery

## 2023-06-14 ENCOUNTER — Other Ambulatory Visit: Payer: Self-pay

## 2023-06-14 VITALS — BP 158/78 | HR 78 | Temp 98.0°F | Resp 20 | Ht 62.0 in | Wt 254.0 lb

## 2023-06-14 DIAGNOSIS — M316 Other giant cell arteritis: Secondary | ICD-10-CM | POA: Diagnosis not present

## 2023-06-14 NOTE — Progress Notes (Signed)
Patient ID: Sandra Bird, female   DOB: Feb 13, 1971, 53 y.o.   MRN: [id number]  Reason for Consult: New Patient (Initial Visit)   Referred by Jeani Sow, MD  Subjective:     HPI:  Sandra Bird is a 53 y.o. female with approximately 1 month history of headaches which have been generalized in nature.  She denies any previous history of headaches prior to this.  She denies any fevers or but does have chills due to menopause.  She has not had any direct head pain or visual changes although she does have glaucoma and wears glasses.  She has been started on steroids last weekend and states that she has not had headaches since that time.  Past Medical History:  Diagnosis Date   Arthritis    SVD (spontaneous vaginal delivery)    x 3   Family History  Problem Relation Age of Onset   Diabetes Mother    Heart failure Mother    Heart attack Father 36   Past Surgical History:  Procedure Laterality Date   ABDOMINOPLASTY/PANNICULECTOMY     CHOLECYSTECTOMY     DILITATION & CURRETTAGE/HYSTROSCOPY WITH NOVASURE ABLATION N/A 07/25/2013   Procedure: DILATATION & CURETTAGE/HYSTEROSCOPY WITH NOVASURE ABLATION;  Surgeon: Miguel Aschoff, MD;  Location: [facility];  Service: Gynecology;  Laterality: N/A;   TONSILLECTOMY     TOTAL HIP ARTHROPLASTY Right 03/24/2020   Procedure: RIGHT TOTAL HIP ARTHROPLASTY ANTERIOR APPROACH;  Surgeon: Marcene Corning, MD;  Location: [facility];  Service: Orthopedics;  Laterality: Right;   TUBAL LIGATION     WISDOM TOOTH EXTRACTION      Short Social History:  Social History   Tobacco Use   Smoking status: Never   Smokeless tobacco: Never  Substance Use Topics   Alcohol use: Yes    Comment: socially    Allergies  Allergen Reactions   Metronidazole Nausea And Vomiting    Other Reaction(s): vomiting  metronidazole   Penicillin G Rash   Penicillins Other (See Comments) and Rash    Has patient had a PCN reaction causing immediate rash, facial/tongue/throat  swelling, SOB or lightheadedness with hypotension: Yes  Has patient had a PCN reaction causing severe rash involving mucus membranes or skin necrosis: No  Has patient had a PCN reaction that required hospitalization No  Has patient had a PCN reaction occurring within the last 10 years: No  If all of the above answers are "NO", then may proceed with Cephalosporin use.    Current Outpatient Medications  Medication Sig Dispense Refill   albuterol (VENTOLIN HFA) 108 (90 Base) MCG/ACT inhaler Inhale 2 puffs into the lungs every 6 (six) hours as needed for wheezing or shortness of breath. 6.7 g 0   atorvastatin (LIPITOR) 20 MG tablet Take 1 tablet (20 mg total) by mouth daily. 90 tablet 1   Betamethasone Diprop-Minoxidil 0.05-7 % SOLN 1 application every morning 60 g 4   Betamethasone Diprop-Minoxidil 0.05-7 % SOLN Apply 1 Application topically in the morning. 60 g 2   betamethasone dipropionate 0.05 % cream APPLY TO THE HANDS (RASH) TWICE DAILY AS NEEDED FOR TWO WEEKS THEN STOP 45 g 3   Boric Acid Vaginal (AZO BORIC ACID) 600 MG SUPP Compounded Boric Acid 600mg  vaginal capsules  Boric Acid 600mg  vaginal capsules.  DO NOT TAKE ORALLY/TOXIC  insert 1 suppository weekly x 6 weeks; then use prn     celecoxib (CELEBREX) 200 MG capsule Take 1 capsule (200 mg total) by mouth daily with  food as needed for pain. 90 capsule 1   clobetasol cream (TEMOVATE) 0.05 % Apply topically 2 (two) times daily. Apply to affected areas of hands twice daily x 2 weeks. 60 g 1   clotrimazole-betamethasone (LOTRISONE) cream APPLY A THIN COAT DAILY AS NEEDED 45 g 2   Crisaborole (EUCRISA) 2 % OINT Use 1 (one) Application two times daily 60 g 1   cyclobenzaprine (FLEXERIL) 10 MG tablet Take 1 tablet (10 mg total) by mouth 3 (three) times daily as needed for muscle spasms. 15 tablet 0   hydrOXYzine (ATARAX) 10 MG tablet Take 1 tablet (10 mg total) by mouth at bedtime. 30 tablet 0   latanoprost (XALATAN) 0.005 % ophthalmic  solution Place 1 drop into both eyes at bedtime 7.5 mL 3   linaclotide (LINZESS) 145 MCG CAPS capsule Take 1 capsule (145 mcg total) by mouth daily. Before the first meal of the day on an empty stomach. 30 capsule 6   mirtazapine (REMERON) 15 MG tablet Take 1 tablet (15 mg total) by mouth at bedtime. 30 tablet 0   naloxone (NARCAN) nasal spray 4 mg/0.1 mL Use 1 spray every 3 minutes. Spray 1 dose into ONE nostril; alternate nostrils w each dose until help arrives 2 each 1   naloxone (NARCAN) nasal spray 4 mg/0.1 mL 1 spray every 3 minutes; spray 1 dose into ONE nostril; alternate nostrils w each dose until help arrives 2 each 1   naloxone (NARCAN) nasal spray 4 mg/0.1 mL 1 spray every 3 minutes; spray 1 dose into ONE nostril; alternate nostrils with each dose until help arrives 2 each 1   ondansetron (ZOFRAN) 4 MG tablet Take 2 tablets (8 mg total) by mouth 2 to 3 times per day as needed for nausea and vomiting 42 tablet 0   oxyCODONE-acetaminophen (PERCOCET) 10-325 MG tablet Take 1 (one) tablet by mouth four times daily, as needed 120 tablet 0   pantoprazole (PROTONIX) 40 MG tablet Take 1 tablet (40 mg total) by mouth daily. 30 tablet 2   phentermine (ADIPEX-P) 37.5 MG tablet Take 1 tablet (37.5 mg) by mouth daily before breakfast. 30 tablet 2   predniSONE (DELTASONE) 20 MG tablet Take 3 tablets (60 mg total) by mouth daily with breakfast. 30 tablet 0   Vitamin D, Ergocalciferol, (DRISDOL) 1.25 MG (50000 UNIT) CAPS capsule Take 1 capsule (50,000 Units total) by mouth once a week. 12 capsule 2   No current facility-administered medications for this visit.    Review of Systems  Constitutional:  Constitutional negative. HENT:       Alopecia Eyes: Eyes negative.  Respiratory: Respiratory negative.  Cardiovascular: Cardiovascular negative.  GI: Gastrointestinal negative.  Musculoskeletal: Musculoskeletal negative.  Skin: Skin negative.  Neurological: Positive for headaches.  Hematologic:  Hematologic/lymphatic negative.  Psychiatric: Psychiatric negative.        Objective:  Objective   Vitals:   06/14/23 0837  BP: (!) 158/78  Pulse: 78  Resp: 20  Temp: 98 F (36.7 C)  SpO2: 98%  Weight: 254 lb (115.2 kg)  Height: 5\' 2"  (1.575 m)   Body mass index is 46.46 kg/m.  Physical Exam HENT:     Head: Normocephalic.     Nose: Nose normal.  Eyes:     Pupils: Pupils are equal, round, and reactive to light.  Cardiovascular:     Rate and Rhythm: Normal rate.  Pulmonary:     Effort: Pulmonary effort is normal.  Abdominal:     General: Abdomen is  flat.  Musculoskeletal:        General: Normal range of motion.     Cervical back: Normal range of motion and neck supple.  Skin:    General: Skin is warm.     Capillary Refill: Capillary refill takes less than 2 seconds.  Neurological:     General: No focal deficit present.     Mental Status: She is alert.  Psychiatric:        Mood and Affect: Mood normal.        Thought Content: Thought content normal.        Judgment: Judgment normal.     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  I discussed with her the nature of giant cell arteritis with concern for visual loss without steroid treatment she demonstrates very good understanding of this.  I also discussed that biopsy of the temporal artery may be skewed by steroid use but the utility of temporal artery biopsy would be to give [place] a diagnosis to direct therapy going forward.  We discussed the risk and benefits of proceeding and she demonstrates good understanding we will get this scheduled in the near future.     Maeola Harman MD [facility]

## 2023-06-14 NOTE — Progress Notes (Signed)
PCP - Jeani Sow, MD  Cardiologist -   PPM/ICD - denies Device Orders - n/a Rep Notified - n/a  Chest x-ray -  EKG - DOS Stress Test -  ECHO -  Cardiac Cath -   CPAP - Sleep test 5 years ago, everything was fine per patient  GLP-1 -Semaglutide, ( OZEMPIC) LAST DOSE 06-12-23    Blood Thinner Instructions: denies Aspirin Instructions: n/a  ERAS Protcol - NPO  COVID TEST- n/a  Anesthesia review: no  Patient verbally denies any shortness of breath, fever, cough and chest pain during phone call   -------------  SDW INSTRUCTIONS given:  Your procedure is scheduled on June 16, 2023.  Report to [facility] Main Entrance "A" at 9:30 A.M., and check in at the Admitting office.  Call this number if you have problems the morning of surgery:  [phone]   Remember:  Do not eat or drink after midnight the night before your surgery      Take these medicines the morning of surgery with A SIP OF WATER  atorvastatin (LIPITOR)  pantoprazole (PROTONIX)  predniSONE (DELTASONE)   IF NEEDED cyclobenzaprine (FLEXERIL)  albuterol (VENTOLIN HFA) inhaler  hydrOXYzine (ATARAX)  naloxone (NARCAN) nasal spray  ondansetron (ZOFRAN)  oxyCODONE-acetaminophen (PERCOCET)    Semaglutide OZEMPIC LAST DOSE:  As of today, STOP taking any Aspirin (unless otherwise instructed by your surgeon) Aleve, Naproxen, Ibuprofen, Motrin, Advil, Goody's, BC's, all herbal medications, fish oil, and all vitamins.  THIS INCLUDES YOUR celecoxib (CELEBREX).                       Do not wear jewelry, make up, or nail polish            Do not wear lotions, powders, perfumes/colognes, or deodorant.            Do not shave 48 hours prior to surgery.  Men may shave face and neck.            Do not bring valuables to the hospital.            [facility] is not responsible for any belongings or valuables.  Do NOT Smoke (Tobacco/Vaping) 24 hours prior to your procedure If you use a CPAP at night, you  may bring all equipment for your overnight stay.   Contacts, glasses, dentures or bridgework may not be worn into surgery.      For patients admitted to the hospital, discharge time will be determined by your treatment team.   Patients discharged the day of surgery will not be allowed to drive home, and someone needs to stay with them for 24 hours.    Special instructions:   - Preparing For Surgery  Before surgery, you can play an important role. Because skin is not sterile, your skin needs to be as free of germs as possible. You can reduce the number of germs on your skin by washing with CHG (chlorahexidine gluconate) Soap before surgery.  CHG is an antiseptic cleaner which kills germs and bonds with the skin to continue killing germs even after washing.    Oral Hygiene is also important to reduce your risk of infection.  Remember - BRUSH YOUR TEETH THE MORNING OF SURGERY WITH YOUR REGULAR TOOTHPASTE  Please do not use if you have an allergy to CHG or antibacterial soaps. If your skin becomes reddened/irritated stop using the CHG.  Do not shave (including legs and underarms) for at least 48  hours prior to first CHG shower. It is OK to shave your face.  Please follow these instructions carefully.   Shower the NIGHT BEFORE SURGERY and the MORNING OF SURGERY with DIAL Soap.   Pat yourself dry with a CLEAN TOWEL.  Wear CLEAN PAJAMAS to bed the night before surgery  Place CLEAN SHEETS on your bed the night of your first shower and DO NOT SLEEP WITH PETS.   Day of Surgery: Please shower morning of surgery  Wear Clean/Comfortable clothing the morning of surgery Do not apply any deodorants/lotions.   Remember to brush your teeth WITH YOUR REGULAR TOOTHPASTE.   Questions were answered. Patient verbalized understanding of instructions.

## 2023-06-15 ENCOUNTER — Other Ambulatory Visit (HOSPITAL_COMMUNITY): Payer: Self-pay

## 2023-06-15 NOTE — Progress Notes (Signed)
patient voiced understanding of new arrival time of 0530 tomorrow

## 2023-06-16 ENCOUNTER — Ambulatory Visit: Payer: Medicare PPO | Admitting: Family Medicine

## 2023-06-16 ENCOUNTER — Ambulatory Visit (HOSPITAL_BASED_OUTPATIENT_CLINIC_OR_DEPARTMENT_OTHER): Payer: Medicare PPO | Admitting: Anesthesiology

## 2023-06-16 ENCOUNTER — Encounter (HOSPITAL_COMMUNITY)
Admission: RE | Disposition: A | Discharge status: Home / Self Care | Payer: Self-pay | Source: Home / Self Care | Attending: Vascular Surgery

## 2023-06-16 ENCOUNTER — Other Ambulatory Visit: Payer: Self-pay

## 2023-06-16 ENCOUNTER — Ambulatory Visit (HOSPITAL_COMMUNITY): Payer: Medicare PPO | Admitting: Anesthesiology

## 2023-06-16 ENCOUNTER — Ambulatory Visit (HOSPITAL_COMMUNITY)
Admission: RE | Admit: 2023-06-16 | Discharge: 2023-06-16 | Disposition: A | Discharge status: Home / Self Care | Payer: Medicare PPO | Attending: Vascular Surgery | Admitting: Vascular Surgery

## 2023-06-16 DIAGNOSIS — R700 Elevated erythrocyte sedimentation rate: Secondary | ICD-10-CM | POA: Diagnosis not present

## 2023-06-16 DIAGNOSIS — Z6841 Body Mass Index (BMI) 40.0 and over, adult: Secondary | ICD-10-CM | POA: Insufficient documentation

## 2023-06-16 DIAGNOSIS — R519 Headache, unspecified: Secondary | ICD-10-CM | POA: Insufficient documentation

## 2023-06-16 DIAGNOSIS — M1990 Unspecified osteoarthritis, unspecified site: Secondary | ICD-10-CM | POA: Insufficient documentation

## 2023-06-16 DIAGNOSIS — E7800 Pure hypercholesterolemia, unspecified: Secondary | ICD-10-CM | POA: Diagnosis not present

## 2023-06-16 DIAGNOSIS — N289 Disorder of kidney and ureter, unspecified: Secondary | ICD-10-CM | POA: Diagnosis not present

## 2023-06-16 DIAGNOSIS — E66813 Obesity, class 3: Secondary | ICD-10-CM | POA: Insufficient documentation

## 2023-06-16 DIAGNOSIS — H409 Unspecified glaucoma: Secondary | ICD-10-CM | POA: Diagnosis not present

## 2023-06-16 DIAGNOSIS — M316 Other giant cell arteritis: Secondary | ICD-10-CM

## 2023-06-16 HISTORY — PX: ARTERY BIOPSY: SHX891

## 2023-06-16 LAB — POCT I-STAT, CHEM 8
BUN: 31 mg/dL — ABNORMAL HIGH (ref 6–20)
Calcium, Ion: 1.22 mmol/L (ref 1.15–1.40)
Chloride: 103 mmol/L (ref 98–111)
Creatinine, Ser: 1.1 mg/dL — ABNORMAL HIGH (ref 0.44–1.00)
Glucose, Bld: 99 mg/dL (ref 70–99)
HCT: 45 % (ref 36.0–46.0)
Hemoglobin: 15.3 g/dL — ABNORMAL HIGH (ref 12.0–15.0)
Potassium: 4.6 mmol/L (ref 3.5–5.1)
Sodium: 138 mmol/L (ref 135–145)
TCO2: 27 mmol/L (ref 22–32)

## 2023-06-16 LAB — GLUCOSE, CAPILLARY: Glucose-Capillary: 103 mg/dL — ABNORMAL HIGH (ref 70–99)

## 2023-06-16 SURGERY — BIOPSY TEMPORAL ARTERY
Anesthesia: General | Laterality: Left

## 2023-06-16 MED ORDER — MIDAZOLAM HCL 2 MG/2ML IJ SOLN
INTRAMUSCULAR | Status: AC
  Filled 2023-06-16: qty 2

## 2023-06-16 MED ORDER — 0.9 % SODIUM CHLORIDE (POUR BTL) OPTIME
TOPICAL | Status: DC | PRN
Start: 2023-06-16 — End: 2023-06-16
  Administered 2023-06-16: 1000 mL

## 2023-06-16 MED ORDER — LIDOCAINE-EPINEPHRINE (PF) 1 %-1:200000 IJ SOLN
INTRAMUSCULAR | Status: AC
  Filled 2023-06-16: qty 30

## 2023-06-16 MED ORDER — FENTANYL CITRATE (PF) 250 MCG/5ML IJ SOLN
INTRAMUSCULAR | Status: DC | PRN
  Administered 2023-06-16: 100 ug via INTRAVENOUS

## 2023-06-16 MED ORDER — SODIUM CHLORIDE 0.9 % IV SOLN: INTRAVENOUS | Status: DC

## 2023-06-16 MED ORDER — ONDANSETRON HCL 4 MG/2ML IJ SOLN
INTRAMUSCULAR | Status: DC | PRN
  Administered 2023-06-16: 4 mg via INTRAVENOUS

## 2023-06-16 MED ORDER — VANCOMYCIN HCL 1500 MG/300ML IV SOLN
1500.0000 mg | INTRAVENOUS | Status: AC
  Administered 2023-06-16: 1500 mg via INTRAVENOUS
  Filled 2023-06-16: qty 300

## 2023-06-16 MED ORDER — LIDOCAINE 2% (20 MG/ML) 5 ML SYRINGE
INTRAMUSCULAR | Status: AC
  Filled 2023-06-16: qty 5

## 2023-06-16 MED ORDER — ACETAMINOPHEN 500 MG PO TABS
1000.0000 mg | ORAL_TABLET | Freq: Once | ORAL | Status: AC
  Administered 2023-06-16: 1000 mg via ORAL
  Filled 2023-06-16: qty 2

## 2023-06-16 MED ORDER — MIDAZOLAM HCL 2 MG/2ML IJ SOLN
INTRAMUSCULAR | Status: DC | PRN
  Administered 2023-06-16: 2 mg via INTRAVENOUS

## 2023-06-16 MED ORDER — SUCCINYLCHOLINE CHLORIDE 200 MG/10ML IV SOSY
PREFILLED_SYRINGE | INTRAVENOUS | Status: DC | PRN
  Administered 2023-06-16: 100 mg via INTRAVENOUS

## 2023-06-16 MED ORDER — LIDOCAINE-EPINEPHRINE (PF) 1 %-1:200000 IJ SOLN
INTRAMUSCULAR | Status: DC | PRN
  Administered 2023-06-16: 2 mL

## 2023-06-16 MED ORDER — DEXAMETHASONE SODIUM PHOSPHATE 10 MG/ML IJ SOLN
INTRAMUSCULAR | Status: DC | PRN
  Administered 2023-06-16: 5 mg via INTRAVENOUS

## 2023-06-16 MED ORDER — LIDOCAINE 2% (20 MG/ML) 5 ML SYRINGE
INTRAMUSCULAR | Status: DC | PRN
  Administered 2023-06-16: 60 mg via INTRAVENOUS

## 2023-06-16 MED ORDER — FENTANYL CITRATE (PF) 250 MCG/5ML IJ SOLN
INTRAMUSCULAR | Status: AC
  Filled 2023-06-16: qty 5

## 2023-06-16 MED ORDER — CHLORHEXIDINE GLUCONATE 4 % EX SOLN: 60.0000 mL | Freq: Once | CUTANEOUS | Status: DC

## 2023-06-16 MED ORDER — LACTATED RINGERS IV SOLN: INTRAVENOUS | Status: DC | PRN

## 2023-06-16 MED ORDER — OXYCODONE HCL 5 MG PO TABS: 5.0000 mg | ORAL_TABLET | Freq: Once | ORAL | Status: AC | PRN

## 2023-06-16 MED ORDER — FENTANYL CITRATE (PF) 100 MCG/2ML IJ SOLN
25.0000 ug | INTRAMUSCULAR | Status: DC | PRN
  Administered 2023-06-16: 25 ug via INTRAVENOUS
  Administered 2023-06-16: 50 ug via INTRAVENOUS
  Administered 2023-06-16: 25 ug via INTRAVENOUS

## 2023-06-16 MED ORDER — ORAL CARE MOUTH RINSE: 15.0000 mL | Freq: Once | OROMUCOSAL | Status: AC

## 2023-06-16 MED ORDER — PROPOFOL 10 MG/ML IV BOLUS
INTRAVENOUS | Status: AC
  Filled 2023-06-16: qty 20

## 2023-06-16 MED ORDER — OXYCODONE HCL 5 MG/5ML PO SOLN
5.0000 mg | Freq: Once | ORAL | Status: AC | PRN
  Administered 2023-06-16: 5 mg via ORAL

## 2023-06-16 MED ORDER — CHLORHEXIDINE GLUCONATE 0.12 % MT SOLN
15.0000 mL | Freq: Once | OROMUCOSAL | Status: AC
  Administered 2023-06-16: 15 mL via OROMUCOSAL
  Filled 2023-06-16: qty 15

## 2023-06-16 MED ORDER — AMISULPRIDE (ANTIEMETIC) 5 MG/2ML IV SOLN: 10.0000 mg | Freq: Once | INTRAVENOUS | Status: DC | PRN

## 2023-06-16 MED ORDER — OXYCODONE HCL 5 MG/5ML PO SOLN
ORAL | Status: AC
  Filled 2023-06-16: qty 5

## 2023-06-16 MED ORDER — ROCURONIUM BROMIDE 10 MG/ML (PF) SYRINGE
PREFILLED_SYRINGE | INTRAVENOUS | Status: AC
  Filled 2023-06-16: qty 10

## 2023-06-16 MED ORDER — PROPOFOL 10 MG/ML IV BOLUS
INTRAVENOUS | Status: DC | PRN
  Administered 2023-06-16: 150 mg via INTRAVENOUS
  Administered 2023-06-16: 20 mg via INTRAVENOUS

## 2023-06-16 MED ORDER — FENTANYL CITRATE (PF) 100 MCG/2ML IJ SOLN
INTRAMUSCULAR | Status: AC
  Filled 2023-06-16: qty 2

## 2023-06-16 SURGICAL SUPPLY — 40 items
APPLIER CLIP 9.375 SM OPEN (CLIP) ×1
BAG COUNTER SPONGE SURGICOUNT (BAG) ×1 IMPLANT
CANISTER SUCT 3000ML PPV (MISCELLANEOUS) ×1 IMPLANT
CLIP APPLIE 9.375 SM OPEN (CLIP) ×1 IMPLANT
CLIP TI MEDIUM 6 (CLIP) ×2 IMPLANT
CLIP TI WIDE RED SMALL 6 (CLIP) ×2 IMPLANT
CNTNR URN SCR LID CUP LEK RST (MISCELLANEOUS) ×1 IMPLANT
COTTONBALL LRG STERILE PKG (GAUZE/BANDAGES/DRESSINGS) ×1 IMPLANT
COVER SURGICAL LIGHT HANDLE (MISCELLANEOUS) ×1 IMPLANT
DERMABOND ADVANCED .7 DNX12 (GAUZE/BANDAGES/DRESSINGS) ×1 IMPLANT
DERMABOND ADVANCED .7 DNX6 (GAUZE/BANDAGES/DRESSINGS) IMPLANT
DRAPE OPHTHALMIC 77X100 STRL (CUSTOM PROCEDURE TRAY) ×1 IMPLANT
DRSG TEGADERM 2-3/8X2-3/4 SM (GAUZE/BANDAGES/DRESSINGS) IMPLANT
ELECT NDL BLADE 2-5/6 (NEEDLE) ×1 IMPLANT
ELECT NEEDLE BLADE 2-5/6 (NEEDLE) ×1
ELECT REM PT RETURN 9FT ADLT (ELECTROSURGICAL) ×1
ELECTRODE REM PT RTRN 9FT ADLT (ELECTROSURGICAL) ×1 IMPLANT
GAUZE 4X4 16PLY ~~LOC~~+RFID DBL (SPONGE) ×1 IMPLANT
GAUZE SPONGE 4X4 12PLY STRL (GAUZE/BANDAGES/DRESSINGS) IMPLANT
GEL ULTRASOUND 8.5O AQUASONIC (MISCELLANEOUS) ×1 IMPLANT
GLOVE BIOGEL PI IND STRL 8 (GLOVE) ×1 IMPLANT
GOWN STRL REUS W/ TWL LRG LVL3 (GOWN DISPOSABLE) ×1 IMPLANT
GOWN STRL REUS W/TWL 2XL LVL3 (GOWN DISPOSABLE) ×2 IMPLANT
KIT BASIN OR (CUSTOM PROCEDURE TRAY) ×1 IMPLANT
KIT TURNOVER KIT B (KITS) ×1 IMPLANT
NDL HYPO 25GX1X1/2 BEV (NEEDLE) ×1 IMPLANT
NEEDLE HYPO 25GX1X1/2 BEV (NEEDLE) ×1
NS IRRIG 1000ML POUR BTL (IV SOLUTION) ×1 IMPLANT
PACK GENERAL/GYN (CUSTOM PROCEDURE TRAY) ×1 IMPLANT
PAD ARMBOARD 7.5X6 YLW CONV (MISCELLANEOUS) ×2 IMPLANT
SPIKE FLUID TRANSFER (MISCELLANEOUS) ×1 IMPLANT
SUCTION TUBE FRAZIER 10FR DISP (SUCTIONS) ×1 IMPLANT
SUT MNCRL AB 4-0 PS2 18 (SUTURE) ×1 IMPLANT
SUT PROLENE 6 0 BV (SUTURE) IMPLANT
SUT SILK 3-0 18XBRD TIE 12 (SUTURE) IMPLANT
SUT VIC AB 3-0 SH 27X BRD (SUTURE) ×1 IMPLANT
SYR CONTROL 10ML LL (SYRINGE) ×1 IMPLANT
TOWEL GREEN STERILE (TOWEL DISPOSABLE) ×1 IMPLANT
VASCULAR TIE MINI RED 18IN STL (MISCELLANEOUS) ×1 IMPLANT
WATER STERILE IRR 1000ML POUR (IV SOLUTION) ×1 IMPLANT

## 2023-06-16 NOTE — H&P (Signed)
Patient seen and examined in preop holding.  No complaints. No changes to medication history or physical exam since last seen in clinic. After discussing the risks and benefits of left temporal artery biopsy , Sandra Bird elected to proceed.   Victorino Sparrow MD     Patient ID: Donivan Scull, female   DOB: 1970-07-28, 53 y.o.   MRN: [id number]   Reason for Consult: New Patient (Initial Visit)   Referred by Jeani Sow, MD   Subjective:    Subjective HPI:   Sandra Bird is a 53 y.o. female with approximately 1 month history of headaches which have been generalized in nature.  She denies any previous history of headaches prior to this.  She denies any fevers or but does have chills due to menopause.  She has not had any direct head pain or visual changes although she does have glaucoma and wears glasses.  She has been started on steroids last weekend and states that she has not had headaches since that time.       Past Medical History:  Diagnosis Date   Arthritis     SVD (spontaneous vaginal delivery)      x 3             Family History  Problem Relation Age of Onset   Diabetes Mother     Heart failure Mother     Heart attack Father 55             Past Surgical History:  Procedure Laterality Date   ABDOMINOPLASTY/PANNICULECTOMY       CHOLECYSTECTOMY       DILITATION & CURRETTAGE/HYSTROSCOPY WITH NOVASURE ABLATION N/A 07/25/2013    Procedure: DILATATION & CURETTAGE/HYSTEROSCOPY WITH NOVASURE ABLATION;  Surgeon: Miguel Aschoff, MD;  Location: [facility];  Service: Gynecology;  Laterality: N/A;   TONSILLECTOMY       TOTAL HIP ARTHROPLASTY Right 03/24/2020    Procedure: RIGHT TOTAL HIP ARTHROPLASTY ANTERIOR APPROACH;  Surgeon: Marcene Corning, MD;  Location: [facility];  Service: Orthopedics;  Laterality: Right;   TUBAL LIGATION       WISDOM TOOTH EXTRACTION              Short Social History:  Social History         Tobacco Use   Smoking status: Never    Smokeless tobacco: Never  Substance Use Topics   Alcohol use: Yes      Comment: socially      Allergies       Allergies  Allergen Reactions   Metronidazole Nausea And Vomiting      Other Reaction(s): vomiting   metronidazole   Penicillin G Rash   Penicillins Other (See Comments) and Rash      Has patient had a PCN reaction causing immediate rash, facial/tongue/throat swelling, SOB or lightheadedness with hypotension: Yes   Has patient had a PCN reaction causing severe rash involving mucus membranes or skin necrosis: No   Has patient had a PCN reaction that required hospitalization No   Has patient had a PCN reaction occurring within the last 10 years: No   If all of the above answers are "NO", then may proceed with Cephalosporin use.              Current Outpatient Medications  Medication Sig Dispense Refill   albuterol (VENTOLIN HFA) 108 (90 Base) MCG/ACT inhaler Inhale 2 puffs into the lungs every 6 (six) hours as needed for wheezing  or shortness of breath. 6.7 g 0   atorvastatin (LIPITOR) 20 MG tablet Take 1 tablet (20 mg total) by mouth daily. 90 tablet 1   Betamethasone Diprop-Minoxidil 0.05-7 % SOLN 1 application every morning 60 g 4   Betamethasone Diprop-Minoxidil 0.05-7 % SOLN Apply 1 Application topically in the morning. 60 g 2   betamethasone dipropionate 0.05 % cream APPLY TO THE HANDS (RASH) TWICE DAILY AS NEEDED FOR TWO WEEKS THEN STOP 45 g 3   Boric Acid Vaginal (AZO BORIC ACID) 600 MG SUPP Compounded Boric Acid 600mg  vaginal capsules  Boric Acid 600mg  vaginal capsules.  DO NOT TAKE ORALLY/TOXIC  insert 1 suppository weekly x 6 weeks; then use prn       celecoxib (CELEBREX) 200 MG capsule Take 1 capsule (200 mg total) by mouth daily with food as needed for pain. 90 capsule 1   clobetasol cream (TEMOVATE) 0.05 % Apply topically 2 (two) times daily. Apply to affected areas of hands twice daily x 2 weeks. 60 g 1   clotrimazole-betamethasone (LOTRISONE) cream  APPLY A THIN COAT DAILY AS NEEDED 45 g 2   Crisaborole (EUCRISA) 2 % OINT Use 1 (one) Application two times daily 60 g 1   cyclobenzaprine (FLEXERIL) 10 MG tablet Take 1 tablet (10 mg total) by mouth 3 (three) times daily as needed for muscle spasms. 15 tablet 0   hydrOXYzine (ATARAX) 10 MG tablet Take 1 tablet (10 mg total) by mouth at bedtime. 30 tablet 0   latanoprost (XALATAN) 0.005 % ophthalmic solution Place 1 drop into both eyes at bedtime 7.5 mL 3   linaclotide (LINZESS) 145 MCG CAPS capsule Take 1 capsule (145 mcg total) by mouth daily. Before the first meal of the day on an empty stomach. 30 capsule 6   mirtazapine (REMERON) 15 MG tablet Take 1 tablet (15 mg total) by mouth at bedtime. 30 tablet 0   naloxone (NARCAN) nasal spray 4 mg/0.1 mL Use 1 spray every 3 minutes. Spray 1 dose into ONE nostril; alternate nostrils w each dose until help arrives 2 each 1   naloxone (NARCAN) nasal spray 4 mg/0.1 mL 1 spray every 3 minutes; spray 1 dose into ONE nostril; alternate nostrils w each dose until help arrives 2 each 1   naloxone (NARCAN) nasal spray 4 mg/0.1 mL 1 spray every 3 minutes; spray 1 dose into ONE nostril; alternate nostrils with each dose until help arrives 2 each 1   ondansetron (ZOFRAN) 4 MG tablet Take 2 tablets (8 mg total) by mouth 2 to 3 times per day as needed for nausea and vomiting 42 tablet 0   oxyCODONE-acetaminophen (PERCOCET) 10-325 MG tablet Take 1 (one) tablet by mouth four times daily, as needed 120 tablet 0   pantoprazole (PROTONIX) 40 MG tablet Take 1 tablet (40 mg total) by mouth daily. 30 tablet 2   phentermine (ADIPEX-P) 37.5 MG tablet Take 1 tablet (37.5 mg) by mouth daily before breakfast. 30 tablet 2   predniSONE (DELTASONE) 20 MG tablet Take 3 tablets (60 mg total) by mouth daily with breakfast. 30 tablet 0   Vitamin D, Ergocalciferol, (DRISDOL) 1.25 MG (50000 UNIT) CAPS capsule Take 1 capsule (50,000 Units total) by mouth once a week. 12 capsule 2      No  current facility-administered medications for this visit.        Review of Systems  Constitutional:  Constitutional negative. HENT:       Alopecia Eyes: Eyes negative.  Respiratory: Respiratory negative.  Cardiovascular: Cardiovascular negative.  GI: Gastrointestinal negative.  Musculoskeletal: Musculoskeletal negative.  Skin: Skin negative.  Neurological: Positive for headaches.  Hematologic: Hematologic/lymphatic negative.  Psychiatric: Psychiatric negative.          Objective:    Objective[] Expand by Default    Vitals:    06/14/23 0837  BP: (!) 158/78  Pulse: 78  Resp: 20  Temp: 98 F (36.7 C)  SpO2: 98%  Weight: 254 lb (115.2 kg)  Height: 5\' 2"  (1.575 m)    Body mass index is 46.46 kg/m.   Physical Exam HENT:     Head: Normocephalic.     Nose: Nose normal.  Eyes:     Pupils: Pupils are equal, round, and reactive to light.  Cardiovascular:     Rate and Rhythm: Normal rate.  Pulmonary:     Effort: Pulmonary effort is normal.  Abdominal:     General: Abdomen is flat.  Musculoskeletal:        General: Normal range of motion.     Cervical back: Normal range of motion and neck supple.  Skin:    General: Skin is warm.     Capillary Refill: Capillary refill takes less than 2 seconds.  Neurological:     General: No focal deficit present.     Mental Status: She is alert.  Psychiatric:        Mood and Affect: Mood normal.        Thought Content: Thought content normal.        Judgment: Judgment normal.        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  I discussed with her the nature of giant cell arteritis with concern for visual loss without steroid treatment she demonstrates very good understanding of this.  I also discussed that biopsy of the temporal artery may be skewed by steroid use but the  utility of temporal artery biopsy would be to give [place] a diagnosis to direct therapy going forward.  We discussed the risk and benefits of proceeding and she demonstrates good understanding we will get this scheduled in the near future.       Maeola Harman MD [facility]

## 2023-06-16 NOTE — Anesthesia Procedure Notes (Signed)
Procedure Name: Intubation Date/Time: 06/16/2023 7:57 AM  Performed by: Colbert Coyer, CRNAPre-anesthesia Checklist: Patient identified, Emergency Drugs available, Suction available and Patient being monitored Patient Re-evaluated:Patient Re-evaluated prior to induction Oxygen Delivery Method: Circle System Utilized Preoxygenation: Pre-oxygenation with 100% oxygen Induction Type: IV induction Ventilation: Mask ventilation without difficulty Laryngoscope Size: Mac and 4 Grade View: Grade I Tube type: Oral Tube size: 7.0 mm Number of attempts: 1 Airway Equipment and Method: Stylet Placement Confirmation: ETT inserted through vocal cords under direct vision, positive ETCO2 and breath sounds checked- equal and bilateral Secured at: 22 cm Tube secured with: Tape Dental Injury: Teeth and Oropharynx as per pre-operative assessment

## 2023-06-16 NOTE — Anesthesia Preprocedure Evaluation (Addendum)
Anesthesia Evaluation  Patient identified by MRN, date of birth, ID band Patient awake    Reviewed: Allergy & Precautions, NPO status , Patient's Chart, lab work & pertinent test results  Airway Mallampati: II  TM Distance: >3 FB Neck ROM: Full    Dental no notable dental hx.    Pulmonary    Pulmonary exam normal        Cardiovascular negative cardio ROS Normal cardiovascular exam     Neuro/Psych negative neurological ROS  negative psych ROS   GI/Hepatic negative GI ROS, Neg liver ROS,,,  Endo/Other    Class 3 obesityPatient on GLP-1 Agonist. Last dose on 06/12/23  Renal/GU Renal disease     Musculoskeletal  (+) Arthritis ,    Abdominal  (+) + obese  Peds  Hematology negative hematology ROS (+)   Anesthesia Other Findings TEMPORAL ARTERITIS  Reproductive/Obstetrics                             Anesthesia Physical Anesthesia Plan  ASA: 3  Anesthesia Plan: General   Post-op Pain Management:    Induction: Intravenous  PONV Risk Score and Plan: 3 and Ondansetron, Dexamethasone, Midazolam and Treatment may vary due to age or medical condition  Airway Management Planned: Oral ETT  Additional Equipment:   Intra-op Plan:   Post-operative Plan: Extubation in OR  Informed Consent: I have reviewed the patients History and Physical, chart, labs and discussed the procedure including the risks, benefits and alternatives for the proposed anesthesia with the patient or authorized representative who has indicated his/her understanding and acceptance.     Dental advisory given  Plan Discussed with: CRNA  Anesthesia Plan Comments:        Anesthesia Quick Evaluation

## 2023-06-16 NOTE — Op Note (Addendum)
    NAME: Sandra Bird    MRN: [id number] DOB: 05/27/1970    DATE OF OPERATION: 06/16/2023  PREOP DIAGNOSIS:    Concern for temporal arteritis  POSTOP DIAGNOSIS:    Same  PROCEDURE:    Left temporal artery biopsy  SURGEON: Victorino Sparrow  ASSIST: None  ANESTHESIA: General  EBL: 5 mL  INDICATIONS:    XEE HOLLMAN is a 53 y.o. female with history of left-sided headache with elevated ESR.  There is concern for temporal arteritis, therefore she was sent to our office for left-sided temporal artery biopsy.  After discussing risk-benefit of surgery, and he elected to proceed.  FINDINGS:   Left temporal artery identified, pulsating, 4 cm segment removed.  TECHNIQUE:   Patient was brought to the OR laid in supine position.  General anesthesia was induced and the patient was prepped draped in standard fashion.  The case began with ultrasound insonation of the left temporal area.  The temporal artery was identified, and a longitudinal incision was made over the artery immediately anterior to the tragus.  This was carried down to the artery.  The artery was seen pulsating in the wound bed and tracked back to enlarge artery again pulsating.  The artery was clamped, cut, and then backbled with pulsating blood appreciated from the proximal aspect.  This was clipped.  I was able to get more proximal to ensure that the vessel was in fact artery and vein.  There was significant spasm, however there was still pulsatility appreciated.  Another segment of the artery was transected with a clip proximal.  Samples were sent to pathology.  The wound bed was irrigated with copious amounts of saline and closed using 3-0 Vicryl suture with Monocryl and Dermabond at the level of the skin.    Victorino Sparrow, MD [facility] DATE OF DICTATION:   06/16/2023

## 2023-06-16 NOTE — Transfer of Care (Signed)
Immediate Anesthesia Transfer of Care Note  Patient: Sandra Bird  Procedure(s) Performed: BIOPSY TEMPORAL ARTERY (Left)  Patient Location: PACU  Anesthesia Type:General  Level of Consciousness: awake and drowsy  Airway & Oxygen Therapy: Patient Spontanous Breathing and Patient connected to nasal cannula oxygen  Post-op Assessment: Report given to RN and Post -op Vital signs reviewed and stable  Post vital signs: Reviewed and stable  Last Vitals:  Vitals Value Taken Time  BP 146/86 06/16/23 0915  Temp 99   Pulse 96 06/16/23 0916  Resp 16 06/16/23 0916  SpO2 100 % 06/16/23 0916  Vitals shown include unfiled device data.  Last Pain:  Vitals:   06/16/23 4098  TempSrc:   PainSc: 0-No pain         Complications: No notable events documented.

## 2023-06-17 NOTE — Anesthesia Postprocedure Evaluation (Signed)
 Anesthesia Post Note  Patient: Sandra Bird  Procedure(s) Performed: BIOPSY TEMPORAL ARTERY (Left)     Patient location during evaluation: PACU Anesthesia Type: General Level of consciousness: awake Pain management: pain level controlled Vital Signs Assessment: post-procedure vital signs reviewed and stable Respiratory status: spontaneous breathing, nonlabored ventilation and respiratory function stable Cardiovascular status: blood pressure returned to baseline and stable Postop Assessment: no apparent nausea or vomiting Anesthetic complications: no   No notable events documented.  Last Vitals:  Vitals:   06/16/23 0945 06/16/23 1000  BP: (!) 142/91 135/88  Pulse: 76 72  Resp: 13 13  Temp:  37.1 C  SpO2: 100% 98%    Last Pain:  Vitals:   06/16/23 1000  TempSrc:   PainSc: 4                  [name] P Jackquelyn Sundberg

## 2023-06-18 ENCOUNTER — Encounter (HOSPITAL_COMMUNITY): Payer: Self-pay | Admitting: Vascular Surgery

## 2023-06-19 LAB — SURGICAL PATHOLOGY

## 2023-06-21 ENCOUNTER — Ambulatory Visit (INDEPENDENT_AMBULATORY_CARE_PROVIDER_SITE_OTHER): Payer: Medicare PPO

## 2023-06-21 VITALS — Ht 63.0 in | Wt 250.0 lb

## 2023-06-21 DIAGNOSIS — Z0000 Encounter for general adult medical examination without abnormal findings: Secondary | ICD-10-CM

## 2023-06-21 NOTE — Patient Instructions (Signed)
 Ms. Brumbaugh , Thank you for taking time to come for your Medicare Wellness Visit. I appreciate your ongoing commitment to your health goals. Please review the following plan we discussed and let me know if I can assist you in the future.   Referrals/Orders/Follow-Ups/Clinician Recommendations: Aim for 30 minutes of exercise or brisk walking, 6-8 glasses of water, and 5 servings of fruits and vegetables each day. Each day, aim for 6 glasses of water, plenty of protein in your diet and try to get up and walk/ stretch every hour for 5-10 minutes at a time.    This is a list of the screening recommended for you and due dates:  Health Maintenance  Topic Date Due   Flu Shot  07/24/2023*   DTaP/Tdap/Td vaccine (1 - Tdap) 08/01/2023*   Zoster (Shingles) Vaccine (1 of 2) 09/04/2023*   Medicare Annual Wellness Visit  06/20/2024   Mammogram  05/10/2025   Pap with HPV screening  09/27/2025   Colon Cancer Screening  01/21/2030   Hepatitis C Screening  Completed   HIV Screening  Completed   HPV Vaccine  Aged Out   COVID-19 Vaccine  Discontinued  *Topic was postponed. The date shown is not the original due date.    Advanced directives: (Declined) Advance directive discussed with you today. Even though you declined this today, please call our office should you change your mind, and we can give you the proper paperwork for you to fill out.  Next Medicare Annual Wellness Visit scheduled for next year: Yes  Vaccinations: declines all Influenza vaccine: recommend every Fall Pneumococcal vaccine: recommend once per lifetime Prevnar-20 Tdap vaccine: recommend every 10 years Shingles vaccine: recommend Shingrix which is 2 doses 2-6 months apart and over 90% effective     Covid-19: recommend 2 doses one month apart with a booster 6 months later

## 2023-06-21 NOTE — Progress Notes (Addendum)
 Subjective:   Sandra Bird is a 53 y.o. who presents for a Medicare Wellness preventive visit.  Visit Complete: Virtual I connected with  [name] on 06/21/23 by a audio enabled telemedicine application and verified that I am speaking with the correct person using two identifiers.  Patient Location: Home  Provider Location: Home Office  I discussed the limitations of evaluation and management by telemedicine. The patient expressed understanding and agreed to proceed.  Vital Signs: Because this visit was a virtual/telehealth visit, some criteria may be missing or patient reported. Any vitals not documented were not able to be obtained and vitals that have been documented are patient reported.    AWV Questionnaire: No: Patient Medicare AWV questionnaire was not completed prior to this visit.  Cardiac Risk Factors include: advanced age (>7men, >69 women);dyslipidemia;obesity (BMI >30kg/m2)     Objective:    Today's Vitals   06/21/23 0812  Weight: 250 lb (113.4 kg)  Height: 5\' 3"  (1.6 m)   Body mass index is 44.29 kg/m.     06/21/2023    8:22 AM 12/01/2021    1:55 PM 03/24/2020   10:00 PM 03/16/2020   10:47 AM 06/08/2015    9:26 AM 12/01/2014    7:27 PM 06/15/2014    8:39 AM  Advanced Directives  Does Patient Have a Medical Advance Directive? No No No No No No No  Would patient like information on creating a medical advance directive? No - Patient declined No - Patient declined No - Patient declined No - Patient declined No - patient declined information  No - patient declined information    Current Medications (verified) Outpatient Encounter Medications as of 06/21/2023  Medication Sig   albuterol (VENTOLIN HFA) 108 (90 Base) MCG/ACT inhaler Inhale 2 puffs into the lungs every 6 (six) hours as needed for wheezing or shortness of breath.   atorvastatin (LIPITOR) 20 MG tablet Take 1 tablet (20 mg total) by mouth daily.   betamethasone dipropionate 0.05 % cream  APPLY TO THE HANDS (RASH) TWICE DAILY AS NEEDED FOR TWO WEEKS THEN STOP   Boric Acid Vaginal (AZO BORIC ACID) 600 MG SUPP Place 600 mg vaginally once a week. As needed hormones   celecoxib (CELEBREX) 200 MG capsule Take 1 capsule (200 mg total) by mouth daily with food as needed for pain. (Patient taking differently: Take 200 mg by mouth daily as needed for mild pain (pain score 1-3).)   clobetasol cream (TEMOVATE) 0.05 % Apply topically 2 (two) times daily. Apply to affected areas of hands twice daily x 2 weeks. (Patient taking differently: Apply 1 Application topically daily as needed (Rash on hands).)   clotrimazole-betamethasone (LOTRISONE) cream APPLY A THIN COAT DAILY AS NEEDED (Patient taking differently: Apply 1 Application topically daily as needed (Rash on hands).)   cyclobenzaprine (FLEXERIL) 10 MG tablet Take 1 tablet (10 mg total) by mouth 3 (three) times daily as needed for muscle spasms.   hydrOXYzine (ATARAX) 10 MG tablet Take 1 tablet (10 mg total) by mouth at bedtime. (Patient taking differently: Take 10 mg by mouth at bedtime as needed (Sleep).)   latanoprost (XALATAN) 0.005 % ophthalmic solution Place 1 drop into both eyes at bedtime   linaclotide (LINZESS) 145 MCG CAPS capsule Take 1 capsule (145 mcg total) by mouth daily. Before the first meal of the day on an empty stomach.   mirtazapine (REMERON) 15 MG tablet Take 1 tablet (15 mg total) by mouth at bedtime.   Multiple  Vitamins-Minerals (MULTIVITAMIN WITH MINERALS) tablet Take 1 tablet by mouth daily.   ondansetron (ZOFRAN) 4 MG tablet Take 2 tablets (8 mg total) by mouth 2 to 3 times per day as needed for nausea and vomiting   oxyCODONE-acetaminophen (PERCOCET) 10-325 MG tablet Take 1 (one) tablet by mouth four times daily, as needed (Patient taking differently: Take 1 tablet by mouth 3 (three) times daily.)   pantoprazole (PROTONIX) 40 MG tablet Take 1 tablet (40 mg total) by mouth daily.   predniSONE (DELTASONE) 20 MG tablet  Take 3 tablets (60 mg total) by mouth daily with breakfast. (Patient taking differently: Take 20 mg by mouth 3 (three) times daily.)   Semaglutide, 2 MG/DOSE, (OZEMPIC, 2 MG/DOSE,) 8 MG/3ML SOPN Inject 2 mg into the skin once a week.   Vitamin D, Ergocalciferol, (DRISDOL) 1.25 MG (50000 UNIT) CAPS capsule Take 1 capsule (50,000 Units total) by mouth once a week.   naloxone (NARCAN) nasal spray 4 mg/0.1 mL 1 spray every 3 minutes; spray 1 dose into ONE nostril; alternate nostrils with each dose until help arrives (Patient not taking: Reported on 06/21/2023)   [DISCONTINUED] phentermine (ADIPEX-P) 37.5 MG tablet Take 1 tablet (37.5 mg) by mouth daily before breakfast. (Patient not taking: Reported on 06/14/2023)   No facility-administered encounter medications on file as of 06/21/2023.    Allergies (verified) Metronidazole, Penicillin g, and Penicillins   History: Past Medical History:  Diagnosis Date   Arthritis    SVD (spontaneous vaginal delivery)    x 3   Past Surgical History:  Procedure Laterality Date   ABDOMINOPLASTY/PANNICULECTOMY     ARTERY BIOPSY Left 06/16/2023   Procedure: BIOPSY TEMPORAL ARTERY;  Surgeon: Victorino Sparrow, MD;  Location: [facility] OR;  Service: Vascular;  Laterality: Left;   CHOLECYSTECTOMY     DILITATION & CURRETTAGE/HYSTROSCOPY WITH NOVASURE ABLATION N/A 07/25/2013   Procedure: DILATATION & CURETTAGE/HYSTEROSCOPY WITH NOVASURE ABLATION;  Surgeon: Miguel Aschoff, MD;  Location: [facility];  Service: Gynecology;  Laterality: N/A;   TONSILLECTOMY     TOTAL HIP ARTHROPLASTY Right 03/24/2020   Procedure: RIGHT TOTAL HIP ARTHROPLASTY ANTERIOR APPROACH;  Surgeon: Marcene Corning, MD;  Location: [facility];  Service: Orthopedics;  Laterality: Right;   TUBAL LIGATION     WISDOM TOOTH EXTRACTION     Family History  Problem Relation Age of Onset   Diabetes Mother    Heart failure Mother    Heart attack Father 28   Social History   Socioeconomic History   Marital status: Single     Spouse name: Not on file   Number of children: 3   Years of education: Not on file   Highest education level: Not on file  Occupational History   Not on file  Tobacco Use   Smoking status: Never   Smokeless tobacco: Never  Vaping Use   Vaping status: Never Used  Substance and Sexual Activity   Alcohol use: Yes    Comment: socially   Drug use: No   Sexual activity: Yes    Birth control/protection: Surgical  Other Topics Concern   Not on file  Social History Narrative   Retired-teacher.  Disabled from hip   Social Drivers of Health   Financial Resource Strain: Low Risk  (06/21/2023)   Overall Financial Resource Strain (CARDIA)    Difficulty of Paying Living Expenses: Not hard at all  Food Insecurity: No Food Insecurity (06/21/2023)   Hunger Vital Sign    Worried About Running Out of Food in the  Last Year: Never true    Ran Out of Food in the Last Year: Never true  Transportation Needs: No Transportation Needs (06/21/2023)   PRAPARE - Administrator, Civil Service (Medical): No    Lack of Transportation (Non-Medical): No  Physical Activity: Insufficiently Active (06/21/2023)   Exercise Vital Sign    Days of Exercise per Week: 2 days    Minutes of Exercise per Session: 30 min  Stress: Stress Concern Present (06/21/2023)   [employer] of Occupational Health - Occupational Stress Questionnaire    Feeling of Stress : Rather much  Social Connections: Moderately Isolated (06/21/2023)   Social Connection and Isolation Panel [NHANES]    Frequency of Communication with Friends and Family: Three times a week    Frequency of Social Gatherings with Friends and Family: Once a week    Attends Religious Services: More than 4 times per year    Active Member of [employer] or Organizations: No    Attends Engineer, structural: Never    Marital Status: Never married    Tobacco Counseling Counseling given: Not Answered    Clinical Intake:  Pre-visit preparation  completed: Yes  Pain : No/denies pain     BMI - recorded: 44.29 Nutritional Status: BMI > 30  Obese Nutritional Risks: None Diabetes: No  How often do you need to have someone help you when you read instructions, pamphlets, or other written materials from your doctor or pharmacy?: 1 - Never  Interpreter Needed?: No  Information entered by :: Lanier Ensign, LPN   Activities of Daily Living     06/21/2023    8:13 AM 06/16/2023    6:34 AM  In your present state of health, do you have any difficulty performing the following activities:  Hearing? 0   Vision? 0   Difficulty concentrating or making decisions? 0   Walking or climbing stairs? 1   Dressing or bathing? 0   Doing errands, shopping? 0 0  Preparing Food and eating ? N   Using the Toilet? N   In the past six months, have you accidently leaked urine? N   Do you have problems with loss of bowel control? N   Managing your Medications? N   Managing your Finances? N   Housekeeping or managing your Housekeeping? N     Patient Care Team: Jeani Sow, MD as PCP - General (Family Medicine)  Indicate any recent Medical Services you may have received from other than [facility] providers in the past year (date may be approximate).     Assessment:   This is a routine wellness examination for [place].  Hearing/Vision screen Hearing Screening - Comments:: Pt denies any hearing issues  Vision Screening - Comments:: Pt follows up with eye associates in [place] for annual eye exams    Goals Addressed             This Visit's Progress    Patient Stated       Eat healthier and loss weight        Depression Screen     06/21/2023    8:17 AM 01/04/2023   10:19 AM 06/01/2022   10:33 AM 12/14/2021   10:59 AM  PHQ 2/9 Scores  PHQ - 2 Score 4 4 3  0  PHQ- 9 Score 16 20 18 8     Fall Risk     06/21/2023    8:26 AM 01/04/2023   10:18 AM 06/01/2022   10:33 AM 03/03/2022  10:12 AM  Fall Risk   Falls in the past year? 0 0  0 0  Number falls in past yr: 0 0 0 0  Injury with Fall? 0 0 0 0  Risk for fall due to : Impaired balance/gait;Impaired mobility No Fall Risks No Fall Risks No Fall Risks  Follow up Falls prevention discussed Falls evaluation completed Falls evaluation completed Falls evaluation completed    MEDICARE RISK AT HOME:  Medicare Risk at Home Any stairs in or around the home?: Yes If so, are there any without handrails?: No Home free of loose throw rugs in walkways, pet beds, electrical cords, etc?: Yes Adequate lighting in your home to reduce risk of falls?: Yes Life alert?: No Use of a cane, walker or w/c?: Yes Grab bars in the bathroom?: No Shower chair or bench in shower?: No Elevated toilet seat or a handicapped toilet?: No  TIMED UP AND GO:  Was the test performed?  No  Cognitive Function: 6CIT completed        06/21/2023    8:26 AM  6CIT Screen  What Year? 0 points  What month? 0 points  What time? 0 points  Count back from 20 0 points  Months in reverse 0 points  Repeat phrase 0 points  Total Score 0 points    Immunizations Immunization History  Administered Date(s) Administered   Moderna Sars-Covid-2 Vaccination 06/20/2019, 06/27/2019    Screening Tests Health Maintenance  Topic Date Due   INFLUENZA VACCINE  07/24/2023 (Originally 11/24/2022)   DTaP/Tdap/Td (1 - Tdap) 08/01/2023 (Originally 02/22/1988)   Zoster Vaccines- Shingrix (1 of 2) 09/04/2023 (Originally 02/22/2019)   Medicare Annual Wellness (AWV)  06/20/2024   MAMMOGRAM  05/10/2025   Cervical Cancer Screening (HPV/Pap Cotest)  09/27/2025   Colonoscopy  01/21/2030   Hepatitis C Screening  Completed   HIV Screening  Completed   HPV VACCINES  Aged Out   COVID-19 Vaccine  Discontinued    Health Maintenance  There are no preventive care reminders to display for this patient.  Health Maintenance Items Addressed:   Additional Screening:  Vision Screening: Recommended annual ophthalmology exams  for early detection of glaucoma and other disorders of the eye.  Dental Screening: Recommended annual dental exams for proper oral hygiene  Community Resource Referral / Chronic Care Management: CRR required this visit?  No   CCM required this visit?  PCP informed of CCM need     Plan:     I have personally reviewed and noted the following in the patient's chart:   Medical and social history Use of alcohol, tobacco or illicit drugs  Current medications and supplements including opioid prescriptions. Patient is currently taking opioid prescriptions. Information provided to patient regarding non-opioid alternatives. Patient advised to discuss non-opioid treatment plan with their provider. Functional ability and status Nutritional status Physical activity Advanced directives List of other physicians Hospitalizations, surgeries, and ER visits in previous 12 months Vitals Screenings to include cognitive, depression, and falls Referrals and appointments  In addition, I have reviewed and discussed with patient certain preventive protocols, quality metrics, and best practice recommendations. A written personalized care plan for preventive services as well as general preventive health recommendations were provided to patient.     Marzella Schlein, LPN   9/81/1914   After Visit Summary: (MyChart) Due to this being a telephonic visit, the after visit summary with patients personalized plan was offered to patient via MyChart   Notes: Please refer to Routing Comments.

## 2023-06-23 ENCOUNTER — Other Ambulatory Visit (HOSPITAL_COMMUNITY): Payer: Self-pay

## 2023-06-23 ENCOUNTER — Encounter: Payer: Self-pay | Admitting: Family Medicine

## 2023-06-23 ENCOUNTER — Ambulatory Visit: Payer: Medicare PPO | Admitting: Family Medicine

## 2023-06-23 VITALS — BP 112/70 | HR 90 | Temp 98.2°F | Resp 18 | Ht 63.0 in | Wt 254.0 lb

## 2023-06-23 DIAGNOSIS — R700 Elevated erythrocyte sedimentation rate: Secondary | ICD-10-CM

## 2023-06-23 DIAGNOSIS — F5101 Primary insomnia: Secondary | ICD-10-CM

## 2023-06-23 DIAGNOSIS — K219 Gastro-esophageal reflux disease without esophagitis: Secondary | ICD-10-CM | POA: Diagnosis not present

## 2023-06-23 DIAGNOSIS — M2550 Pain in unspecified joint: Secondary | ICD-10-CM | POA: Diagnosis not present

## 2023-06-23 MED ORDER — PANTOPRAZOLE SODIUM 40 MG PO TBEC
40.0000 mg | DELAYED_RELEASE_TABLET | Freq: Every day | ORAL | 1 refills | Status: DC
  Filled 2023-06-23 – 2023-07-18 (×2): qty 90, 90d supply, fill #0
  Filled 2023-11-29: qty 90, 90d supply, fill #1

## 2023-06-23 MED ORDER — CYCLOBENZAPRINE HCL 10 MG PO TABS
10.0000 mg | ORAL_TABLET | Freq: Every evening | ORAL | 1 refills | Status: DC | PRN
  Filled 2023-06-23: qty 90, 90d supply, fill #0

## 2023-06-23 MED ORDER — MIRTAZAPINE 15 MG PO TABS
15.0000 mg | ORAL_TABLET | Freq: Every day | ORAL | 2 refills | Status: DC
  Filled 2023-06-23 – 2023-07-18 (×2): qty 30, 30d supply, fill #0

## 2023-06-23 NOTE — Patient Instructions (Signed)
 Celebrex daily for 1-2 weeks and see if helps pain Cyclobenzaprine-try 1/2 at bed to see if works

## 2023-06-23 NOTE — Progress Notes (Signed)
 Subjective:     Patient ID: Sandra Bird, female    DOB: 12/09/1970, 53 y.o.   MRN: [id number]  Chief Complaint  Patient presents with   Follow-up    1 week follow-up, discuss biopsy     HPI Discussed the use of AI scribe software for clinical note transcription with the patient, who gave verbal consent to proceed.  History of Present Illness   Sandra Bird is a 53 year old female who presents with dizziness and lightheadedness.  She experiences dizziness and lightheadedness that can occur at any time, including while sitting or standing. These episodes last about five to ten minutes and are sometimes alleviated by drinking water or a mixture of vinegar and water. She sometimes avoids driving due to these symptoms.  She has a history of polyarthritis diagnosed years ago and has been experiencing aches and pains. She was previously on prednisone for new HA and elevated sed rate-needed to consider GCA-ruled out w/bx, which significantly alleviated her symptoms, including headaches and dizziness. However, she discontinued prednisone as GCA ruled out. She is taking oxycodone for pain management and has been advised to take Celebrex daily for pain relief. She also uses a muscle relaxer at night to aid sleep.  She is currently menopausal and wonders if her symptoms are related to hormonal changes.  She is on mirtazapine, which helps her sleep, and she takes a half dose. It does not significantly improve her mood, although she was previously feeling depressed.  She is on pantoprazole for reflux, which has been effective in managing her symptoms.  She is using Ozempic for weight management, although she has faced challenges with insurance coverage for Zepbound. She is paying for Ozempic out-of-pocket.       There are no preventive care reminders to display for this patient.  Past Medical History:  Diagnosis Date   Arthritis    SVD (spontaneous vaginal delivery)    x 3     Past Surgical History:  Procedure Laterality Date   ABDOMINOPLASTY/PANNICULECTOMY     ARTERY BIOPSY Left 06/16/2023   Procedure: BIOPSY TEMPORAL ARTERY;  Surgeon: Victorino Sparrow, MD;  Location[facility] OR;  Service: Vascular;  Laterality: Left;   CHOLECYSTECTOMY     DILITATION & CURRETTAGE/HYSTROSCOPY WITH NOVASURE ABLATION N/A 07/25/2013   Procedure: DILATATION & CURETTAGE/HYSTEROSCOPY WITH NOVASURE ABLATION;  Surgeon: Miguel Aschoff, MD;  Location: [facility];  Service: Gynecology;  Laterality: N/A;   TONSILLECTOMY     TOTAL HIP ARTHROPLASTY Right 03/24/2020   Procedure: RIGHT TOTAL HIP ARTHROPLASTY ANTERIOR APPROACH;  Surgeon: Marcene Corning, MD;  Location: [facility];  Service: Orthopedics;  Laterality: Right;   TUBAL LIGATION     WISDOM TOOTH EXTRACTION       Current Outpatient Medications:    albuterol (VENTOLIN HFA) 108 (90 Base) MCG/ACT inhaler, Inhale 2 puffs into the lungs every 6 (six) hours as needed for wheezing or shortness of breath., Disp: 6.7 g, Rfl: 0   atorvastatin (LIPITOR) 20 MG tablet, Take 1 tablet (20 mg total) by mouth daily., Disp: 90 tablet, Rfl: 1   betamethasone dipropionate 0.05 % cream, APPLY TO THE HANDS (RASH) TWICE DAILY AS NEEDED FOR TWO WEEKS THEN STOP, Disp: 45 g, Rfl: 3   Boric Acid Vaginal (AZO BORIC ACID) 600 MG SUPP, Place 600 mg vaginally once a week. As needed hormones, Disp: , Rfl:    celecoxib (CELEBREX) 200 MG capsule, Take 1 capsule (200 mg total) by mouth daily with food as  needed for pain. (Patient taking differently: Take 200 mg by mouth daily as needed for mild pain (pain score 1-3).), Disp: 90 capsule, Rfl: 1   clobetasol cream (TEMOVATE) 0.05 %, Apply topically 2 (two) times daily. Apply to affected areas of hands twice daily x 2 weeks. (Patient taking differently: Apply 1 Application topically daily as needed (Rash on hands).), Disp: 60 g, Rfl: 1   clotrimazole-betamethasone (LOTRISONE) cream, APPLY A THIN COAT DAILY AS NEEDED (Patient taking  differently: Apply 1 Application topically daily as needed (Rash on hands).), Disp: 45 g, Rfl: 2   cyclobenzaprine (FLEXERIL) 10 MG tablet, Take 1 tablet (10 mg total) by mouth at bedtime as needed for muscle spasms., Disp: 90 tablet, Rfl: 1   hydrOXYzine (ATARAX) 10 MG tablet, Take 1 tablet (10 mg total) by mouth at bedtime. (Patient taking differently: Take 10 mg by mouth at bedtime as needed (Sleep).), Disp: 30 tablet, Rfl: 0   latanoprost (XALATAN) 0.005 % ophthalmic solution, Place 1 drop into both eyes at bedtime, Disp: 7.5 mL, Rfl: 3   linaclotide (LINZESS) 145 MCG CAPS capsule, Take 1 capsule (145 mcg total) by mouth daily. Before the first meal of the day on an empty stomach., Disp: 30 capsule, Rfl: 6   mirtazapine (REMERON) 15 MG tablet, Take 1 tablet (15 mg total) by mouth at bedtime., Disp: 30 tablet, Rfl: 2   Multiple Vitamins-Minerals (MULTIVITAMIN WITH MINERALS) tablet, Take 1 tablet by mouth daily., Disp: , Rfl:    naloxone (NARCAN) nasal spray 4 mg/0.1 mL, 1 spray every 3 minutes; spray 1 dose into ONE nostril; alternate nostrils with each dose until help arrives (Patient not taking: Reported on 06/21/2023), Disp: 2 each, Rfl: 1   ondansetron (ZOFRAN) 4 MG tablet, Take 2 tablets (8 mg total) by mouth 2 to 3 times per day as needed for nausea and vomiting, Disp: 42 tablet, Rfl: 0   oxyCODONE-acetaminophen (PERCOCET) 10-325 MG tablet, Take 1 (one) tablet by mouth four times daily, as needed (Patient taking differently: Take 1 tablet by mouth 3 (three) times daily.), Disp: 120 tablet, Rfl: 0   pantoprazole (PROTONIX) 40 MG tablet, Take 1 tablet (40 mg total) by mouth daily., Disp: 90 tablet, Rfl: 1   Semaglutide, 2 MG/DOSE, (OZEMPIC, 2 MG/DOSE,) 8 MG/3ML SOPN, Inject 2 mg into the skin once a week., Disp: , Rfl:    Vitamin D, Ergocalciferol, (DRISDOL) 1.25 MG (50000 UNIT) CAPS capsule, Take 1 capsule (50,000 Units total) by mouth once a week., Disp: 12 capsule, Rfl: 2  Allergies  Allergen  Reactions   Metronidazole Nausea And Vomiting    Other Reaction(s): vomiting  Metronidazole Patient stated no issues with this med    Penicillin G Rash   Penicillins Other (See Comments) and Rash    Has patient had a PCN reaction causing immediate rash, facial/tongue/throat swelling, SOB or lightheadedness with hypotension: Yes  Has patient had a PCN reaction causing severe rash involving mucus membranes or skin necrosis: No  Has patient had a PCN reaction that required hospitalization No  Has patient had a PCN reaction occurring within the last 10 years: No  If all of the above answers are "NO", then may proceed with Cephalosporin use.   ROS neg/noncontributory except as noted HPI/below      Objective:     BP 112/70   Pulse 90   Temp 98.2 F (36.8 C) (Temporal)   Resp 18   Ht 5\' 3"  (1.6 m)   Wt 254 lb (115.2  kg)   SpO2 98%   BMI 44.99 kg/m  Wt Readings from Last 3 Encounters:  06/23/23 254 lb (115.2 kg)  06/21/23 250 lb (113.4 kg)  06/16/23 250 lb (113.4 kg)    Physical Exam   Gen: WDWN NAD HEENT: NCAT, conjunctiva not injected, sclera nonicteric NECK:  supple, no thyromegaly, no nodes, no carotid bruits CARDIAC: RRR, S1S2+, no murmur. DP 2+B LUNGS: CTAB. No wheezes EXT:  no edema MSK: no gross abnormalities.  NEURO: A&O x3.  CN II-XII intact.  PSYCH: normal mood. Good eye contact  Review labs     Assessment & Plan:  Polyarthralgia -     Ambulatory referral to Rheumatology -     Cyclobenzaprine HCl; Take 1 tablet (10 mg total) by mouth at bedtime as needed for muscle spasms.  Dispense: 90 tablet; Refill: 1  Elevated sed rate -     Ambulatory referral to Rheumatology  Primary insomnia -     Mirtazapine; Take 1 tablet (15 mg total) by mouth at bedtime.  Dispense: 30 tablet; Refill: 2  Gastroesophageal reflux disease without esophagitis -     Pantoprazole Sodium; Take 1 tablet (40 mg total) by mouth daily.  Dispense: 90 tablet; Refill:  1   Assessment and Plan    Inflammatory Arthritis Presents with polyarthritis and elevated sed rate, indicating ongoing inflammation. Symptoms improved with prednisone, suggesting an inflammatory component. Differential includes rheumatoid arthritis or other inflammatory arthritides. Long-term prednisone use is not advisable due to weight gain and immune suppression. Consider alternative treatments Refer to rheumatologist for further evaluation and management. Prescribe Celebrex daily for 1-2 weeks to assess pain relief. Prescribe cyclobenzaprine, starting with half a tablet at bedtime and adjust as needed. Continue mirtazapine for sleep and mood stabilization.  Chronic Pain Management Managed with oxycodone and muscle relaxers, likely secondary to arthritis. Muscle relaxers aid sleep. Use the lowest effective dose of cyclobenzaprine to minimize side effects. Continue oxycodone as prescribed. Prescribe cyclobenzaprine, starting with half a tablet at bedtime and adjust as needed.  Menopausal Symptoms Reports dizziness and lightheadedness, potentially related to menopause. Symptoms are intermittent and may also be related to medications or blood sugar fluctuations. Monitor symptoms and consider further evaluation if dizziness persists or worsens.  Gastroesophageal Reflux Disease (GERD) Reports improvement in reflux symptoms with pantoprazole. No dysphagia noted. Continue pantoprazole .  Obesity On Ozempic for weight management. Insurance denial for Zepbound noted, appeal in process. Discussed Medicare coverage issues for weight loss drugs. Continue Ozempic as prescribed. Follow up on Zepbound appeal.  Follow-up Cancel March 11th appointment. Schedule follow-up in three months. Advise to report any changes in symptoms.       Return in about 3 months (around 09/20/2023) for chronic follow-up-canc 3/11.  Angelena Sole, MD

## 2023-06-26 ENCOUNTER — Other Ambulatory Visit (HOSPITAL_COMMUNITY): Payer: Self-pay

## 2023-06-26 MED ORDER — PROGESTERONE MICRONIZED 100 MG PO CAPS
100.0000 mg | ORAL_CAPSULE | Freq: Every day | ORAL | 0 refills | Status: DC
  Filled 2023-06-26: qty 90, 90d supply, fill #0

## 2023-06-26 MED ORDER — ESTRADIOL 0.1 MG/24HR TD PTTW
1.0000 | MEDICATED_PATCH | TRANSDERMAL | 0 refills | Status: DC
Start: 2023-06-26 — End: 2024-03-06
  Filled 2023-06-26: qty 24, 84d supply, fill #0

## 2023-06-27 ENCOUNTER — Telehealth: Payer: Self-pay | Admitting: *Deleted

## 2023-06-27 ENCOUNTER — Other Ambulatory Visit (HOSPITAL_COMMUNITY): Payer: Self-pay

## 2023-06-27 MED ORDER — NALOXONE HCL 4 MG/0.1ML NA LIQD
NASAL | 3 refills | Status: AC
  Filled 2023-06-27: qty 2, 30d supply, fill #0

## 2023-06-27 MED ORDER — OXYCODONE-ACETAMINOPHEN 10-325 MG PO TABS
1.0000 | ORAL_TABLET | Freq: Four times a day (QID) | ORAL | 0 refills | Status: DC | PRN
Start: 2023-06-27 — End: 2023-07-25
  Filled 2023-06-27: qty 120, 30d supply, fill #0

## 2023-06-27 MED ORDER — ERGOCALCIFEROL 1.25 MG (50000 UT) PO CAPS
50000.0000 [IU] | ORAL_CAPSULE | ORAL | 1 refills | Status: DC
  Filled 2023-06-27: qty 12, 84d supply, fill #0

## 2023-06-27 NOTE — Telephone Encounter (Signed)
 Left message to return my call. Need more info.  Copied from CRM [id number]. Topic: General - Other >> Jun 27, 2023  8:57 AM Elizebeth Brooking wrote: Reason for CRM: Needs doctor notes sent over fax number is   [phone]

## 2023-06-27 NOTE — Telephone Encounter (Signed)
 Copied from CRM [id number]. Topic: General - Other >> Jun 27, 2023  2:35 PM Fredrich Romans wrote: Reason for CRM: [facility] medical called in stating that they received the referral that was sent over on behalf of patient,however there was no office notes,and they would like to know if they could be sent. Fax#:[phone]  Office notes faxed to [facility] with confirmation of receipt.

## 2023-07-04 ENCOUNTER — Ambulatory Visit: Payer: Medicare PPO | Admitting: Family Medicine

## 2023-07-04 ENCOUNTER — Other Ambulatory Visit (HOSPITAL_COMMUNITY): Payer: Self-pay

## 2023-07-04 ENCOUNTER — Other Ambulatory Visit: Payer: Self-pay | Admitting: *Deleted

## 2023-07-04 ENCOUNTER — Other Ambulatory Visit: Payer: Self-pay | Admitting: Family Medicine

## 2023-07-04 MED ORDER — ZEPBOUND 2.5 MG/0.5ML ~~LOC~~ SOAJ
2.5000 mg | SUBCUTANEOUS | 0 refills | Status: DC
Start: 2023-07-04 — End: 2023-07-24
  Filled 2023-07-04: qty 2, 28d supply, fill #0

## 2023-07-04 MED ORDER — TIRZEPATIDE-WEIGHT MANAGEMENT 2.5 MG/0.5ML ~~LOC~~ SOLN
2.5000 mg | SUBCUTANEOUS | 0 refills | Status: DC
  Filled 2023-07-04: qty 2, 28d supply, fill #0

## 2023-07-05 ENCOUNTER — Other Ambulatory Visit (HOSPITAL_COMMUNITY): Payer: Self-pay

## 2023-07-18 ENCOUNTER — Other Ambulatory Visit: Payer: Self-pay

## 2023-07-18 ENCOUNTER — Other Ambulatory Visit (HOSPITAL_COMMUNITY): Payer: Self-pay

## 2023-07-18 ENCOUNTER — Other Ambulatory Visit: Payer: Self-pay | Admitting: Family Medicine

## 2023-07-18 MED ORDER — AZO BORIC ACID 600 MG VA SUPP
600.0000 mg | VAGINAL | 0 refills | Status: DC
  Filled 2023-07-18: qty 14, fill #0

## 2023-07-19 ENCOUNTER — Other Ambulatory Visit: Payer: Self-pay | Admitting: *Deleted

## 2023-07-19 ENCOUNTER — Other Ambulatory Visit (HOSPITAL_COMMUNITY): Payer: Self-pay

## 2023-07-19 MED ORDER — ZEPBOUND 5 MG/0.5ML ~~LOC~~ SOAJ
5.0000 mg | SUBCUTANEOUS | 0 refills | Status: DC
  Filled 2023-07-19 – 2023-08-03 (×4): qty 2, 28d supply, fill #0

## 2023-07-19 MED ORDER — LATANOPROST 0.005 % OP SOLN
1.0000 [drp] | Freq: Every day | OPHTHALMIC | 3 refills | Status: AC
  Filled 2023-07-19: qty 2.5, 25d supply, fill #0
  Filled 2023-08-03: qty 7.5, 90d supply, fill #1
  Filled 2023-10-25: qty 7.5, 90d supply, fill #2
  Filled 2024-01-01: qty 7.5, 90d supply, fill #3
  Filled 2024-04-04: qty 5, 50d supply, fill #4

## 2023-07-21 ENCOUNTER — Other Ambulatory Visit: Payer: Self-pay | Admitting: Dermatology

## 2023-07-21 DIAGNOSIS — L309 Dermatitis, unspecified: Secondary | ICD-10-CM

## 2023-07-24 ENCOUNTER — Other Ambulatory Visit: Payer: Self-pay | Admitting: Family Medicine

## 2023-07-25 ENCOUNTER — Other Ambulatory Visit (HOSPITAL_COMMUNITY): Payer: Self-pay

## 2023-07-25 MED ORDER — OXYCODONE-ACETAMINOPHEN 10-325 MG PO TABS
1.5000 | ORAL_TABLET | Freq: Four times a day (QID) | ORAL | 0 refills | Status: DC | PRN
Start: 2023-07-25 — End: 2023-08-23
  Filled 2023-07-25: qty 180, 30d supply, fill #0

## 2023-07-29 ENCOUNTER — Other Ambulatory Visit (HOSPITAL_COMMUNITY): Payer: Self-pay

## 2023-08-03 ENCOUNTER — Other Ambulatory Visit: Payer: Self-pay

## 2023-08-03 ENCOUNTER — Other Ambulatory Visit (HOSPITAL_COMMUNITY): Payer: Self-pay

## 2023-08-12 ENCOUNTER — Other Ambulatory Visit (HOSPITAL_COMMUNITY): Payer: Self-pay

## 2023-08-14 ENCOUNTER — Other Ambulatory Visit (HOSPITAL_COMMUNITY): Payer: Self-pay

## 2023-08-14 ENCOUNTER — Other Ambulatory Visit: Payer: Self-pay | Admitting: Family Medicine

## 2023-08-14 MED ORDER — ZEPBOUND 7.5 MG/0.5ML ~~LOC~~ SOAJ
7.5000 mg | SUBCUTANEOUS | 0 refills | Status: DC
  Filled 2023-08-14: qty 2, 28d supply, fill #0

## 2023-08-15 ENCOUNTER — Other Ambulatory Visit: Payer: Self-pay

## 2023-08-15 ENCOUNTER — Other Ambulatory Visit: Payer: Self-pay | Admitting: Family Medicine

## 2023-08-15 ENCOUNTER — Other Ambulatory Visit (HOSPITAL_COMMUNITY): Payer: Self-pay

## 2023-08-15 MED ORDER — CELECOXIB 200 MG PO CAPS
200.0000 mg | ORAL_CAPSULE | Freq: Every day | ORAL | 0 refills | Status: DC | PRN
  Filled 2023-08-15: qty 30, 30d supply, fill #0

## 2023-08-15 NOTE — Telephone Encounter (Signed)
 Let pt know I only sent 30 as Dr. Bascom Lily said to limit use

## 2023-08-16 ENCOUNTER — Other Ambulatory Visit: Payer: Self-pay

## 2023-08-16 ENCOUNTER — Other Ambulatory Visit (HOSPITAL_COMMUNITY): Payer: Self-pay

## 2023-08-16 NOTE — Telephone Encounter (Signed)
 Patient notified

## 2023-08-23 ENCOUNTER — Other Ambulatory Visit (HOSPITAL_COMMUNITY): Payer: Self-pay

## 2023-08-23 MED ORDER — VITAMIN D (ERGOCALCIFEROL) 1.25 MG (50000 UNIT) PO CAPS
50000.0000 [IU] | ORAL_CAPSULE | ORAL | 1 refills | Status: DC
  Filled 2023-08-23 – 2023-09-05 (×3): qty 12, 84d supply, fill #0
  Filled 2023-12-15: qty 12, 84d supply, fill #1

## 2023-08-23 MED ORDER — OXYCODONE-ACETAMINOPHEN 10-325 MG PO TABS
1.5000 | ORAL_TABLET | Freq: Four times a day (QID) | ORAL | 0 refills | Status: DC | PRN
Start: 2023-08-23 — End: 2023-09-20
  Filled 2023-08-23: qty 180, 30d supply, fill #0

## 2023-08-25 ENCOUNTER — Other Ambulatory Visit (HOSPITAL_COMMUNITY): Payer: Self-pay

## 2023-09-05 ENCOUNTER — Other Ambulatory Visit (HOSPITAL_COMMUNITY): Payer: Self-pay

## 2023-09-19 ENCOUNTER — Other Ambulatory Visit (HOSPITAL_COMMUNITY): Payer: Self-pay

## 2023-09-19 ENCOUNTER — Other Ambulatory Visit: Payer: Self-pay | Admitting: Family Medicine

## 2023-09-19 ENCOUNTER — Other Ambulatory Visit: Payer: Self-pay

## 2023-09-19 ENCOUNTER — Telehealth: Payer: Self-pay | Admitting: *Deleted

## 2023-09-19 MED ORDER — ZEPBOUND 7.5 MG/0.5ML ~~LOC~~ SOAJ
7.5000 mg | SUBCUTANEOUS | 0 refills | Status: DC
  Filled 2023-09-19: qty 2, 28d supply, fill #0

## 2023-09-19 NOTE — Telephone Encounter (Signed)
 Copied from CRM [id number]. Topic: Clinical - Medication Question >> Sep 19, 2023  8:46 AM Adonis Hoot wrote: Reason for CRM: Patient would like prescription for next dosage after 7.5 she stated that she's currently out of the medication.  [facility]  Phone: [phone] Fax: [phone] Rx sent to the pharmacy.

## 2023-09-20 ENCOUNTER — Ambulatory Visit: Payer: Medicare PPO | Admitting: Family Medicine

## 2023-09-20 ENCOUNTER — Other Ambulatory Visit (HOSPITAL_COMMUNITY): Payer: Self-pay

## 2023-09-20 ENCOUNTER — Encounter: Payer: Self-pay | Admitting: *Deleted

## 2023-09-20 MED ORDER — OXYCODONE-ACETAMINOPHEN 10-325 MG PO TABS
1.5000 | ORAL_TABLET | Freq: Four times a day (QID) | ORAL | 0 refills | Status: AC | PRN
  Filled 2023-09-20 – 2023-09-22 (×2): qty 180, 30d supply, fill #0

## 2023-09-20 NOTE — Telephone Encounter (Signed)
 Left message for patient

## 2023-09-20 NOTE — Telephone Encounter (Signed)
 Patient stated it is okay to wait until appointment on tomorrow.

## 2023-09-21 ENCOUNTER — Encounter: Payer: Self-pay | Admitting: Family Medicine

## 2023-09-21 ENCOUNTER — Ambulatory Visit (INDEPENDENT_AMBULATORY_CARE_PROVIDER_SITE_OTHER): Admitting: Family Medicine

## 2023-09-21 ENCOUNTER — Other Ambulatory Visit (HOSPITAL_COMMUNITY): Payer: Self-pay

## 2023-09-21 VITALS — BP 130/72 | HR 72 | Temp 98.0°F | Ht 63.0 in | Wt 248.6 lb

## 2023-09-21 DIAGNOSIS — R7303 Prediabetes: Secondary | ICD-10-CM

## 2023-09-21 DIAGNOSIS — M2550 Pain in unspecified joint: Secondary | ICD-10-CM | POA: Diagnosis not present

## 2023-09-21 DIAGNOSIS — F5101 Primary insomnia: Secondary | ICD-10-CM

## 2023-09-21 DIAGNOSIS — E7800 Pure hypercholesterolemia, unspecified: Secondary | ICD-10-CM | POA: Diagnosis not present

## 2023-09-21 MED ORDER — ZEPBOUND 10 MG/0.5ML ~~LOC~~ SOAJ
10.0000 mg | SUBCUTANEOUS | 0 refills | Status: DC
  Filled 2023-09-21: qty 2, 28d supply, fill #0

## 2023-09-21 MED ORDER — CYCLOBENZAPRINE HCL 10 MG PO TABS
10.0000 mg | ORAL_TABLET | Freq: Every evening | ORAL | 1 refills | Status: AC | PRN
Start: 2023-09-21 — End: ?
  Filled 2023-09-21: qty 90, 90d supply, fill #0
  Filled 2024-03-06: qty 90, 90d supply, fill #1

## 2023-09-21 MED ORDER — ATORVASTATIN CALCIUM 20 MG PO TABS
20.0000 mg | ORAL_TABLET | Freq: Every day | ORAL | 1 refills | Status: AC
Start: 2023-09-21 — End: ?
  Filled 2023-09-21: qty 90, 90d supply, fill #0
  Filled 2024-01-11: qty 90, 90d supply, fill #1

## 2023-09-21 NOTE — Progress Notes (Signed)
 Subjective:     Patient ID: Sandra Bird, female    DOB: 1970/05/22, 53 y.o.   MRN: [id number]  Chief Complaint  Patient presents with   Follow up   Medication Refill    Pt states she needs refills on multiple medication today    HPI Discussed the use of AI scribe software for clinical note transcription with the patient, who gave verbal consent to proceed.  History of Present Illness Sandra Bird is a 53 year old female who presents for follow-up on weight management and medication adjustment.  She is currently taking Zepbound  7.5 mg for weight management. The previous dose was ineffective in reducing her appetite, but she has recently noticed a decrease in appetite late at night with the current dose. She missed a dose this week and prefers to take it on Mondays to track progress. No unmanageable side effects have been experienced.  She is also on Humira 40 mg for an inflammatory arthritis and experiences relief within two hours of her first injection. She had a follow-up appointment with the rheumatologist yesterday.  She is taking atorvastatin  20 mg for cholesterol management and needs a refill. Her current medications also include albuterol , clobetasol  cream, cyclobenzaprine  at bedtime, vitamin D , Lanoprost eye drops, Linzess , mirtazapine  for sleep, Narcan , and pantoprazole . She is not currently taking Celebrex  or hydroxyzine .  No new surgeries have occurred since her last visit. She is exercising as much as possible while making healthy dietary choices, emphasizing the importance of maintaining adequate nutrition to support her energy levels for exercise and daily activities.  No chest pain, shortness of breath, or coughing. She has had recent blood work and a bone density test, but the results are not available in the current records.    Health Maintenance Due  Topic Date Due   DTaP/Tdap/Td (1 - Tdap) Never done   Zoster Vaccines- Shingrix (1 of 2) Never done     Past Medical History:  Diagnosis Date   Arthritis    SVD (spontaneous vaginal delivery)    x 3    Past Surgical History:  Procedure Laterality Date   ABDOMINOPLASTY/PANNICULECTOMY     ARTERY BIOPSY Left 06/16/2023   Procedure: BIOPSY TEMPORAL ARTERY;  Surgeon: Kayla Part, MD;  Location: [facility] OR;  Service: Vascular;  Laterality: Left;   CHOLECYSTECTOMY     DILITATION & CURRETTAGE/HYSTROSCOPY WITH NOVASURE ABLATION N/A 07/25/2013   Procedure: DILATATION & CURETTAGE/HYSTEROSCOPY WITH NOVASURE ABLATION;  Surgeon: Deann Exon, MD;  Location: [facility] ORS;  Service: Gynecology;  Laterality: N/A;   TONSILLECTOMY     TOTAL HIP ARTHROPLASTY Right 03/24/2020   Procedure: RIGHT TOTAL HIP ARTHROPLASTY ANTERIOR APPROACH;  Surgeon: Dayne Even, MD;  Location: [facility] OR[facility];  Service: Orthopedics;  Laterality: Right;   TUBAL LIGATION     WISDOM TOOTH EXTRACTION       Current Outpatient Medications:    adalimumab (HUMIRA, 2 PEN,) 40 MG/0.4ML pen, Inject into the skin., Disp: , Rfl:    albuterol  (VENTOLIN  HFA) 108 (90 Base) MCG/ACT inhaler, Inhale 2 puffs into the lungs every 6 (six) hours as needed for wheezing or shortness of breath., Disp: 6.7 g, Rfl: 0   betamethasone  dipropionate 0.05 % cream, APPLY TO THE HANDS (RASH) TWICE DAILY AS NEEDED FOR TWO WEEKS THEN STOP, Disp: 45 g, Rfl: 3   Boric Acid Vaginal (AZO BORIC ACID) 600 MG SUPP, Place 600 mg vaginally once a week. As needed hormones, Disp: 14 suppository, Rfl: 0   clobetasol  cream (  TEMOVATE ) 0.05 %, Apply topically 2 (two) times daily. Apply to affected areas of hands twice daily x 2 weeks. (Patient taking differently: Apply 1 Application topically daily as needed (Rash on hands).), Disp: 60 g, Rfl: 1   clotrimazole -betamethasone  (LOTRISONE ) cream, APPLY A THIN COAT DAILY AS NEEDED (Patient taking differently: Apply 1 Application topically daily as needed (Rash on hands).), Disp: 45 g, Rfl: 2   ergocalciferol  (VITAMIN D2) 1.25 MG (50000 UT)  capsule, Take 1 capsule (50,000 Units total) by mouth once a week., Disp: 12 capsule, Rfl: 1   hydrOXYzine  (ATARAX ) 10 MG tablet, Take 1 tablet (10 mg total) by mouth at bedtime. (Patient taking differently: Take 10 mg by mouth at bedtime as needed (Sleep).), Disp: 30 tablet, Rfl: 0   latanoprost  (XALATAN ) 0.005 % ophthalmic solution, Place 1 drop into both eyes at bedtime, Disp: 7.5 mL, Rfl: 3   latanoprost  (XALATAN ) 0.005 % ophthalmic solution, Place 1 drop into both eyes at bedtime., Disp: 7.5 mL, Rfl: 3   linaclotide  (LINZESS ) 145 MCG CAPS capsule, Take 1 capsule (145 mcg total) by mouth daily. Before the first meal of the day on an empty stomach., Disp: 30 capsule, Rfl: 6   mirtazapine  (REMERON ) 15 MG tablet, Take 1 tablet (15 mg total) by mouth at bedtime., Disp: 30 tablet, Rfl: 2   Multiple Vitamins-Minerals (MULTIVITAMIN WITH MINERALS) tablet, Take 1 tablet by mouth daily., Disp: , Rfl:    ondansetron  (ZOFRAN ) 4 MG tablet, Take 2 tablets (8 mg total) by mouth 2 to 3 times per day as needed for nausea and vomiting, Disp: 42 tablet, Rfl: 0   oxyCODONE -acetaminophen  (PERCOCET) 10-325 MG tablet, Take 1 (one) tablet by mouth four times daily, as needed (Patient taking differently: Take 1 tablet by mouth 3 (three) times daily.), Disp: 120 tablet, Rfl: 0   oxyCODONE -acetaminophen  (PERCOCET) 10-325 MG tablet, Take 1.5 tablets by mouth 4 (four) times daily as needed for pain, Disp: 180 tablet, Rfl: 0   pantoprazole  (PROTONIX ) 40 MG tablet, Take 1 tablet (40 mg total) by mouth daily., Disp: 90 tablet, Rfl: 1   tirzepatide  (ZEPBOUND ) 10 MG/0.5ML Pen, Inject 10 mg into the skin once a week., Disp: 2 mL, Rfl: 0   Vitamin D , Ergocalciferol , (DRISDOL ) 1.25 MG (50000 UNIT) CAPS capsule, Take 1 capsule (50,000 Units total) by mouth once a week., Disp: 12 capsule, Rfl: 2   Vitamin D , Ergocalciferol , (DRISDOL ) 1.25 MG (50000 UNIT) CAPS capsule, Take 1 capsule (50,000 Units total) by mouth once a week., Disp: 12  capsule, Rfl: 1   atorvastatin  (LIPITOR) 20 MG tablet, Take 1 tablet (20 mg total) by mouth daily., Disp: 90 tablet, Rfl: 1   cyclobenzaprine  (FLEXERIL ) 10 MG tablet, Take 1 tablet (10 mg total) by mouth at bedtime as needed for muscle spasms., Disp: 90 tablet, Rfl: 1   estradiol  (VIVELLE -DOT) 0.1 MG/24HR patch, Place 1 patch (0.1 mg total) onto the skin 2 (two) times a week. (Patient not taking: Reported on 09/21/2023), Disp: 24 patch, Rfl: 0   naloxone  (NARCAN ) nasal spray 4 mg/0.1 mL, 1 spray every 3 minutes; spray 1 dose into ONE nostril; alternate nostrils with each dose until help arrives (Patient not taking: Reported on 06/21/2023), Disp: 2 each, Rfl: 1   naloxone  (NARCAN ) nasal spray 4 mg/0.1 mL, Use 1 spray every 3 minutes; spray 1 dose into ONE nostril; alternate nostrils with each dose until help arrives (Patient not taking: Reported on 09/21/2023), Disp: 1 each, Rfl: 3   progesterone  (PROMETRIUM ) 100  MG capsule, Take 1 capsule (100 mg total) by mouth at bedtime. (Patient not taking: Reported on 09/21/2023), Disp: 90 capsule, Rfl: 0  Allergies  Allergen Reactions   Metronidazole  Nausea And Vomiting    Other Reaction(s): vomiting  Metronidazole  Patient stated no issues with this med    Penicillin G Rash   Penicillins Other (See Comments) and Rash    Has patient had a PCN reaction causing immediate rash, facial/tongue/throat swelling, SOB or lightheadedness with hypotension: Yes  Has patient had a PCN reaction causing severe rash involving mucus membranes or skin necrosis: No  Has patient had a PCN reaction that required hospitalization No  Has patient had a PCN reaction occurring within the last 10 years: No  If all of the above answers are "NO", then may proceed with Cephalosporin use.   ROS neg/noncontributory except as noted HPI/below      Objective:      BP 130/72   Pulse 72   Temp 98 F (36.7 C) (Temporal)   Ht 5\' 3"  (1.6 m)   Wt 248 lb 9.6 oz (112.8 kg)   SpO2  98%   BMI 44.04 kg/m  Wt Readings from Last 3 Encounters:  09/21/23 248 lb 9.6 oz (112.8 kg)  06/23/23 254 lb (115.2 kg)  06/21/23 250 lb (113.4 kg)    Physical Exam   Gen: WDWN NAD HEENT: NCAT, conjunctiva not injected, sclera nonicteric NECK:  supple, no thyromegaly, no nodes, no carotid bruits CARDIAC: RRR, S1S2+, no murmur. DP 2+B LUNGS: CTAB. No wheezes ABDOMEN:  BS+, soft, NTND, No HSM, no masses EXT:  no edema MSK: no gross abnormalities.  NEURO: A&O x3.  CN II-XII intact.  PSYCH: normal mood. Good eye contact     Assessment & Plan:  Hypercholesteremia -     Atorvastatin  Calcium ; Take 1 tablet (20 mg total) by mouth daily.  Dispense: 90 tablet; Refill: 1  Polyarthralgia -     Cyclobenzaprine  HCl; Take 1 tablet (10 mg total) by mouth at bedtime as needed for muscle spasms.  Dispense: 90 tablet; Refill: 1  Morbid (severe) obesity due to excess calories (HCC)  Prediabetes  Primary insomnia  Other orders -     Zepbound ; Inject 10 mg into the skin once a week.  Dispense: 2 mL; Refill: 0  Assessment and Plan Assessment & Plan Obesity   Obesity management with Zepbound  7.5 mg was initially ineffective, but appetite suppression has begun. To enhance weight loss, increase the dose to 10 mg. No unmanageable side effects have been reported. She is engaging in exercise and making healthy dietary choices but needs to ensure adequate caloric intake for energy maintenance. Prescribe Zepbound  10 mg, monitor progress, and report in one month. Encourage regular exercise and healthy eating while ensuring adequate caloric intake.  Hyperlipidemia   Hyperlipidemia is managed with atorvastatin  20 mg. She is currently out of medication and requires a refill. Previous high cholesterol levels may be due to non-adherence. Refill atorvastatin  20 mg prescription   Inflammatory arthritis    arthritis is managed with Humira 40 mg. She reports significant relief within two hours of injection,  indicating a positive response. She is able to exercise longer due to symptom relief. Continue Humira 40 mg as prescribed and encourage regular exercise as tolerated. Still on oxy from pain clinic  Gastroesophageal reflux disease (GERD)   GERD management with pantoprazole  is ongoing.  Glaucoma   Glaucoma management with Latanoprost  eye drops is ongoing.  preDM-working on diet/exercise  Return in about 3 months (around 12/22/2023) for chronic follow-up.  Ellsworth Haas, MD

## 2023-09-21 NOTE — Patient Instructions (Signed)

## 2023-09-22 ENCOUNTER — Other Ambulatory Visit (HOSPITAL_COMMUNITY): Payer: Self-pay

## 2023-09-22 ENCOUNTER — Other Ambulatory Visit: Payer: Self-pay

## 2023-09-25 ENCOUNTER — Other Ambulatory Visit (HOSPITAL_COMMUNITY): Payer: Self-pay

## 2023-10-04 ENCOUNTER — Other Ambulatory Visit: Payer: Self-pay | Admitting: Dermatology

## 2023-10-04 ENCOUNTER — Other Ambulatory Visit (HOSPITAL_COMMUNITY): Payer: Self-pay

## 2023-10-05 ENCOUNTER — Other Ambulatory Visit: Payer: Self-pay

## 2023-10-05 ENCOUNTER — Other Ambulatory Visit (HOSPITAL_COMMUNITY): Payer: Self-pay

## 2023-10-05 MED ORDER — CLOTRIMAZOLE-BETAMETHASONE 1-0.05 % EX CREA
1.0000 | TOPICAL_CREAM | Freq: Every day | CUTANEOUS | 2 refills | Status: DC | PRN
  Filled 2023-10-05: qty 45, 30d supply, fill #0
  Filled 2024-03-06: qty 45, 30d supply, fill #1

## 2023-10-05 MED ORDER — CLOBETASOL PROPIONATE 0.05 % EX CREA
TOPICAL_CREAM | Freq: Two times a day (BID) | CUTANEOUS | 1 refills | Status: AC
  Filled 2023-10-05: qty 15, 30d supply, fill #0
  Filled 2023-12-15: qty 15, 30d supply, fill #1
  Filled 2024-04-01: qty 15, 30d supply, fill #2
  Filled 2024-05-03: qty 15, 30d supply, fill #3

## 2023-10-06 ENCOUNTER — Other Ambulatory Visit: Payer: Self-pay

## 2023-10-10 ENCOUNTER — Other Ambulatory Visit (HOSPITAL_COMMUNITY): Payer: Self-pay

## 2023-10-13 ENCOUNTER — Other Ambulatory Visit (HOSPITAL_COMMUNITY): Payer: Self-pay

## 2023-10-18 ENCOUNTER — Other Ambulatory Visit: Payer: Self-pay | Admitting: *Deleted

## 2023-10-18 ENCOUNTER — Other Ambulatory Visit (HOSPITAL_COMMUNITY): Payer: Self-pay

## 2023-10-18 MED ORDER — ZEPBOUND 12.5 MG/0.5ML ~~LOC~~ SOAJ
12.5000 mg | SUBCUTANEOUS | 0 refills | Status: DC
  Filled 2023-10-18: qty 2, 28d supply, fill #0

## 2023-10-20 ENCOUNTER — Other Ambulatory Visit (HOSPITAL_COMMUNITY): Payer: Self-pay

## 2023-10-20 MED ORDER — OXYCODONE-ACETAMINOPHEN 10-325 MG PO TABS
1.5000 | ORAL_TABLET | Freq: Four times a day (QID) | ORAL | 0 refills | Status: AC
  Filled 2023-10-20: qty 180, 30d supply, fill #0

## 2023-10-25 ENCOUNTER — Other Ambulatory Visit (HOSPITAL_COMMUNITY): Payer: Self-pay

## 2023-11-08 ENCOUNTER — Ambulatory Visit: Admitting: Dermatology

## 2023-11-08 ENCOUNTER — Encounter: Payer: Self-pay | Admitting: Dermatology

## 2023-11-08 VITALS — BP 124/83 | HR 75

## 2023-11-08 DIAGNOSIS — L309 Dermatitis, unspecified: Secondary | ICD-10-CM

## 2023-11-08 DIAGNOSIS — M47819 Spondylosis without myelopathy or radiculopathy, site unspecified: Secondary | ICD-10-CM | POA: Insufficient documentation

## 2023-11-08 DIAGNOSIS — L720 Epidermal cyst: Secondary | ICD-10-CM | POA: Diagnosis not present

## 2023-11-08 DIAGNOSIS — K2930 Chronic superficial gastritis without bleeding: Secondary | ICD-10-CM | POA: Insufficient documentation

## 2023-11-08 DIAGNOSIS — L729 Follicular cyst of the skin and subcutaneous tissue, unspecified: Secondary | ICD-10-CM

## 2023-11-08 MED ORDER — SAFETY SEAL MISCELLANEOUS MISC: 1.0000 | Freq: Every morning | 11 refills | Status: AC

## 2023-11-08 MED ORDER — ZORYVE 0.15 % EX CREA: 1.0000 | TOPICAL_CREAM | Freq: Every day | CUTANEOUS | 9 refills | Status: AC

## 2023-11-08 NOTE — Patient Instructions (Addendum)
 Date: Wed Nov 08 2023  Hello Sandra Bird,  Thank you for visiting today. Here is a summary of the key instructions:  - Medications:   - Use Zoryve  (topical anti-inflammatory) once daily for 2 months, then twice weekly for maintenance   - Stop using clobetasol   - Skin Care Instructions:   - For the spot under your breast:     - Wash with benzoyl peroxide before showering     - Leave on for 30 to 60 seconds, then rinse off     - Use a warm compress and gently squeeze with Q-tips if needed   - Use benzoyl peroxide wash under breast, arms, and groin to prevent odor   - Try CeraVe Acne Foaming Face Wash or Panoxyl brand  - Follow-up: Next appointment in November to check on Zoryve  treatment  - Additional Instructions:   - If Zoryve  is too expensive, send a MyChart message for an alternative   - Avoid squeezing the spot under your breast too hard to prevent infection  We look forward to seeing you at your next visit. If you have any questions or concerns before then, please do not hesitate to contact our office.  Warm regards,  Dr. Delon Lenis, Dermatology      Important Information   Due to recent changes in healthcare laws, you may see results of your pathology and/or laboratory studies on MyChart before the doctors have had a chance to review them. We understand that in some cases there may be results that are confusing or concerning to you. Please understand that not all results are received at the same time and often the doctors may need to interpret multiple results in order to provide you with the best plan of care or course of treatment. Therefore, we ask that you please give us  2 business days to thoroughly review all your results before contacting the office for clarification. Should we see a critical lab result, you will be contacted sooner.     If You Need Anything After Your Visit   If you have any questions or concerns for your doctor, please call our main line at  [phone]. If no one answers, please leave a voicemail as directed and we will return your call as soon as possible. Messages left after 4 pm will be answered the following business day.    You may also send us  a message via MyChart. We typically respond to MyChart messages within 1-2 business days.  For prescription refills, please ask your pharmacy to contact our office. Our fax number is [phone].  If you have an urgent issue when the clinic is closed that cannot wait until the next business day, you can page your doctor at the number below.     Please note that while we do our best to be available for urgent issues outside of office hours, we are not available 24/7.    If you have an urgent issue and are unable to reach us , you may choose to seek medical care at your doctor's office, retail clinic, urgent care center, or emergency room.   If you have a medical emergency, please immediately call 911 or go to the emergency department. In the event of inclement weather, please call our main line at [phone] for an update on the status of any delays or closures.  Dermatology Medication Tips: Please keep the boxes that topical medications come in in order to help keep track of the instructions about where and how  to use these. Pharmacies typically print the medication instructions only on the boxes and not directly on the medication tubes.   If your medication is too expensive, please contact our office at [phone] or send us  a message through MyChart.    We are unable to tell what your co-pay for medications will be in advance as this is different depending on your insurance coverage. However, we may be able to find a substitute medication at lower cost or fill out paperwork to get insurance to cover a needed medication.    If a prior authorization is required to get your medication covered by your insurance company, please allow us  1-2 business days to complete this process.    Drug prices often vary depending on where the prescription is filled and some pharmacies may offer cheaper prices.   The website www.goodrx.com contains coupons for medications through different pharmacies. The prices here do not account for what the cost may be with help from insurance (it may be cheaper with your insurance), but the website can give you the price if you did not use any insurance.  - You can print the associated coupon and take it with your prescription to the pharmacy.  - You may also stop by our office during regular business hours and pick up a GoodRx coupon card.  - If you need your prescription sent electronically to a different pharmacy, notify our office through [facility] or by phone at [phone]

## 2023-11-08 NOTE — Progress Notes (Signed)
   Follow-Up Visit   Subjective  Sandra Bird is a 53 y.o. female who presents for the following: Mole Check  Patient present today and a new concern (Mole Check). Patient was last evaluated on 10/18/22. Patient reports she notice this small spot undeneath her right breast. The spot does not hurt but it does have and odor. She also has hand eczema. She states the betamethasone  is no longer working for her. 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 Her last usage of betamethasone  was yesterday. She rates her itch today 8 out of 10. Patient denies medication changes.  The following portions of the chart were reviewed this encounter and updated as appropriate: medications, allergies, medical history  Review of Systems:  No other skin or systemic complaints except as noted in HPI or Assessment and Plan.  Objective  Well appearing patient in no apparent distress; mood and affect are within normal limits.  A focused examination was performed of the following areas: B/L Hands, and Underneath right Breast  Relevant exam findings are noted in the Assessment and Plan.                Assessment & Plan   HAND DERMATITIS Exam: Scaly pink plaques +/- fissures  Flared  Hand Dermatitis is a chronic type of eczema that can come and go on the hands and fingers.  While there is no cure, the rash and symptoms can be managed with topical prescription medications, and for more severe cases, with systemic medications.  Recommend mild soap and routine use of moisturizing cream after handwashing.  Minimize soap/water  exposure when possible.    Treatment Plan: - Recommend mild soap and moisturizing cream with hand washing.  - Prescribed Zoryve  0.15% to apply daily for 3 months  , after 3 months use 2 times weekly  EPIDERMAL INCLUSION CYST Exam: Subcutaneous nodule at Underneath right  breast  Benign-appearing. Exam most consistent with an epidermal inclusion cyst. Discussed that a cyst is a benign growth that can grow over time and sometimes get irritated or inflamed. Recommend observation if it is not bothersome. Discussed option of surgical excision to remove it if it is growing, symptomatic, or other changes noted. Please call for new or changing lesions so they can be evaluated.  Treatment plan: - Recommended washing daily with BPO to prevent odor (Recommended CeraVe or Panoxly)  HAND ECZEMA   Related Medications Roflumilast  (ZORYVE ) 0.15 % CREA Apply 1 Application topically daily.  Return in about 4 months (around 03/10/2024) for Hand Eczema & [facility]CCA F/U.  I, Jetta Ager, am acting as Neurosurgeon for [employer], DO.  Documentation: I have reviewed the above documentation for accuracy and completeness, and I agree with the above.  Delon Lenis, DO

## 2023-11-16 ENCOUNTER — Other Ambulatory Visit: Payer: Self-pay | Admitting: *Deleted

## 2023-11-16 ENCOUNTER — Other Ambulatory Visit (HOSPITAL_COMMUNITY): Payer: Self-pay

## 2023-11-16 MED ORDER — ZEPBOUND 12.5 MG/0.5ML ~~LOC~~ SOAJ
12.5000 mg | SUBCUTANEOUS | 0 refills | Status: DC
  Filled 2023-11-16: qty 2, 28d supply, fill #0

## 2023-11-17 ENCOUNTER — Other Ambulatory Visit (HOSPITAL_COMMUNITY): Payer: Self-pay

## 2023-11-17 MED ORDER — OXYCODONE-ACETAMINOPHEN 10-325 MG PO TABS
1.5000 | ORAL_TABLET | Freq: Four times a day (QID) | ORAL | 0 refills | Status: AC | PRN
  Filled 2023-11-17: qty 180, 30d supply, fill #0

## 2023-12-11 ENCOUNTER — Other Ambulatory Visit: Payer: Self-pay | Admitting: Family Medicine

## 2023-12-11 MED ORDER — ZEPBOUND 12.5 MG/0.5ML ~~LOC~~ SOAJ
12.5000 mg | SUBCUTANEOUS | 0 refills | Status: DC
  Filled 2023-12-11: qty 2, 28d supply, fill #0

## 2023-12-12 ENCOUNTER — Other Ambulatory Visit (HOSPITAL_COMMUNITY): Payer: Self-pay

## 2023-12-15 ENCOUNTER — Other Ambulatory Visit (HOSPITAL_COMMUNITY): Payer: Self-pay

## 2023-12-15 MED ORDER — OXYCODONE-ACETAMINOPHEN 10-325 MG PO TABS
1.5000 | ORAL_TABLET | Freq: Four times a day (QID) | ORAL | 0 refills | Status: AC
  Filled 2023-12-15: qty 180, 30d supply, fill #0

## 2023-12-26 ENCOUNTER — Encounter: Payer: Self-pay | Admitting: Family Medicine

## 2023-12-26 ENCOUNTER — Ambulatory Visit (INDEPENDENT_AMBULATORY_CARE_PROVIDER_SITE_OTHER): Admitting: Family Medicine

## 2023-12-26 ENCOUNTER — Other Ambulatory Visit (HOSPITAL_COMMUNITY): Payer: Self-pay

## 2023-12-26 ENCOUNTER — Ambulatory Visit: Payer: Self-pay | Admitting: Family Medicine

## 2023-12-26 VITALS — BP 128/83 | HR 81 | Temp 98.2°F | Resp 16 | Ht 63.0 in | Wt 234.5 lb

## 2023-12-26 DIAGNOSIS — E7800 Pure hypercholesterolemia, unspecified: Secondary | ICD-10-CM

## 2023-12-26 DIAGNOSIS — R7303 Prediabetes: Secondary | ICD-10-CM

## 2023-12-26 DIAGNOSIS — Z79899 Other long term (current) drug therapy: Secondary | ICD-10-CM | POA: Diagnosis not present

## 2023-12-26 DIAGNOSIS — F5101 Primary insomnia: Secondary | ICD-10-CM

## 2023-12-26 DIAGNOSIS — K5904 Chronic idiopathic constipation: Secondary | ICD-10-CM | POA: Diagnosis not present

## 2023-12-26 DIAGNOSIS — E559 Vitamin D deficiency, unspecified: Secondary | ICD-10-CM

## 2023-12-26 DIAGNOSIS — K219 Gastro-esophageal reflux disease without esophagitis: Secondary | ICD-10-CM | POA: Diagnosis not present

## 2023-12-26 LAB — LIPID PANEL
Cholesterol: 166 mg/dL (ref 0–200)
HDL: 61.1 mg/dL (ref >39.00)
LDL Cholesterol: 88 mg/dL (ref 0–99)
NonHDL: 104.69
Total CHOL/HDL Ratio: 3
Triglycerides: 85 mg/dL (ref 0.0–149.0)
VLDL: 17 mg/dL (ref 0.0–40.0)

## 2023-12-26 LAB — HEMOGLOBIN A1C: Hgb A1c MFr Bld: 5.7 % (ref 4.6–6.5)

## 2023-12-26 MED ORDER — ZEPBOUND 12.5 MG/0.5ML ~~LOC~~ SOAJ
12.5000 mg | SUBCUTANEOUS | 2 refills | Status: DC
  Filled 2023-12-26 – 2024-01-11 (×2): qty 2, 28d supply, fill #0
  Filled 2024-02-12: qty 2, 28d supply, fill #1

## 2023-12-26 MED ORDER — MIRTAZAPINE 30 MG PO TABS
30.0000 mg | ORAL_TABLET | Freq: Every day | ORAL | 1 refills | Status: DC
  Filled 2023-12-26: qty 90, 90d supply, fill #0
  Filled 2024-02-12 – 2024-04-01 (×2): qty 90, 90d supply, fill #1

## 2023-12-26 MED ORDER — PANTOPRAZOLE SODIUM 40 MG PO TBEC
40.0000 mg | DELAYED_RELEASE_TABLET | Freq: Every day | ORAL | 1 refills | Status: AC
  Filled 2023-12-26 – 2024-02-12 (×2): qty 90, 90d supply, fill #0

## 2023-12-26 NOTE — Progress Notes (Signed)
 Awesome.  Same meds and continue working on it!

## 2023-12-26 NOTE — Progress Notes (Signed)
 Subjective:     Patient ID: Sandra Bird, female    DOB: 02/24/1971, 53 y.o.   MRN: [id number]  Chief Complaint  Patient presents with   Medical Management of Chronic Issues    3 month follow-up Fasting     HPI Discussed the use of AI scribe software for clinical note transcription with the patient, who gave verbal consent to proceed.  History of Present Illness Sandra Bird is a 53 year old female who presents for a follow-up visit for prediabetes, hyperlipidemia, and reflux.  She is currently on Zepbound  for weight management, having lost 22 pounds since starting the medication. She is on a dose of 12.5 mg, which she finds effective after titrating up from a lower dose. No side effects such as constipation, diarrhea, or nausea are reported from Zepbound .  For hyperlipidemia, she is taking atorvastatin  20 mg. A cholesterol check is due as it has not been done since starting the medication.  She is also on pantoprazole  for reflux, which she takes daily and reports improvement in symptoms, with food no longer getting stuck as it was before.  She takes Linzess  every other day to manage regularity and reports no constipation. Mirtazapine  is taken at night to aid sleep and relaxation, with two tablets being effective. No thoughts of suicide.  with recent blood work done by her rheumatologist, including liver and kidney function tests.  Her social history includes challenges with physical activity due to aches and pains, but she is making efforts to exercise as much as possible. She notes improvement in her knee pain with weight loss, stating 'I can tell the difference with the weight loss.'    Health Maintenance Due  Topic Date Due   Hepatitis B Vaccines 19-59 Average Risk (1 of 3 - 19+ 3-dose series) Never done   INFLUENZA VACCINE  Never done    Past Medical History:  Diagnosis Date   Arthritis    SVD (spontaneous vaginal delivery)    x 3    Past Surgical History:   Procedure Laterality Date   ABDOMINOPLASTY/PANNICULECTOMY     ARTERY BIOPSY Left 06/16/2023   Procedure: BIOPSY TEMPORAL ARTERY;  Surgeon: Lanis Fonda BRAVO, MD;  Location: [facility] OR;  Service: Vascular;  Laterality: Left;   CHOLECYSTECTOMY     DILITATION & CURRETTAGE/HYSTROSCOPY WITH NOVASURE ABLATION N/A 07/25/2013   Procedure: DILATATION & CURETTAGE/HYSTEROSCOPY WITH NOVASURE ABLATION;  Surgeon: Peggye Gull, MD;  Location: [facility] ORS;  Service: Gynecology;  Laterality: N/A;   TONSILLECTOMY     TOTAL HIP ARTHROPLASTY Right 03/24/2020   Procedure: RIGHT TOTAL HIP ARTHROPLASTY ANTERIOR APPROACH;  Surgeon: Sheril Coy, MD;  Location: [facility] ORS;  Service: Orthopedics;  Laterality: Right;   TUBAL LIGATION     WISDOM TOOTH EXTRACTION       Current Outpatient Medications:    adalimumab (HUMIRA, 2 PEN,) 40 MG/0.4ML pen, Inject into the skin., Disp: , Rfl:    albuterol  (VENTOLIN  HFA) 108 (90 Base) MCG/ACT inhaler, Inhale 2 puffs into the lungs every 6 (six) hours as needed for wheezing or shortness of breath., Disp: 6.7 g, Rfl: 0   atorvastatin  (LIPITOR) 20 MG tablet, Take 1 tablet (20 mg total) by mouth daily., Disp: 90 tablet, Rfl: 1   betamethasone  dipropionate 0.05 % cream, APPLY TO THE HANDS (RASH) TWICE DAILY AS NEEDED FOR TWO WEEKS THEN STOP, Disp: 45 g, Rfl: 3   Boric Acid Vaginal (AZO BORIC ACID) 600 MG SUPP, Place 600 mg vaginally once a week.  As needed hormones, Disp: 14 suppository, Rfl: 0   cholecalciferol (VITAMIN D3) 25 MCG (1000 UNIT) tablet, 1 tablet Orally Once a Week, Disp: , Rfl:    clobetasol  cream (TEMOVATE ) 0.05 %, Apply topically 2 (two) times daily. Apply to affected areas of hands twice daily x 2 weeks., Disp: 60 g, Rfl: 1   clotrimazole -betamethasone  (LOTRISONE ) cream, Apply a thin coat topically daily as needed., Disp: 45 g, Rfl: 2   cyclobenzaprine  (FLEXERIL ) 10 MG tablet, Take 1 tablet (10 mg total) by mouth at bedtime as needed for muscle spasms., Disp: 90 tablet, Rfl: 1    cyclobenzaprine  (FLEXERIL ) 10 MG tablet, 1 po q 8 hrs, Disp: , Rfl:    ergocalciferol  (VITAMIN D2) 1.25 MG (50000 UT) capsule, Take 1 capsule (50,000 Units total) by mouth once a week., Disp: 12 capsule, Rfl: 1   estradiol  (VIVELLE -DOT) 0.1 MG/24HR patch, Place 1 patch (0.1 mg total) onto the skin 2 (two) times a week., Disp: 24 patch, Rfl: 0   hydrOXYzine  (ATARAX ) 10 MG tablet, Take 1 tablet (10 mg total) by mouth at bedtime. (Patient taking differently: Take 10 mg by mouth at bedtime as needed (Sleep).), Disp: 30 tablet, Rfl: 0   ibuprofen  (ADVIL ) 600 MG tablet, 1 tablet Orally Three times a day, Disp: , Rfl:    latanoprost  (XALATAN ) 0.005 % ophthalmic solution, Place 1 drop into both eyes at bedtime, Disp: 7.5 mL, Rfl: 3   latanoprost  (XALATAN ) 0.005 % ophthalmic solution, Place 1 drop into both eyes at bedtime., Disp: 7.5 mL, Rfl: 3   linaclotide  (LINZESS ) 145 MCG CAPS capsule, Take 1 capsule (145 mcg total) by mouth daily. Before the first meal of the day on an empty stomach., Disp: 30 capsule, Rfl: 6   Multiple Vitamins-Minerals (MULTIVITAMIN WITH MINERALS) tablet, Take 1 tablet by mouth daily., Disp: , Rfl:    naloxone  (NARCAN ) nasal spray 4 mg/0.1 mL, 1 spray every 3 minutes; spray 1 dose into ONE nostril; alternate nostrils with each dose until help arrives, Disp: 2 each, Rfl: 1   naloxone  (NARCAN ) nasal spray 4 mg/0.1 mL, Use 1 spray every 3 minutes; spray 1 dose into ONE nostril; alternate nostrils with each dose until help arrives, Disp: 1 each, Rfl: 3   ondansetron  (ZOFRAN ) 4 MG tablet, Take 2 tablets (8 mg total) by mouth 2 to 3 times per day as needed for nausea and vomiting, Disp: 42 tablet, Rfl: 0   oxyCODONE -acetaminophen  (PERCOCET) 10-325 MG tablet, Take 1 (one) tablet by mouth four times daily, as needed (Patient taking differently: Take 1 tablet by mouth 3 (three) times daily.), Disp: 120 tablet, Rfl: 0   oxyCODONE -acetaminophen  (PERCOCET) 10-325 MG tablet, Take 1.5 tablets by mouth  4 (four) times daily as needed for pain, Disp: 180 tablet, Rfl: 0   oxyCODONE -acetaminophen  (PERCOCET) 10-325 MG tablet, Take 1.5 tablets by mouth 4 (four) times daily as needed for pain, Disp: 180 tablet, Rfl: 0   oxyCODONE -acetaminophen  (PERCOCET) 10-325 MG tablet, Take 1.5 tablets by mouth 4 (four) times daily as needed for pain, Disp: 180 tablet, Rfl: 0   oxyCODONE -acetaminophen  (PERCOCET) 10-325 MG tablet, Take 1.5 tablets by mouth 4 (four) times daily as needed for pain, Disp: 180 tablet, Rfl: 0   progesterone  (PROMETRIUM ) 100 MG capsule, Take 1 capsule (100 mg total) by mouth at bedtime., Disp: 90 capsule, Rfl: 0   Roflumilast  (ZORYVE ) 0.15 % CREA, Apply 1 Application topically daily., Disp: 60 g, Rfl: 9   Safety Seal Miscellaneous MISC, Apply 1 Application  topically in the morning. Medication Name: AA Gel (Minox 8% and Clobetasol  0.05%), Disp: 30 g, Rfl: 11   Vitamin D , Ergocalciferol , (DRISDOL ) 1.25 MG (50000 UNIT) CAPS capsule, Take 1 capsule (50,000 Units total) by mouth once a week., Disp: 12 capsule, Rfl: 2   Vitamin D , Ergocalciferol , (DRISDOL ) 1.25 MG (50000 UNIT) CAPS capsule, Take 1 capsule (50,000 Units total) by mouth once a week., Disp: 12 capsule, Rfl: 1   mirtazapine  (REMERON ) 30 MG tablet, Take 1 tablet (30 mg total) by mouth at bedtime., Disp: 90 tablet, Rfl: 1   pantoprazole  (PROTONIX ) 40 MG tablet, Take 1 tablet (40 mg total) by mouth daily., Disp: 90 tablet, Rfl: 1   tirzepatide  (ZEPBOUND ) 12.5 MG/0.5ML Pen, Inject 12.5 mg into the skin once a week., Disp: 2 mL, Rfl: 2  Allergies  Allergen Reactions   Metronidazole  Nausea And Vomiting    Other Reaction(s): vomiting  Metronidazole  Patient stated no issues with this med    Penicillin G Rash   Penicillins Other (See Comments) and Rash    Has patient had a PCN reaction causing immediate rash, facial/tongue/throat swelling, SOB or lightheadedness with hypotension: Yes  Has patient had a PCN reaction causing severe rash  involving mucus membranes or skin necrosis: No  Has patient had a PCN reaction that required hospitalization No  Has patient had a PCN reaction occurring within the last 10 years: No  If all of the above answers are NO, then may proceed with Cephalosporin use.   ROS neg/noncontributory except as noted HPI/below      Objective:     BP 128/83   Pulse 81   Temp 98.2 F (36.8 C) (Temporal)   Resp 16   Ht 5' 3 (1.6 m)   Wt 234 lb 8 oz (106.4 kg)   SpO2 100%   BMI 41.54 kg/m  Wt Readings from Last 3 Encounters:  12/26/23 234 lb 8 oz (106.4 kg)  09/21/23 248 lb 9.6 oz (112.8 kg)  06/23/23 254 lb (115.2 kg)    Physical Exam   Gen: WDWN NAD HEENT: NCAT, conjunctiva not injected, sclera nonicteric NECK:  supple, no thyromegaly, no nodes, no carotid bruits CARDIAC: RRR, S1S2+, no murmur. DP 2+B LUNGS: CTAB. No wheezes ABDOMEN:  BS+, soft, NTND, No HSM, no masses EXT:  no edema MSK: no gross abnormalities.  NEURO: A&O x3.  CN II-XII intact.  PSYCH: normal mood. Good eye contact     Results LABS Sedimentation rate: 38 mm/hr (12/22/2023) Glucose: 86 mg/dL (91/70/7974) Blood Urea Nitrogen (BUN): 15 mg/dL (91/70/7974) Creatinine: 0.75 mg/dL (91/70/7974) Glomerular Filtration Rate (GFR): 95 mL/min/1.73 m (12/22/2023) Sodium: 141 mmol/L (12/22/2023) Potassium: 4.2 mmol/L (12/22/2023) Chloride: 99 mmol/L (12/22/2023) Bicarbonate: 25 mmol/L (12/22/2023) Calcium : 9.5 mg/dL (91/70/7974) Total Protein: 7.3 g/dL (91/70/7974) Albumin: 4.5 g/dL (91/70/7974) Alkaline Phosphatase: 111 U/L (12/22/2023) Bilirubin: 0.9 mg/dL (91/70/7974) Hemoglobin: 13 g/dL (91/70/7974) Platelets: 332 x 10^3/L (12/22/2023) Aspartate Aminotransferase (AST): 20 U/L (12/22/2023) Alanine Aminotransferase (ALT): 16 U/L (12/22/2023)      Assessment & Plan:  Hypercholesterolemia -     Lipid panel  Prediabetes -     Hemoglobin A1c  Chronic idiopathic constipation  Gastroesophageal reflux  disease without esophagitis -     Pantoprazole  Sodium; Take 1 tablet (40 mg total) by mouth daily.  Dispense: 90 tablet; Refill: 1  High risk medication use  Vitamin D  deficiency  Primary insomnia -     Mirtazapine ; Take 1 tablet (30 mg total) by mouth at bedtime.  Dispense: 90 tablet;  Refill: 1  Other orders -     Zepbound ; Inject 12.5 mg into the skin once a week.  Dispense: 2 mL; Refill: 2  Assessment and Plan Assessment & Plan Prediabetes   She has prediabetes with a significant weight loss of 22 pounds since starting medication. The current regimen effectively controls appetite and aids weight loss. Continue the current dose of Zepbound , encourage healthy dietary choices, and promote regular physical activity as tolerated.  Pure hypercholesterolemia   She is on atorvastatin  20 mg. Cholesterol levels have not been checked since starting atorvastatin  and achieving weight loss. Order a cholesterol panel to assess current levels and continue atorvastatin  20 mg.  Gastroesophageal reflux disease   GERD is well-controlled with pantoprazole , with no significant issues with food getting stuck. Continue pantoprazole  daily.  Chronic idiopathic constipation   Constipation is well-managed with Linzess  taken every other day, with no constipation reported. Continue Linzess  every other day.  Primary insomnia   Insomnia is managed with mirtazapine , which also aids in relaxation. The current dose is nearly depleted, and she takes two tablets at night. Increase mirtazapine  to 30 mg at night and refill the prescription.    Return in about 3 months (around 03/26/2024) for wt.  Jenkins CHRISTELLA Carrel, MD

## 2023-12-26 NOTE — Patient Instructions (Signed)
 It was very nice to see you today!  Great work!   PLEASE NOTE:  If you had any lab tests please let us know if you have not heard back within a few days. You may see your results on MyChart before we have a chance to review them but we will give you a call once they are reviewed by [place]. If we ordered any referrals today, please let us know if you have not heard from their office within the next week.   Please try these tips to maintain a healthy lifestyle:  Eat most of your calories during the day when you are active. Eliminate processed foods including packaged sweets (pies, cakes, cookies), reduce intake of potatoes, white bread, white pasta, and white rice. Look for whole grain options, oat flour or almond flour.  Each meal should contain half fruits/vegetables, one quarter protein, and one quarter carbs (no bigger than a computer mouse).  Cut down on sweet beverages. This includes juice, soda, and sweet tea. Also watch fruit intake, though this is a healthier sweet option, it still contains natural sugar! Limit to 3 servings daily.  Drink at least 1 glass of water with each meal and aim for at least 8 glasses per day  Exercise at least 150 minutes every week.

## 2024-01-01 ENCOUNTER — Other Ambulatory Visit (HOSPITAL_COMMUNITY): Payer: Self-pay

## 2024-01-09 ENCOUNTER — Other Ambulatory Visit (HOSPITAL_COMMUNITY): Payer: Self-pay

## 2024-01-09 DIAGNOSIS — R0602 Shortness of breath: Secondary | ICD-10-CM | POA: Diagnosis not present

## 2024-01-09 DIAGNOSIS — E669 Obesity, unspecified: Secondary | ICD-10-CM | POA: Diagnosis not present

## 2024-01-09 DIAGNOSIS — G8929 Other chronic pain: Secondary | ICD-10-CM | POA: Diagnosis not present

## 2024-01-09 DIAGNOSIS — M25552 Pain in left hip: Secondary | ICD-10-CM | POA: Diagnosis not present

## 2024-01-09 DIAGNOSIS — M25551 Pain in right hip: Secondary | ICD-10-CM | POA: Diagnosis not present

## 2024-01-09 DIAGNOSIS — M25561 Pain in right knee: Secondary | ICD-10-CM | POA: Diagnosis not present

## 2024-01-09 DIAGNOSIS — Z79899 Other long term (current) drug therapy: Secondary | ICD-10-CM | POA: Diagnosis not present

## 2024-01-09 DIAGNOSIS — M25562 Pain in left knee: Secondary | ICD-10-CM | POA: Diagnosis not present

## 2024-01-09 DIAGNOSIS — M129 Arthropathy, unspecified: Secondary | ICD-10-CM | POA: Diagnosis not present

## 2024-01-09 MED ORDER — OXYCODONE-ACETAMINOPHEN 10-325 MG PO TABS
1.5000 | ORAL_TABLET | Freq: Four times a day (QID) | ORAL | 0 refills | Status: DC | PRN
  Filled 2024-01-09 – 2024-01-12 (×2): qty 180, 30d supply, fill #0

## 2024-01-11 ENCOUNTER — Other Ambulatory Visit (HOSPITAL_COMMUNITY): Payer: Self-pay

## 2024-01-11 ENCOUNTER — Other Ambulatory Visit: Payer: Self-pay

## 2024-01-11 DIAGNOSIS — Z79899 Other long term (current) drug therapy: Secondary | ICD-10-CM | POA: Diagnosis not present

## 2024-01-12 ENCOUNTER — Other Ambulatory Visit (HOSPITAL_COMMUNITY): Payer: Self-pay

## 2024-02-08 ENCOUNTER — Other Ambulatory Visit (HOSPITAL_COMMUNITY): Payer: Self-pay

## 2024-02-12 ENCOUNTER — Other Ambulatory Visit: Payer: Self-pay

## 2024-02-12 ENCOUNTER — Other Ambulatory Visit (HOSPITAL_COMMUNITY): Payer: Self-pay

## 2024-02-13 ENCOUNTER — Other Ambulatory Visit (HOSPITAL_COMMUNITY): Payer: Self-pay

## 2024-02-13 DIAGNOSIS — Z79899 Other long term (current) drug therapy: Secondary | ICD-10-CM | POA: Diagnosis not present

## 2024-02-13 DIAGNOSIS — M85852 Other specified disorders of bone density and structure, left thigh: Secondary | ICD-10-CM | POA: Diagnosis not present

## 2024-02-13 DIAGNOSIS — E669 Obesity, unspecified: Secondary | ICD-10-CM | POA: Diagnosis not present

## 2024-02-13 DIAGNOSIS — M129 Arthropathy, unspecified: Secondary | ICD-10-CM | POA: Diagnosis not present

## 2024-02-13 DIAGNOSIS — E559 Vitamin D deficiency, unspecified: Secondary | ICD-10-CM | POA: Diagnosis not present

## 2024-02-13 DIAGNOSIS — M159 Polyosteoarthritis, unspecified: Secondary | ICD-10-CM | POA: Diagnosis not present

## 2024-02-13 DIAGNOSIS — G8929 Other chronic pain: Secondary | ICD-10-CM | POA: Diagnosis not present

## 2024-02-13 MED ORDER — OXYCODONE-ACETAMINOPHEN 10-325 MG PO TABS
1.5000 | ORAL_TABLET | Freq: Four times a day (QID) | ORAL | 0 refills | Status: DC | PRN
  Filled 2024-02-13: qty 180, 30d supply, fill #0

## 2024-02-15 DIAGNOSIS — Z79899 Other long term (current) drug therapy: Secondary | ICD-10-CM | POA: Diagnosis not present

## 2024-03-05 ENCOUNTER — Other Ambulatory Visit: Payer: Self-pay | Admitting: Family Medicine

## 2024-03-05 ENCOUNTER — Telehealth: Payer: Self-pay

## 2024-03-05 ENCOUNTER — Other Ambulatory Visit (HOSPITAL_COMMUNITY): Payer: Self-pay

## 2024-03-05 ENCOUNTER — Other Ambulatory Visit: Payer: Self-pay

## 2024-03-05 MED ORDER — ZEPBOUND 15 MG/0.5ML ~~LOC~~ SOAJ
15.0000 mg | SUBCUTANEOUS | 1 refills | Status: DC
  Filled 2024-03-05: qty 2, 28d supply, fill #0
  Filled 2024-04-01: qty 2, 28d supply, fill #1

## 2024-03-05 NOTE — Telephone Encounter (Signed)
 Copied from CRM [id number]. Topic: General - Other >> Mar 05, 2024  1:26 PM Rosina BIRCH wrote: Reason for CRM: patient called stating she need to increase her zepbound  to 15mg  [phone]   Is this ok to send in

## 2024-03-06 ENCOUNTER — Other Ambulatory Visit (HOSPITAL_COMMUNITY): Payer: Self-pay

## 2024-03-06 DIAGNOSIS — N952 Postmenopausal atrophic vaginitis: Secondary | ICD-10-CM | POA: Diagnosis not present

## 2024-03-06 DIAGNOSIS — Z6839 Body mass index (BMI) 39.0-39.9, adult: Secondary | ICD-10-CM | POA: Diagnosis not present

## 2024-03-06 DIAGNOSIS — R87610 Atypical squamous cells of undetermined significance on cytologic smear of cervix (ASC-US): Secondary | ICD-10-CM | POA: Diagnosis not present

## 2024-03-06 DIAGNOSIS — Z124 Encounter for screening for malignant neoplasm of cervix: Secondary | ICD-10-CM | POA: Diagnosis not present

## 2024-03-13 ENCOUNTER — Other Ambulatory Visit (HOSPITAL_COMMUNITY): Payer: Self-pay

## 2024-03-13 DIAGNOSIS — E6609 Other obesity due to excess calories: Secondary | ICD-10-CM | POA: Diagnosis not present

## 2024-03-13 DIAGNOSIS — Z6838 Body mass index (BMI) 38.0-38.9, adult: Secondary | ICD-10-CM | POA: Diagnosis not present

## 2024-03-13 DIAGNOSIS — Z79899 Other long term (current) drug therapy: Secondary | ICD-10-CM | POA: Diagnosis not present

## 2024-03-13 DIAGNOSIS — M159 Polyosteoarthritis, unspecified: Secondary | ICD-10-CM | POA: Diagnosis not present

## 2024-03-13 DIAGNOSIS — M25552 Pain in left hip: Secondary | ICD-10-CM | POA: Diagnosis not present

## 2024-03-13 DIAGNOSIS — M25551 Pain in right hip: Secondary | ICD-10-CM | POA: Diagnosis not present

## 2024-03-13 MED ORDER — OXYCODONE-ACETAMINOPHEN 10-325 MG PO TABS
1.5000 | ORAL_TABLET | Freq: Four times a day (QID) | ORAL | 0 refills | Status: AC | PRN
  Filled 2024-03-13: qty 180, 30d supply, fill #0

## 2024-03-25 ENCOUNTER — Encounter: Payer: Self-pay | Admitting: Family Medicine

## 2024-03-27 ENCOUNTER — Ambulatory Visit: Admitting: Dermatology

## 2024-04-04 ENCOUNTER — Encounter: Payer: Self-pay | Admitting: Family Medicine

## 2024-04-04 ENCOUNTER — Other Ambulatory Visit: Payer: Self-pay

## 2024-04-04 ENCOUNTER — Other Ambulatory Visit (HOSPITAL_COMMUNITY): Payer: Self-pay

## 2024-04-04 ENCOUNTER — Ambulatory Visit (INDEPENDENT_AMBULATORY_CARE_PROVIDER_SITE_OTHER): Admitting: Family Medicine

## 2024-04-04 VITALS — BP 122/74 | HR 80 | Temp 97.2°F | Ht 63.0 in | Wt 230.0 lb

## 2024-04-04 DIAGNOSIS — J4520 Mild intermittent asthma, uncomplicated: Secondary | ICD-10-CM | POA: Diagnosis not present

## 2024-04-04 DIAGNOSIS — E7800 Pure hypercholesterolemia, unspecified: Secondary | ICD-10-CM

## 2024-04-04 DIAGNOSIS — G4700 Insomnia, unspecified: Secondary | ICD-10-CM | POA: Diagnosis not present

## 2024-04-04 DIAGNOSIS — K5904 Chronic idiopathic constipation: Secondary | ICD-10-CM

## 2024-04-04 DIAGNOSIS — F5101 Primary insomnia: Secondary | ICD-10-CM | POA: Diagnosis not present

## 2024-04-04 MED ORDER — ATORVASTATIN CALCIUM 20 MG PO TABS
20.0000 mg | ORAL_TABLET | Freq: Every day | ORAL | 1 refills | Status: AC
  Filled 2024-04-04: qty 90, 90d supply, fill #0

## 2024-04-04 MED ORDER — MIRTAZAPINE 30 MG PO TABS: 30.0000 mg | ORAL_TABLET | Freq: Every day | ORAL | 1 refills | Status: AC

## 2024-04-04 MED ORDER — ZEPBOUND 15 MG/0.5ML ~~LOC~~ SOAJ
15.0000 mg | SUBCUTANEOUS | 2 refills | Status: AC
  Filled 2024-05-03: qty 2, 28d supply, fill #0
  Filled 2024-05-27: qty 2, 28d supply, fill #1

## 2024-04-04 MED ORDER — HYDROXYZINE HCL 10 MG PO TABS
10.0000 mg | ORAL_TABLET | Freq: Every evening | ORAL | 3 refills | Status: AC
  Filled 2024-04-04: qty 30, 30d supply, fill #0
  Filled 2024-05-03: qty 30, 30d supply, fill #1

## 2024-04-04 MED ORDER — AIRSUPRA 90-80 MCG/ACT IN AERO
2.0000 | INHALATION_SPRAY | Freq: Four times a day (QID) | RESPIRATORY_TRACT | 1 refills | Status: AC | PRN
  Filled 2024-04-04: qty 10.7, 30d supply, fill #0
  Filled 2024-05-03: qty 10.7, 30d supply, fill #1

## 2024-04-04 NOTE — Progress Notes (Signed)
 Subjective:     Patient ID: Sandra Bird, female    DOB: 1970-08-03, 53 y.o.   MRN: [id number]  Chief Complaint  Patient presents with   Hyperlipidemia    Pt is here for chronic issues    Discussed the use of AI scribe software for clinical note transcription with the patient, who gave verbal consent to proceed.  History of Present Illness Sandra Bird is a 53 year old female who presents for follow-up on cholesterol and weight management.  She has experienced significant weight loss,(256 1 yr ag) losing over 30 pounds, which has positively impacted her knee pain. Despite cravings for sweets, she finds herself unable to finish meals, such as burgers, which she used to enjoy. She attributes this improvement to both weight loss and her current medication regimen, including Humira.  She reports a recent asthma flare-up characterized by chest tightness and a dry cough. She could not find her inhaler, which she thought was prescribed by a former doctor. Asthma flares occur once or twice a year, and she recalls that prednisone  has been effective in managing past flares. She has been using leftover prednisone , which she found helpful in reducing symptoms.  She is currently taking atorvastatin  for cholesterol management and needs a refill. She also uses hydroxyzine  as needed and requests a refill. She is on Linzess  with one refill remaining and takes mirtazapine  for sleep, which she reports is effective. She is on the highest dose of Zepbound , which she feels suppresses her appetite.  Her cholesterol levels have improved significantly, with her total cholesterol now at 166, down from 244, and her LDL at 88. Her A1c has also improved, now at 5.7, down from 6.0 in February.  She has been staying active, especially with the demands of caring for her grandchildren, including a set of twins born a month ago. She notes that the cold weather exacerbates her arthritis symptoms, but overall, she  feels that her health is improving. Improved knee pain.    There are no preventive care reminders to display for this patient.  Past Medical History:  Diagnosis Date   Arthritis    SVD (spontaneous vaginal delivery)    x 3    Past Surgical History:  Procedure Laterality Date   ABDOMINOPLASTY/PANNICULECTOMY     ARTERY BIOPSY Left 06/16/2023   Procedure: BIOPSY TEMPORAL ARTERY;  Surgeon: Lanis Fonda BRAVO, MD;  Location: [facility] OR;  Service: Vascular;  Laterality: Left;   CHOLECYSTECTOMY     DILITATION & CURRETTAGE/HYSTROSCOPY WITH NOVASURE ABLATION N/A 07/25/2013   Procedure: DILATATION & CURETTAGE/HYSTEROSCOPY WITH NOVASURE ABLATION;  Surgeon: Peggye Gull, MD;  Location: [facility] ORS;  Service: Gynecology;  Laterality: N/A;   TONSILLECTOMY     TOTAL HIP ARTHROPLASTY Right 03/24/2020   Procedure: RIGHT TOTAL HIP ARTHROPLASTY ANTERIOR APPROACH;  Surgeon: Sheril Coy, MD;  Location: [facility] ORS;  Service: Orthopedics;  Laterality: Right;   TUBAL LIGATION     WISDOM TOOTH EXTRACTION      Current Medications[1]  Allergies[2] ROS neg/noncontributory except as noted HPI/below      Objective:     BP 122/74 (BP Location: Left Arm, Patient Position: Sitting, Cuff Size: Normal)   Pulse 80   Temp (!) 97.2 F (36.2 C) (Temporal)   Ht 5' 3 (1.6 m)   Wt 230 lb (104.3 kg)   SpO2 98%   BMI 40.74 kg/m  Wt Readings from Last 3 Encounters:  04/04/24 230 lb (104.3 kg)  12/26/23 234 lb 8 oz (  106.4 kg)  09/21/23 248 lb 9.6 oz (112.8 kg)    Physical Exam GENERAL: Well developed well nourished no acute distress HEAD EYES EARS NOSE THROAT: Normocephalic atraumatic, conjunctiva not injected, sclera nonicteric CARDIAC: Regular rate and rhythm, S1 S2 present , no murmur, dorsalis pedis 2 plus bilaterally NECK: Supple, no thyromegaly, no nodes, no carotid bruits LUNGS: Clear to auscultation bilaterally, no wheezes ABDOMEN: Bowel sounds present, soft, non tender non distended, no hepatosplenomegaly,  no masses EXTREMITIES: No edema MUSCULOSKELETAL: No gross abnormalities NEUROLOGICAL: Alert and oriented x3, cranial nerves II through XII intact PSYCHIATRIC: Normal mood, good eye contact       Assessment & Plan:  Hypercholesteremia -     Atorvastatin  Calcium ; Take 1 tablet (20 mg total) by mouth daily.  Dispense: 90 tablet; Refill: 1  Chronic idiopathic constipation  Mild intermittent asthma without complication -     Airsupra; Inhale 2 puffs into the lungs every 6 (six) hours as needed.  Dispense: 10.7 g; Refill: 1  Insomnia, unspecified type -     hydrOXYzine  HCl; Take 1 tablet (10 mg total) by mouth at bedtime.  Dispense: 30 tablet; Refill: 3  Primary insomnia -     Mirtazapine ; Take 1 tablet (30 mg total) by mouth at bedtime.  Dispense: 90 tablet; Refill: 1  Other orders -     Zepbound ; Inject 15 mg into the skin once a week.  Dispense: 2 mL; Refill: 2    Assessment and Plan Assessment & Plan Hypercholesterolemia   Cholesterol levels have improved significantly with atorvastatin  and weight loss. Total cholesterol is 166 mg/dL, LDL is 88 mg/dL, and triglycerides are 85 mg/dL, indicating effective management. Continue atorvastatin  as prescribed.  Obesity   She has achieved significant weight loss, approximately 30 pounds from an initial weight of 260 pounds(per pt, per her scale, much more). The current weight loss plateau may be due to insufficient caloric intake. Zepbound  is at the maximum dose of 15 mg. Weight loss has improved knee pain and overall health. Continue Zepbound  at 15 mg. Encourage increased caloric intake to avoid metabolic slowdown and consider protein shakes to increase caloric intake without excessive weight gain.  Mild intermittent asthma   Asthma flares occur once or twice a year, with a recent flare involving chest tightness and dry cough. Previous use of prednisone  during flares was beneficial. Air Supra is preferred due to its combination of albuterol   and steroid, which may prevent further exacerbations. Insurance coverage may affect the choice between [employer] and plain albuterol . Prescribe Air Supra with instructions to use two puffs every four to six hours as needed. If [employer] is not covered by insurance, prescribe plain albuterol .  Chronic idiopathic constipation   Constipation is under good control with the current medication regimen. Continue the current medication regimen for constipation.  Primary insomnia   Mirtazapine  is effective for sleep. Continue mirtazapine  for sleep.     Return in about 3 months (around 07/03/2024) for cholesterol, wt.  Jenkins CHRISTELLA Carrel, MD     [1]  Current Outpatient Medications:    adalimumab (HUMIRA, 2 PEN,) 40 MG/0.4ML pen, Inject into the skin., Disp: , Rfl:    albuterol  (VENTOLIN  HFA) 108 (90 Base) MCG/ACT inhaler, Inhale 2 puffs into the lungs every 6 (six) hours as needed for wheezing or shortness of breath., Disp: 6.7 g, Rfl: 0   Albuterol -Budesonide (AIRSUPRA) 90-80 MCG/ACT AERO, Inhale 2 puffs into the lungs every 6 (six) hours as needed., Disp: 10.7 g, Rfl:  1   betamethasone  dipropionate 0.05 % cream, APPLY TO THE HANDS (RASH) TWICE DAILY AS NEEDED FOR TWO WEEKS THEN STOP, Disp: 45 g, Rfl: 3   cholecalciferol (VITAMIN D3) 25 MCG (1000 UNIT) tablet, 1 tablet Orally Once a Week, Disp: , Rfl:    clobetasol  cream (TEMOVATE ) 0.05 %, Apply topically 2 (two) times daily. Apply to affected areas of hands twice daily x 2 weeks., Disp: 60 g, Rfl: 1   cyclobenzaprine  (FLEXERIL ) 10 MG tablet, Take 1 tablet (10 mg total) by mouth at bedtime as needed for muscle spasms., Disp: 90 tablet, Rfl: 1   ibuprofen  (ADVIL ) 600 MG tablet, 1 tablet Orally Three times a day, Disp: , Rfl:    latanoprost  (XALATAN ) 0.005 % ophthalmic solution, Place 1 drop into both eyes at bedtime., Disp: 7.5 mL, Rfl: 3   linaclotide  (LINZESS ) 145 MCG CAPS capsule, Take 1 capsule (145 mcg total) by mouth daily. Before the first meal of  the day on an empty stomach., Disp: 30 capsule, Rfl: 6   Multiple Vitamins-Minerals (MULTIVITAMIN WITH MINERALS) tablet, Take 1 tablet by mouth daily., Disp: , Rfl:    naloxone  (NARCAN ) nasal spray 4 mg/0.1 mL, 1 spray every 3 minutes; spray 1 dose into ONE nostril; alternate nostrils with each dose until help arrives, Disp: 2 each, Rfl: 1   naloxone  (NARCAN ) nasal spray 4 mg/0.1 mL, Use 1 spray every 3 minutes; spray 1 dose into ONE nostril; alternate nostrils with each dose until help arrives, Disp: 1 each, Rfl: 3   ondansetron  (ZOFRAN ) 4 MG tablet, Take 2 tablets (8 mg total) by mouth 2 to 3 times per day as needed for nausea and vomiting, Disp: 42 tablet, Rfl: 0   oxyCODONE -acetaminophen  (PERCOCET) 10-325 MG tablet, Take 1 (one) tablet by mouth four times daily, as needed (Patient taking differently: Take 1 tablet by mouth 3 (three) times daily.), Disp: 120 tablet, Rfl: 0   oxyCODONE -acetaminophen  (PERCOCET) 10-325 MG tablet, Take 1.5 tablets by mouth 4 (four) times daily as needed for pain, Disp: 180 tablet, Rfl: 0   oxyCODONE -acetaminophen  (PERCOCET) 10-325 MG tablet, Take 1.5 tablets by mouth 4 (four) times daily as needed for pain, Disp: 180 tablet, Rfl: 0   oxyCODONE -acetaminophen  (PERCOCET) 10-325 MG tablet, Take 1.5 tablets by mouth 4 (four) times daily as needed for pain, Disp: 180 tablet, Rfl: 0   oxyCODONE -acetaminophen  (PERCOCET) 10-325 MG tablet, Take 1.5 tablets by mouth 4 (four) times daily as needed for pain, Disp: 180 tablet, Rfl: 0   oxyCODONE -acetaminophen  (PERCOCET) 10-325 MG tablet, Take 1 and 1/2 tablets by mouth 4 (four) times daily as needed for pain., Disp: 180 tablet, Rfl: 0   pantoprazole  (PROTONIX ) 40 MG tablet, Take 1 tablet (40 mg total) by mouth daily., Disp: 90 tablet, Rfl: 1   Roflumilast  (ZORYVE ) 0.15 % CREA, Apply 1 Application topically daily., Disp: 60 g, Rfl: 9   Safety Seal Miscellaneous MISC, Apply 1 Application topically in the morning. Medication Name: AA Gel  (Minox 8% and Clobetasol  0.05%), Disp: 30 g, Rfl: 11   atorvastatin  (LIPITOR) 20 MG tablet, Take 1 tablet (20 mg total) by mouth daily., Disp: 90 tablet, Rfl: 1   hydrOXYzine  (ATARAX ) 10 MG tablet, Take 1 tablet (10 mg total) by mouth at bedtime., Disp: 30 tablet, Rfl: 3   mirtazapine  (REMERON ) 30 MG tablet, Take 1 tablet (30 mg total) by mouth at bedtime., Disp: 90 tablet, Rfl: 1   tirzepatide  (ZEPBOUND ) 15 MG/0.5ML Pen, Inject 15 mg into the skin once  a week., Disp: 2 mL, Rfl: 2 [2]  Allergies Allergen Reactions   Metronidazole  Nausea And Vomiting    Other Reaction(s): vomiting  Metronidazole  Patient stated no issues with this med    Penicillin G Rash   Penicillins Other (See Comments) and Rash    Has patient had a PCN reaction causing immediate rash, facial/tongue/throat swelling, SOB or lightheadedness with hypotension: Yes  Has patient had a PCN reaction causing severe rash involving mucus membranes or skin necrosis: No  Has patient had a PCN reaction that required hospitalization No  Has patient had a PCN reaction occurring within the last 10 years: No  If all of the above answers are NO, then may proceed with Cephalosporin use.

## 2024-04-04 NOTE — Patient Instructions (Signed)

## 2024-04-11 ENCOUNTER — Other Ambulatory Visit (HOSPITAL_COMMUNITY): Payer: Self-pay

## 2024-04-11 MED ORDER — OXYCODONE-ACETAMINOPHEN 10-325 MG PO TABS
1.5000 | ORAL_TABLET | Freq: Four times a day (QID) | ORAL | 0 refills | Status: AC
  Filled 2024-04-11: qty 180, 30d supply, fill #0

## 2024-04-11 MED ORDER — ONDANSETRON HCL 4 MG PO TABS
8.0000 mg | ORAL_TABLET | Freq: Three times a day (TID) | ORAL | 0 refills | Status: AC
  Filled 2024-04-11: qty 42, 7d supply, fill #0

## 2024-05-03 ENCOUNTER — Other Ambulatory Visit (HOSPITAL_COMMUNITY): Payer: Self-pay

## 2024-05-14 ENCOUNTER — Other Ambulatory Visit (HOSPITAL_COMMUNITY): Payer: Self-pay

## 2024-05-14 MED ORDER — OXYCODONE-ACETAMINOPHEN 10-325 MG PO TABS
1.5000 | ORAL_TABLET | Freq: Four times a day (QID) | ORAL | 0 refills | Status: AC | PRN
  Filled 2024-05-14: qty 180, 30d supply, fill #0

## 2024-05-14 MED ORDER — VITAMIN D (ERGOCALCIFEROL) 1.25 MG (50000 UNIT) PO CAPS
50000.0000 [IU] | ORAL_CAPSULE | ORAL | 1 refills | Status: AC
  Filled 2024-05-14: qty 12, 84d supply, fill #0
  Filled 2024-05-27: qty 12, 84d supply, fill #1

## 2024-05-27 ENCOUNTER — Other Ambulatory Visit (HOSPITAL_COMMUNITY): Payer: Self-pay

## 2024-05-27 ENCOUNTER — Other Ambulatory Visit: Payer: Self-pay

## 2024-05-27 ENCOUNTER — Other Ambulatory Visit: Payer: Self-pay | Admitting: Family Medicine

## 2024-05-27 DIAGNOSIS — K5904 Chronic idiopathic constipation: Secondary | ICD-10-CM

## 2024-05-27 MED ORDER — LINACLOTIDE 145 MCG PO CAPS
145.0000 ug | ORAL_CAPSULE | Freq: Every day | ORAL | 6 refills | Status: AC
  Filled 2024-05-27: qty 30, 30d supply, fill #0

## 2024-06-25 ENCOUNTER — Ambulatory Visit: Payer: Medicare PPO

## 2024-07-09 ENCOUNTER — Ambulatory Visit: Admitting: Family Medicine
